# Patient Record
Sex: Female | Born: 1970
Health system: Southern US, Community
[De-identification: ages and names within clinical notes are randomized; demographics above are authoritative.]

## PROBLEM LIST (undated history)

## (undated) DIAGNOSIS — J020 Streptococcal pharyngitis: Secondary | ICD-10-CM

## (undated) DIAGNOSIS — F329 Major depressive disorder, single episode, unspecified: Secondary | ICD-10-CM

## (undated) DIAGNOSIS — F419 Anxiety disorder, unspecified: Secondary | ICD-10-CM

## (undated) DIAGNOSIS — F32A Depression, unspecified: Secondary | ICD-10-CM

## (undated) DIAGNOSIS — K219 Gastro-esophageal reflux disease without esophagitis: Secondary | ICD-10-CM

## (undated) DIAGNOSIS — F1310 Sedative, hypnotic or anxiolytic abuse, uncomplicated: Secondary | ICD-10-CM

## (undated) DIAGNOSIS — M1990 Unspecified osteoarthritis, unspecified site: Secondary | ICD-10-CM

## (undated) DIAGNOSIS — T7840XA Allergy, unspecified, initial encounter: Secondary | ICD-10-CM

## (undated) DIAGNOSIS — I499 Cardiac arrhythmia, unspecified: Secondary | ICD-10-CM

## (undated) DIAGNOSIS — G709 Myoneural disorder, unspecified: Secondary | ICD-10-CM

## (undated) DIAGNOSIS — M797 Fibromyalgia: Secondary | ICD-10-CM

## (undated) HISTORY — PX: ABDOMINAL HYSTERECTOMY: SHX81

## (undated) HISTORY — DX: Streptococcal pharyngitis: J02.0

## (undated) HISTORY — DX: Depression, unspecified: F32.A

## (undated) HISTORY — DX: Myoneural disorder, unspecified: G70.9

## (undated) HISTORY — DX: Anxiety disorder, unspecified: F41.9

## (undated) HISTORY — DX: Allergy, unspecified, initial encounter: T78.40XA

## (undated) HISTORY — DX: Gastro-esophageal reflux disease without esophagitis: K21.9

## (undated) HISTORY — DX: Major depressive disorder, single episode, unspecified: F32.9

## (undated) HISTORY — PX: BACK SURGERY: SHX140

## (undated) HISTORY — PX: TUBAL LIGATION: SHX77

## (undated) HISTORY — PX: APPENDECTOMY: SHX54

## (undated) HISTORY — DX: Unspecified osteoarthritis, unspecified site: M19.90

---

## 2004-10-12 ENCOUNTER — Ambulatory Visit: Payer: Self-pay | Admitting: Unknown Physician Specialty

## 2005-02-13 ENCOUNTER — Ambulatory Visit: Payer: Self-pay | Admitting: Unknown Physician Specialty

## 2005-12-08 ENCOUNTER — Emergency Department: Payer: Self-pay | Admitting: Emergency Medicine

## 2005-12-14 ENCOUNTER — Ambulatory Visit: Payer: Self-pay | Admitting: General Surgery

## 2005-12-20 ENCOUNTER — Ambulatory Visit: Payer: Self-pay | Admitting: Unknown Physician Specialty

## 2006-01-13 ENCOUNTER — Ambulatory Visit: Payer: Self-pay

## 2008-10-12 ENCOUNTER — Ambulatory Visit: Payer: Self-pay | Admitting: Unknown Physician Specialty

## 2008-10-21 ENCOUNTER — Ambulatory Visit: Payer: Self-pay | Admitting: Unknown Physician Specialty

## 2009-03-04 ENCOUNTER — Ambulatory Visit: Payer: Self-pay | Admitting: Maternal and Fetal Medicine

## 2009-03-04 ENCOUNTER — Ambulatory Visit: Payer: Self-pay | Admitting: Oncology

## 2009-03-14 ENCOUNTER — Ambulatory Visit: Payer: Self-pay | Admitting: Maternal and Fetal Medicine

## 2009-03-14 ENCOUNTER — Ambulatory Visit: Payer: Self-pay | Admitting: Oncology

## 2009-07-04 ENCOUNTER — Ambulatory Visit: Payer: Self-pay | Admitting: Unknown Physician Specialty

## 2009-08-08 ENCOUNTER — Ambulatory Visit: Payer: Self-pay | Admitting: Unknown Physician Specialty

## 2009-08-11 ENCOUNTER — Ambulatory Visit: Payer: Self-pay | Admitting: Unknown Physician Specialty

## 2009-09-15 ENCOUNTER — Ambulatory Visit: Payer: Self-pay | Admitting: Unknown Physician Specialty

## 2009-09-26 ENCOUNTER — Ambulatory Visit: Payer: Self-pay | Admitting: Unknown Physician Specialty

## 2010-12-07 ENCOUNTER — Ambulatory Visit: Payer: Self-pay | Admitting: Unknown Physician Specialty

## 2011-01-17 ENCOUNTER — Ambulatory Visit: Payer: Self-pay | Admitting: Podiatry

## 2011-08-24 ENCOUNTER — Ambulatory Visit: Payer: Self-pay | Admitting: Rheumatology

## 2012-04-18 DIAGNOSIS — M797 Fibromyalgia: Secondary | ICD-10-CM | POA: Insufficient documentation

## 2013-06-03 DIAGNOSIS — F32A Depression, unspecified: Secondary | ICD-10-CM | POA: Insufficient documentation

## 2013-06-03 DIAGNOSIS — F329 Major depressive disorder, single episode, unspecified: Secondary | ICD-10-CM | POA: Insufficient documentation

## 2013-06-17 DIAGNOSIS — M792 Neuralgia and neuritis, unspecified: Secondary | ICD-10-CM | POA: Insufficient documentation

## 2013-07-02 ENCOUNTER — Emergency Department: Payer: Self-pay | Admitting: Emergency Medicine

## 2013-07-02 LAB — LIPASE, BLOOD: Lipase: 216 U/L (ref 73–393)

## 2013-07-02 LAB — COMPREHENSIVE METABOLIC PANEL
ALBUMIN: 3.6 g/dL (ref 3.4–5.0)
ALT: 120 U/L — AB (ref 12–78)
AST: 86 U/L — AB (ref 15–37)
Alkaline Phosphatase: 132 U/L — ABNORMAL HIGH
Anion Gap: 7 (ref 7–16)
BUN: 3 mg/dL — ABNORMAL LOW (ref 7–18)
Bilirubin,Total: 0.5 mg/dL (ref 0.2–1.0)
CHLORIDE: 102 mmol/L (ref 98–107)
Calcium, Total: 8.7 mg/dL (ref 8.5–10.1)
Co2: 30 mmol/L (ref 21–32)
Creatinine: 0.95 mg/dL (ref 0.60–1.30)
EGFR (African American): >60
EGFR (Non-African Amer.): >60
GLUCOSE: 135 mg/dL — AB (ref 65–99)
OSMOLALITY: 278 (ref 275–301)
Potassium: 2.8 mmol/L — ABNORMAL LOW (ref 3.5–5.1)
Sodium: 139 mmol/L (ref 136–145)
Total Protein: 7.4 g/dL (ref 6.4–8.2)

## 2013-07-02 LAB — CBC WITH DIFFERENTIAL/PLATELET
Basophil #: 0.1 10*3/uL (ref 0.0–0.1)
Basophil %: 0.8 %
Eosinophil #: 0.4 10*3/uL (ref 0.0–0.7)
Eosinophil %: 4.3 %
HCT: 38.5 % (ref 35.0–47.0)
HGB: 13 g/dL (ref 12.0–16.0)
Lymphocyte #: 3 10*3/uL (ref 1.0–3.6)
Lymphocyte %: 34.1 %
MCH: 30.4 pg (ref 26.0–34.0)
MCHC: 33.7 g/dL (ref 32.0–36.0)
MCV: 90 fL (ref 80–100)
Monocyte #: 1.1 x10 3/mm — ABNORMAL HIGH (ref 0.2–0.9)
Monocyte %: 12.2 %
NEUTROS PCT: 48.6 %
Neutrophil #: 4.3 10*3/uL (ref 1.4–6.5)
Platelet: 229 10*3/uL (ref 150–440)
RBC: 4.27 10*6/uL (ref 3.80–5.20)
RDW: 13 % (ref 11.5–14.5)
WBC: 8.8 10*3/uL (ref 3.6–11.0)

## 2013-07-02 LAB — URINALYSIS, COMPLETE
BLOOD: NEGATIVE
Bacteria: NONE SEEN
Bilirubin,UR: NEGATIVE
Glucose,UR: NEGATIVE mg/dL (ref 0–75)
KETONE: NEGATIVE
NITRITE: NEGATIVE
Ph: 6 (ref 4.5–8.0)
Protein: NEGATIVE
RBC,UR: 2 /HPF (ref 0–5)
Specific Gravity: 1.006 (ref 1.003–1.030)
Squamous Epithelial: 9
WBC UR: 4 /HPF (ref 0–5)

## 2013-07-02 LAB — TROPONIN I: Troponin-I: <0.02 ng/mL

## 2013-07-03 ENCOUNTER — Ambulatory Visit: Payer: Self-pay | Admitting: Orthopaedic Surgery

## 2013-07-06 DIAGNOSIS — S99929A Unspecified injury of unspecified foot, initial encounter: Secondary | ICD-10-CM | POA: Insufficient documentation

## 2013-07-08 ENCOUNTER — Other Ambulatory Visit: Payer: Self-pay | Admitting: Unknown Physician Specialty

## 2013-07-08 LAB — CLOSTRIDIUM DIFFICILE(ARMC)

## 2013-07-11 LAB — STOOL CULTURE

## 2013-07-14 DIAGNOSIS — G629 Polyneuropathy, unspecified: Secondary | ICD-10-CM | POA: Insufficient documentation

## 2013-07-16 ENCOUNTER — Ambulatory Visit: Payer: Self-pay | Admitting: Unknown Physician Specialty

## 2013-08-11 DIAGNOSIS — N809 Endometriosis, unspecified: Secondary | ICD-10-CM | POA: Insufficient documentation

## 2013-08-11 DIAGNOSIS — F4310 Post-traumatic stress disorder, unspecified: Secondary | ICD-10-CM | POA: Insufficient documentation

## 2013-08-11 DIAGNOSIS — K589 Irritable bowel syndrome without diarrhea: Secondary | ICD-10-CM | POA: Insufficient documentation

## 2013-09-07 DIAGNOSIS — M545 Low back pain, unspecified: Secondary | ICD-10-CM | POA: Insufficient documentation

## 2014-01-28 DIAGNOSIS — M47816 Spondylosis without myelopathy or radiculopathy, lumbar region: Secondary | ICD-10-CM | POA: Insufficient documentation

## 2014-05-04 DIAGNOSIS — R000 Tachycardia, unspecified: Secondary | ICD-10-CM | POA: Insufficient documentation

## 2014-05-06 DIAGNOSIS — R0681 Apnea, not elsewhere classified: Secondary | ICD-10-CM | POA: Insufficient documentation

## 2014-11-17 ENCOUNTER — Other Ambulatory Visit: Payer: Self-pay | Admitting: Orthopedic Surgery

## 2014-11-17 DIAGNOSIS — M533 Sacrococcygeal disorders, not elsewhere classified: Principal | ICD-10-CM

## 2014-11-17 DIAGNOSIS — G8929 Other chronic pain: Secondary | ICD-10-CM

## 2014-11-29 ENCOUNTER — Inpatient Hospital Stay: Admission: RE | Admit: 2014-11-29 | Payer: Self-pay | Source: Ambulatory Visit

## 2014-12-07 ENCOUNTER — Ambulatory Visit
Admission: RE | Admit: 2014-12-07 | Discharge: 2014-12-07 | Disposition: A | Discharge status: Home / Self Care | Payer: Worker's Compensation | Source: Ambulatory Visit | Attending: Orthopedic Surgery | Admitting: Orthopedic Surgery

## 2014-12-07 DIAGNOSIS — G8929 Other chronic pain: Secondary | ICD-10-CM

## 2014-12-07 DIAGNOSIS — M533 Sacrococcygeal disorders, not elsewhere classified: Principal | ICD-10-CM

## 2014-12-07 MED ORDER — METHYLPREDNISOLONE ACETATE 40 MG/ML INJ SUSP (RADIOLOG
120.0000 mg | Freq: Once | INTRAMUSCULAR | Status: AC
  Administered 2014-12-07: 120 mg via INTRA_ARTICULAR

## 2014-12-21 ENCOUNTER — Ambulatory Visit: Payer: BLUE CROSS/BLUE SHIELD | Attending: Pain Medicine | Admitting: Pain Medicine

## 2014-12-21 ENCOUNTER — Encounter: Payer: Self-pay | Admitting: Pain Medicine

## 2014-12-21 ENCOUNTER — Encounter (INDEPENDENT_AMBULATORY_CARE_PROVIDER_SITE_OTHER): Payer: Self-pay

## 2014-12-21 VITALS — BP 129/85 | HR 100 | Temp 98.3°F | Resp 16 | Ht 62.0 in | Wt 128.0 lb

## 2014-12-21 DIAGNOSIS — F419 Anxiety disorder, unspecified: Secondary | ICD-10-CM | POA: Diagnosis not present

## 2014-12-21 DIAGNOSIS — M5416 Radiculopathy, lumbar region: Secondary | ICD-10-CM | POA: Insufficient documentation

## 2014-12-21 DIAGNOSIS — K589 Irritable bowel syndrome without diarrhea: Secondary | ICD-10-CM | POA: Diagnosis not present

## 2014-12-21 DIAGNOSIS — G629 Polyneuropathy, unspecified: Secondary | ICD-10-CM

## 2014-12-21 DIAGNOSIS — M797 Fibromyalgia: Secondary | ICD-10-CM | POA: Diagnosis not present

## 2014-12-21 DIAGNOSIS — M419 Scoliosis, unspecified: Secondary | ICD-10-CM | POA: Diagnosis not present

## 2014-12-21 DIAGNOSIS — M5126 Other intervertebral disc displacement, lumbar region: Secondary | ICD-10-CM | POA: Diagnosis not present

## 2014-12-21 DIAGNOSIS — F17200 Nicotine dependence, unspecified, uncomplicated: Secondary | ICD-10-CM | POA: Insufficient documentation

## 2014-12-21 DIAGNOSIS — M47816 Spondylosis without myelopathy or radiculopathy, lumbar region: Secondary | ICD-10-CM

## 2014-12-21 DIAGNOSIS — K219 Gastro-esophageal reflux disease without esophagitis: Secondary | ICD-10-CM | POA: Diagnosis not present

## 2014-12-21 DIAGNOSIS — M51369 Other intervertebral disc degeneration, lumbar region without mention of lumbar back pain or lower extremity pain: Secondary | ICD-10-CM | POA: Insufficient documentation

## 2014-12-21 DIAGNOSIS — M79604 Pain in right leg: Secondary | ICD-10-CM | POA: Diagnosis present

## 2014-12-21 DIAGNOSIS — M545 Low back pain: Secondary | ICD-10-CM | POA: Diagnosis present

## 2014-12-21 DIAGNOSIS — R5382 Chronic fatigue, unspecified: Secondary | ICD-10-CM | POA: Insufficient documentation

## 2014-12-21 DIAGNOSIS — M533 Sacrococcygeal disorders, not elsewhere classified: Secondary | ICD-10-CM | POA: Diagnosis not present

## 2014-12-21 DIAGNOSIS — M5136 Other intervertebral disc degeneration, lumbar region: Secondary | ICD-10-CM

## 2014-12-21 DIAGNOSIS — M79605 Pain in left leg: Secondary | ICD-10-CM | POA: Diagnosis present
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
## 2014-12-21 NOTE — Progress Notes (Signed)
Discharged to home , ambulatory.  Pre procedure instructions given with teach back 3 done. 

## 2014-12-21 NOTE — Progress Notes (Signed)
Safety precautions to be maintained throughout the outpatient stay will include: orient to surroundings, keep bed in low position, maintain call bell within reach at all times, provide assistance with transfer out of bed and ambulation.  

## 2014-12-21 NOTE — Patient Instructions (Addendum)
Continue present medications  Lumbar facet, medial branch nerve, blocks to be performed at time of return appointment  F/U PCP for evaliation of  BP and general medical  condition  F/U surgical evaluation. We will consider surgical evaluation pending further evaluation and response to treatment  F/U neurological evaluation  May consider radiofrequency rhizolysis or intraspinal procedures pending response to present treatment and F/U evaluation as discussed we will consider performing radiofrequency rhizolysis of the lumbar facets, medial branch nerves.  Patient to call Pain Management Center should patient have concerns prior to scheduled return appointmen. GENERAL RISKS AND COMPLICATIONS  What are the risk, side effects and possible complications? Generally speaking, most procedures are safe.  However, with any procedure there are risks, side effects, and the possibility of complications.  The risks and complications are dependent upon the sites that are lesioned, or the type of nerve block to be performed.  The closer the procedure is to the spine, the more serious the risks are.  Great care is taken when placing the radio frequency needles, block needles or lesioning probes, but sometimes complications can occur. 1. Infection: Any time there is an injection through the skin, there is a risk of infection.  This is why sterile conditions are used for these blocks.  There are four possible types of infection. 1. Localized skin infection. 2. Central Nervous System Infection-This can be in the form of Meningitis, which can be deadly. 3. Epidural Infections-This can be in the form of an epidural abscess, which can cause pressure inside of the spine, causing compression of the spinal cord with subsequent paralysis. This would require an emergency surgery to decompress, and there are no guarantees that the patient would recover from the paralysis. 4. Discitis-This is an infection of the intervertebral  discs.  It occurs in about 1% of discography procedures.  It is difficult to treat and it may lead to surgery.        2. Pain: the needles have to go through skin and soft tissues, will cause soreness.       3. Damage to internal structures:  The nerves to be lesioned may be near blood vessels or    other nerves which can be potentially damaged.       4. Bleeding: Bleeding is more common if the patient is taking blood thinners such as  aspirin, Coumadin, Ticiid, Plavix, etc., or if he/she have some genetic predisposition  such as hemophilia. Bleeding into the spinal canal can cause compression of the spinal  cord with subsequent paralysis.  This would require an emergency surgery to  decompress and there are no guarantees that the patient would recover from the  paralysis.       5. Pneumothorax:  Puncturing of a lung is a possibility, every time a needle is introduced in  the area of the chest or upper back.  Pneumothorax refers to free air around the  collapsed lung(s), inside of the thoracic cavity (chest cavity).  Another two possible  complications related to a similar event would include: Hemothorax and Chylothorax.   These are variations of the Pneumothorax, where instead of air around the collapsed  lung(s), you may have blood or chyle, respectively.       6. Spinal headaches: They may occur with any procedures in the area of the spine.       7. Persistent CSF (Cerebro-Spinal Fluid) leakage: This is a rare problem, but may occur  with prolonged intrathecal or epidural catheters either  due to the formation of a fistulous  track or a dural tear.       8. Nerve damage: By working so close to the spinal cord, there is always a possibility of  nerve damage, which could be as serious as a permanent spinal cord injury with  paralysis.       9. Death:  Although rare, severe deadly allergic reactions known as "Anaphylactic  reaction" can occur to any of the medications used.      10. Worsening of the  symptoms:  We can always make thing worse.  What are the chances of something like this happening? Chances of any of this occuring are extremely low.  By statistics, you have more of a chance of getting killed in a motor vehicle accident: while driving to the hospital than any of the above occurring .  Nevertheless, you should be aware that they are possibilities.  In general, it is similar to taking a shower.  Everybody knows that you can slip, hit your head and get killed.  Does that mean that you should not shower again?  Nevertheless always keep in mind that statistics do not mean anything if you happen to be on the wrong side of them.  Even if a procedure has a 1 (one) in a 1,000,000 (million) chance of going wrong, it you happen to be that one..Also, keep in mind that by statistics, you have more of a chance of having something go wrong when taking medications.  Who should not have this procedure? If you are on a blood thinning medication (e.g. Coumadin, Plavix, see list of "Blood Thinners"), or if you have an active infection going on, you should not have the procedure.  If you are taking any blood thinners, please inform your physician.  How should I prepare for this procedure?  Do not eat or drink anything at least six hours prior to the procedure.  Bring a driver with you .  It cannot be a taxi.  Come accompanied by an adult that can drive you back, and that is strong enough to help you if your legs get weak or numb from the local anesthetic.  Take all of your medicines the morning of the procedure with just enough water to swallow them.  If you have diabetes, make sure that you are scheduled to have your procedure done first thing in the morning, whenever possible.  If you have diabetes, take only half of your insulin dose and notify our nurse that you have done so as soon as you arrive at the clinic.  If you are diabetic, but only take blood sugar pills (oral hypoglycemic), then do  not take them on the morning of your procedure.  You may take them after you have had the procedure.  Do not take aspirin or any aspirin-containing medications, at least eleven (11) days prior to the procedure.  They may prolong bleeding.  Wear loose fitting clothing that may be easy to take off and that you would not mind if it got stained with Betadine or blood.  Do not wear any jewelry or perfume  Remove any nail coloring.  It will interfere with some of our monitoring equipment.  NOTE: Remember that this is not meant to be interpreted as a complete list of all possible complications.  Unforeseen problems may occur.  BLOOD THINNERS The following drugs contain aspirin or other products, which can cause increased bleeding during surgery and should not be taken for 2  weeks prior to and 1 week after surgery.  If you should need take something for relief of minor pain, you may take acetaminophen which is found in Tylenol,m Datril, Anacin-3 and Panadol. It is not blood thinner. The products listed below are.  Do not take any of the products listed below in addition to any listed on your instruction sheet.  A.P.C or A.P.C with Codeine Codeine Phosphate Capsules #3 Ibuprofen Ridaura  ABC compound Congesprin Imuran rimadil  Advil Cope Indocin Robaxisal  Alka-Seltzer Effervescent Pain Reliever and Antacid Coricidin or Coricidin-D  Indomethacin Rufen  Alka-Seltzer plus Cold Medicine Cosprin Ketoprofen S-A-C Tablets  Anacin Analgesic Tablets or Capsules Coumadin Korlgesic Salflex  Anacin Extra Strength Analgesic tablets or capsules CP-2 Tablets Lanoril Salicylate  Anaprox Cuprimine Capsules Levenox Salocol  Anexsia-D Dalteparin Magan Salsalate  Anodynos Darvon compound Magnesium Salicylate Sine-off  Ansaid Dasin Capsules Magsal Sodium Salicylate  Anturane Depen Capsules Marnal Soma  APF Arthritis pain formula Dewitt's Pills Measurin Stanback  Argesic Dia-Gesic Meclofenamic Sulfinpyrazone   Arthritis Bayer Timed Release Aspirin Diclofenac Meclomen Sulindac  Arthritis pain formula Anacin Dicumarol Medipren Supac  Analgesic (Safety coated) Arthralgen Diffunasal Mefanamic Suprofen  Arthritis Strength Bufferin Dihydrocodeine Mepro Compound Suprol  Arthropan liquid Dopirydamole Methcarbomol with Aspirin Synalgos  ASA tablets/Enseals Disalcid Micrainin Tagament  Ascriptin Doan's Midol Talwin  Ascriptin A/D Dolene Mobidin Tanderil  Ascriptin Extra Strength Dolobid Moblgesic Ticlid  Ascriptin with Codeine Doloprin or Doloprin with Codeine Momentum Tolectin  Asperbuf Duoprin Mono-gesic Trendar  Aspergum Duradyne Motrin or Motrin IB Triminicin  Aspirin plain, buffered or enteric coated Durasal Myochrisine Trigesic  Aspirin Suppositories Easprin Nalfon Trillsate  Aspirin with Codeine Ecotrin Regular or Extra Strength Naprosyn Uracel  Atromid-S Efficin Naproxen Ursinus  Auranofin Capsules Elmiron Neocylate Vanquish  Axotal Emagrin Norgesic Verin  Azathioprine Empirin or Empirin with Codeine Normiflo Vitamin E  Azolid Emprazil Nuprin Voltaren  Bayer Aspirin plain, buffered or children's or timed BC Tablets or powders Encaprin Orgaran Warfarin Sodium  Buff-a-Comp Enoxaparin Orudis Zorpin  Buff-a-Comp with Codeine Equegesic Os-Cal-Gesic   Buffaprin Excedrin plain, buffered or Extra Strength Oxalid   Bufferin Arthritis Strength Feldene Oxphenbutazone   Bufferin plain or Extra Strength Feldene Capsules Oxycodone with Aspirin   Bufferin with Codeine Fenoprofen Fenoprofen Pabalate or Pabalate-SF   Buffets II Flogesic Panagesic   Buffinol plain or Extra Strength Florinal or Florinal with Codeine Panwarfarin   Buf-Tabs Flurbiprofen Penicillamine   Butalbital Compound Four-way cold tablets Penicillin   Butazolidin Fragmin Pepto-Bismol   Carbenicillin Geminisyn Percodan   Carna Arthritis Reliever Geopen Persantine   Carprofen Gold's salt Persistin   Chloramphenicol Goody's Phenylbutazone    Chloromycetin Haltrain Piroxlcam   Clmetidine heparin Plaquenil   Cllnoril Hyco-pap Ponstel   Clofibrate Hydroxy chloroquine Propoxyphen         Before stopping any of these medications, be sure to consult the physician who ordered them.  Some, such as Coumadin (Warfarin) are ordered to prevent or treat serious conditions such as "deep thrombosis", "pumonary embolisms", and other heart problems.  The amount of time that you may need off of the medication may also vary with the medication and the reason for which you were taking it.  If you are taking any of these medications, please make sure you notify your pain physician before you undergo any procedures.         Facet Joint Block The facet joints connect the bones of the spine (vertebrae). They make it possible for you to bend, twist, and make other  movements with your spine. They also prevent you from overbending, overtwisting, and making other excessive movements.  A facet joint block is a procedure where a numbing medicine (anesthetic) is injected into a facet joint. Often, a type of anti-inflammatory medicine called a steroid is also injected. A facet joint block may be done for two reasons:  2. Diagnosis. A facet joint block may be done as a test to see whether neck or back pain is caused by a worn-down or infected facet joint. If the pain gets better after a facet joint block, it means the pain is probably coming from the facet joint. If the pain does not get better, it means the pain is probably not coming from the facet joint.  3. Therapy. A facet joint block may be done to relieve neck or back pain caused by a facet joint. A facet joint block is only done as a therapy if the pain does not improve with medicine, exercise programs, physical therapy, and other forms of pain management. LET [facility] CARE PROVIDER KNOW ABOUT:   Any allergies you have.   All medicines you are taking, including vitamins, herbs, eyedrops, and  over-the-counter medicines and creams.   Previous problems you or members of your family have had with the use of anesthetics.   Any blood disorders you have had.   Other health problems you have. RISKS AND COMPLICATIONS Generally, having a facet joint block is safe. However, as with any procedure, complications can occur. Possible complications associated with having a facet joint block include:   Bleeding.   Injury to a nerve near the injection site.   Pain at the injection site.   Weakness or numbness in areas controlled by nerves near the injection site.   Infection.   Temporary fluid retention.   Allergic reaction to anesthetics or medicines used during the procedure. BEFORE THE PROCEDURE   Follow your health care provider's instructions if you are taking dietary supplements or medicines. You may need to stop taking them or reduce your dosage.   Do not take any new dietary supplements or medicines without asking your health care provider first.   Follow your health care provider's instructions about eating and drinking before the procedure. You may need to stop eating and drinking several hours before the procedure.   Arrange to have an adult drive you home after the procedure. PROCEDURE 12. You may need to remove your clothing and dress in an open-back gown so that your health care provider can access your spine.  13. The procedure will be done while you are lying on an X-ray table. Most of the time you will be asked to lie on your stomach, but you may be asked to lie in a different position if an injection will be made in your neck.  14. Special machines will be used to monitor your oxygen levels, heart rate, and blood pressure.  15. If an injection will be made in your neck, an intravenous (IV) tube will be inserted into one of your veins. Fluids and medicine will flow directly into your body through the IV tube.  16. The area over the facet joint where  the injection will be made will be cleaned with an antiseptic soap. The surrounding skin will be covered with sterile drapes.  17. An anesthetic will be applied to your skin to make the injection area numb. You may feel a temporary stinging or burning sensation.  18. A video X-ray machine will be used  to locate the joint. A contrast dye may be injected into the facet joint area to help with locating the joint.  19. When the joint is located, an anesthetic medicine will be injected into the joint through the needle.  20. Your health care provider will ask you whether you feel pain relief. If you do feel relief, a steroid may be injected to provide pain relief for a longer period of time. If you do not feel relief or feel only partial relief, additional injections of an anesthetic may be made in other facet joints.  21. The needle will be removed, the skin will be cleansed, and bandages will be applied.  AFTER THE PROCEDURE   You will be observed for 15-30 minutes before being allowed to go home. Do not drive. Have an adult drive you or take a taxi or public transportation instead.   If you feel pain relief, the pain will return in several hours or days when the anesthetic wears off.   You may feel pain relief 2-14 days after the procedure. The amount of time this relief lasts varies from person to person.   It is normal to feel some tenderness over the injected area(s) for 2 days following the procedure.   If you have diabetes, you may have a temporary increase in blood sugar. Document Released: 09/19/2006 Document Revised: 09/14/2013 Document Reviewed: 02/18/2012 [facility] Patient Information 2015 [place], [place]. This information is not intended to replace advice given to you by your health care provider. Make sure you discuss any questions you have with your health care provider.

## 2015-01-03 ENCOUNTER — Ambulatory Visit: Payer: BLUE CROSS/BLUE SHIELD | Attending: Pain Medicine | Admitting: Pain Medicine

## 2015-01-03 ENCOUNTER — Encounter: Payer: Self-pay | Admitting: Pain Medicine

## 2015-01-03 VITALS — BP 118/74 | HR 92 | Temp 98.4°F | Resp 18 | Ht 62.0 in | Wt 128.0 lb

## 2015-01-03 DIAGNOSIS — M79605 Pain in left leg: Secondary | ICD-10-CM | POA: Diagnosis present

## 2015-01-03 DIAGNOSIS — M545 Low back pain: Secondary | ICD-10-CM | POA: Diagnosis present

## 2015-01-03 DIAGNOSIS — M533 Sacrococcygeal disorders, not elsewhere classified: Secondary | ICD-10-CM

## 2015-01-03 DIAGNOSIS — M47816 Spondylosis without myelopathy or radiculopathy, lumbar region: Secondary | ICD-10-CM

## 2015-01-03 DIAGNOSIS — M5126 Other intervertebral disc displacement, lumbar region: Secondary | ICD-10-CM | POA: Diagnosis not present

## 2015-01-03 DIAGNOSIS — M5136 Other intervertebral disc degeneration, lumbar region: Secondary | ICD-10-CM | POA: Diagnosis not present

## 2015-01-03 DIAGNOSIS — M5416 Radiculopathy, lumbar region: Secondary | ICD-10-CM

## 2015-01-03 DIAGNOSIS — M79604 Pain in right leg: Secondary | ICD-10-CM | POA: Diagnosis present

## 2015-01-03 MED ORDER — KETOROLAC TROMETHAMINE 60 MG/2ML IM SOLN
INTRAMUSCULAR | Status: AC
  Administered 2015-01-03: 14:00:00
  Filled 2015-01-03: qty 2

## 2015-01-03 MED ORDER — MIDAZOLAM HCL 5 MG/5ML IJ SOLN
INTRAMUSCULAR | Status: AC
  Administered 2015-01-03: 5 mg via INTRAVENOUS
  Filled 2015-01-03: qty 5

## 2015-01-03 MED ORDER — TRIAMCINOLONE ACETONIDE 40 MG/ML IJ SUSP
INTRAMUSCULAR | Status: AC
  Administered 2015-01-03: 13:00:00
  Filled 2015-01-03: qty 1

## 2015-01-03 MED ORDER — ORPHENADRINE CITRATE 30 MG/ML IJ SOLN
INTRAMUSCULAR | Status: AC
  Administered 2015-01-03: 13:00:00
  Filled 2015-01-03: qty 2

## 2015-01-03 MED ORDER — FENTANYL CITRATE (PF) 100 MCG/2ML IJ SOLN
INTRAMUSCULAR | Status: AC
  Administered 2015-01-03: 100 ug via INTRAVENOUS
  Filled 2015-01-03: qty 2

## 2015-01-03 MED ORDER — BUPIVACAINE HCL (PF) 0.5 % IJ SOLN
INTRAMUSCULAR | Status: AC
Start: 2015-01-03 — End: 2015-01-03
  Administered 2015-01-03: 14:00:00
  Filled 2015-01-03: qty 30

## 2015-01-03 MED ORDER — BUPIVACAINE HCL (PF) 0.25 % IJ SOLN
INTRAMUSCULAR | Status: AC
  Administered 2015-01-03: 13:00:00
  Filled 2015-01-03: qty 30

## 2015-01-03 NOTE — Progress Notes (Signed)
Subjective:    Patient ID: Monique Castro, female    DOB: 10/29/1970, 44 y.o.   MRN: [id number]  HPI  PROCEDURE PERFORMED: Lumbar facet (medial branch block)   NOTE: The patient is a 44 y.o. female who [facility] for further evaluation and treatment of pain involving the lumbar and lower extremity region.  MRI  revealed the patient to be with evidence of degenerative disc disease lumbar spine Small focal extraforaminal disc protrusion on the left at L4-5 potentially irritating the left L4 nerve root. Focal central disc protrusion at L5-S1 possibly irritating both S1 nerve roots. Period there is concern regarding significant component of patient's pain being due to facet arthropathy with facet syndrome The risks, benefits, and expectations of the procedure have been discussed and explained to the patient who was understanding and in agreement with suggested treatment plan. We will proceed with interventional treatment as discussed and as explained to the patient who was understanding and wished to proceed with procedure as planned.   DESCRIPTION OF PROCEDURE: Lumbar facet (medial branch block) with IV Versed, IV fentanyl conscious sedation, EKG, blood pressure, pulse, and pulse oximetry monitoring. The procedure was performed with the patient in the prone position. Betadine prep of proposed entry site performed.   NEEDLE PLACEMENT AT:  left L  3 lumbar facet (medial branch block). Under fluoroscopic guidance with oblique orientation of 15 degrees, a 22-gauge needle was inserted at the L  3 vertebral body level with needle placed at the targeted area of Burton's Eye or Eye of the Scotty Dog with documentation of needle placement in the superior and lateral border of targeted area of Burton's Eye or Eye of the Scotty Dog with oblique orientation of 15 degrees. Following documentation of needle placement at the L  3 vertebral body level, needle placement was then accomplished at  the L  4 vertebral body level.   NEEDLE PLACEMENT AT  L4 and L5 VERTEBRAL BODY LEVELS ON THE LEFT SIDE The procedure was performed at the  L4 and L5 vertebral body levels exactly as was performed at the L  3 vertebral body level utilizing the same technique and under fluoroscopic guidance.  NEEDLE PLACEMENT AT THE SACRAL ALA with AP view of the lumbosacral spine. With the patient in the prone position, Betadine prep of proposed entry site accomplished, a 22 gauge needle was inserted in the region of the sacral ala (groove formed by the superior articulating process of S1 and the sacral wing). Following documentation of needle placement at the sacral ala,  needle placement was then accomplished at the S1 foramen level.   NEEDLE PLACEMENT AT THE S1 FORAMEN LEVEL under fluoroscopic guidance with AP view of the lumbosacral spine and cephalad orientation of the fluoroscope, a 22-gauge needle was placed at the superior and lateral border of the S1 foramen under fluoroscopic guidance. Following documentation of needle placement at the S1 foramen.   Needle placement was then verified at all levels on lateral view. Following documentation of needle placement at all levels on lateral view and following negative aspiration for heme and CSF, each level was injected with 1 mL of 0.25% bupivacaine with Kenalog.     LUMBAR FACET, MEDIAL BRANCH NERVE, BLOCKS PERFORMED ON THE RIGHT SIDE   The procedure was performed on the right side exactly as was performed on the left side at the same levels and utilizing the same technique under fluoroscopic guidance.     The patient tolerated the  procedure well. A total of 40 mg of Kenalog was utilized for the procedure.   PLAN:  1. Medications: The patient will continue presently prescribed medications. 2. May consider modification of treatment regimen at time of return appointment pending response to treatment rendered on today's visit. 3. The patient is to follow-up  with primary care physician   Dr.Thies for further evaluation of blood pressure and general medical condition status post steroid injection performed on today's visit. 4. Surgical follow-up evaluation. 5. Neurological follow-up evaluation. 6. The patient may be candidate for radiofrequency procedures, and other treatment pending response to treatment and follow-up evaluation.. At the present time we will request insurance approval for radiofrequency rhizolysis lumbar facets, medial branch nerve root 7. The patient has been advised to call the [facility] prior to scheduled return appointment should there be significant change in condition or should patient have other concerns regarding condition prior to scheduled return appointment.  The patient is understanding and in agreement with suggested treatment plan.     Review of Systems     Objective:   Physical Exam        Assessment & Plan:

## 2015-01-03 NOTE — Progress Notes (Signed)
Safety precautions to be maintained throughout the outpatient stay will include: orient to surroundings, keep bed in low position, maintain call bell within reach at all times, provide assistance with transfer out of bed and ambulation.  

## 2015-01-03 NOTE — Patient Instructions (Addendum)
Continue present medications at this time  F/U PCP  Thies for evaliation of  BP and general medical  condition  F/U surgical evaluation  F/U neurological evaluation  May consider radiofrequency rhizolysis or intraspinal procedures pending response to present treatment and F/U evaluation   Please ask Morrie Sheldon and Angie to request insurance approval for radiofrequency lumbar facet, medial branch nerve.  Patient to call [facility] should patient have concerns prior to scheduled return appointmen. Pain Management Discharge Instructions  General Discharge Instructions :  If you need to reach your doctor call: Monday-Friday 8:00 am - 4:00 pm at [phone] or toll free [phone].  After clinic hours [phone] to have operator reach doctor.  Bring all of your medication bottles to all your appointments in the pain clinic.  To cancel or reschedule your appointment with Pain Management please remember to call 24 hours in advance to avoid a fee.  Refer to the educational materials which you have been given on: General Risks, I had my Procedure. Discharge Instructions, Post Sedation.  Post Procedure Instructions:  The drugs you were given will stay in your system until tomorrow, so for the next 24 hours you should not drive, make any legal decisions or drink any alcoholic beverages.  You may eat anything you prefer, but it is better to start with liquids then soups and crackers, and gradually work up to solid foods.  Please notify your doctor immediately if you have any unusual bleeding, trouble breathing or pain that is not related to your normal pain.  Depending on the type of procedure that was done, some parts of your body may feel week and/or numb.  This usually clears up by tonight or the next day.  Walk with the use of an assistive device or accompanied by an adult for the 24 hours.  You may use ice on the affected area for the first 24 hours.  Put ice in a Ziploc  bag and cover with a towel and place against area 15 minutes on 15 minutes off.  You may switch to heat after 24 hours.GENERAL RISKS AND COMPLICATIONS  What are the risk, side effects and possible complications? Generally speaking, most procedures are safe.  However, with any procedure there are risks, side effects, and the possibility of complications.  The risks and complications are dependent upon the sites that are lesioned, or the type of nerve block to be performed.  The closer the procedure is to the spine, the more serious the risks are.  Great care is taken when placing the radio frequency needles, block needles or lesioning probes, but sometimes complications can occur. 1. Infection: Any time there is an injection through the skin, there is a risk of infection.  This is why sterile conditions are used for these blocks.  There are four possible types of infection. 1. Localized skin infection. 2. Central Nervous System Infection-This can be in the form of Meningitis, which can be deadly. 3. Epidural Infections-This can be in the form of an epidural abscess, which can cause pressure inside of the spine, causing compression of the spinal cord with subsequent paralysis. This would require an emergency surgery to decompress, and there are no guarantees that the patient would recover from the paralysis. 4. Discitis-This is an infection of the intervertebral discs.  It occurs in about 1% of discography procedures.  It is difficult to treat and it may lead to surgery.        2. Pain: the needles have to  go through skin and soft tissues, will cause soreness.       3. Damage to internal structures:  The nerves to be lesioned may be near blood vessels or    other nerves which can be potentially damaged.       4. Bleeding: Bleeding is more common if the patient is taking blood thinners such as  aspirin, Coumadin, Ticiid, Plavix, etc., or if he/she have some genetic predisposition  such as hemophilia.  Bleeding into the spinal canal can cause compression of the spinal  cord with subsequent paralysis.  This would require an emergency surgery to  decompress and there are no guarantees that the patient would recover from the  paralysis.       5. Pneumothorax:  Puncturing of a lung is a possibility, every time a needle is introduced in  the area of the chest or upper back.  Pneumothorax refers to free air around the  collapsed lung(s), inside of the thoracic cavity (chest cavity).  Another two possible  complications related to a similar event would include: Hemothorax and Chylothorax.   These are variations of the Pneumothorax, where instead of air around the collapsed  lung(s), you may have blood or chyle, respectively.       6. Spinal headaches: They may occur with any procedures in the area of the spine.       7. Persistent CSF (Cerebro-Spinal Fluid) leakage: This is a rare problem, but may occur  with prolonged intrathecal or epidural catheters either due to the formation of a fistulous  track or a dural tear.       8. Nerve damage: By working so close to the spinal cord, there is always a possibility of  nerve damage, which could be as serious as a permanent spinal cord injury with  paralysis.       9. Death:  Although rare, severe deadly allergic reactions known as "Anaphylactic  reaction" can occur to any of the medications used.      10. Worsening of the symptoms:  We can always make thing worse.  What are the chances of something like this happening? Chances of any of this occuring are extremely low.  By statistics, you have more of a chance of getting killed in a motor vehicle accident: while driving to the hospital than any of the above occurring .  Nevertheless, you should be aware that they are possibilities.  In general, it is similar to taking a shower.  Everybody knows that you can slip, hit your head and get killed.  Does that mean that you should not shower again?  Nevertheless always keep  in mind that statistics do not mean anything if you happen to be on the wrong side of them.  Even if a procedure has a 1 (one) in a 1,000,000 (million) chance of going wrong, it you happen to be that one..Also, keep in mind that by statistics, you have more of a chance of having something go wrong when taking medications.  Who should not have this procedure? If you are on a blood thinning medication (e.g. Coumadin, Plavix, see list of "Blood Thinners"), or if you have an active infection going on, you should not have the procedure.  If you are taking any blood thinners, please inform your physician.  How should I prepare for this procedure?  Do not eat or drink anything at least six hours prior to the procedure.  Bring a driver with you .  It cannot  be a taxi.  Come accompanied by an adult that can drive you back, and that is strong enough to help you if your legs get weak or numb from the local anesthetic.  Take all of your medicines the morning of the procedure with just enough water to swallow them.  If you have diabetes, make sure that you are scheduled to have your procedure done first thing in the morning, whenever possible.  If you have diabetes, take only half of your insulin dose and notify our nurse that you have done so as soon as you arrive at the clinic.  If you are diabetic, but only take blood sugar pills (oral hypoglycemic), then do not take them on the morning of your procedure.  You may take them after you have had the procedure.  Do not take aspirin or any aspirin-containing medications, at least eleven (11) days prior to the procedure.  They may prolong bleeding.  Wear loose fitting clothing that may be easy to take off and that you would not mind if it got stained with Betadine or blood.  Do not wear any jewelry or perfume  Remove any nail coloring.  It will interfere with some of our monitoring equipment.  NOTE: Remember that this is not meant to be interpreted as a  complete list of all possible complications.  Unforeseen problems may occur.  BLOOD THINNERS The following drugs contain aspirin or other products, which can cause increased bleeding during surgery and should not be taken for 2 weeks prior to and 1 week after surgery.  If you should need take something for relief of minor pain, you may take acetaminophen which is found in Tylenol,m Datril, Anacin-3 and Panadol. It is not blood thinner. The products listed below are.  Do not take any of the products listed below in addition to any listed on your instruction sheet.  A.P.C or A.P.C with Codeine Codeine Phosphate Capsules #3 Ibuprofen Ridaura  ABC compound Congesprin Imuran rimadil  Advil Cope Indocin Robaxisal  Alka-Seltzer Effervescent Pain Reliever and Antacid Coricidin or Coricidin-D  Indomethacin Rufen  Alka-Seltzer plus Cold Medicine Cosprin Ketoprofen S-A-C Tablets  Anacin Analgesic Tablets or Capsules Coumadin Korlgesic Salflex  Anacin Extra Strength Analgesic tablets or capsules CP-2 Tablets Lanoril Salicylate  Anaprox Cuprimine Capsules Levenox Salocol  Anexsia-D Dalteparin Magan Salsalate  Anodynos Darvon compound Magnesium Salicylate Sine-off  Ansaid Dasin Capsules Magsal Sodium Salicylate  Anturane Depen Capsules Marnal Soma  APF Arthritis pain formula Dewitt's Pills Measurin Stanback  Argesic Dia-Gesic Meclofenamic Sulfinpyrazone  Arthritis Bayer Timed Release Aspirin Diclofenac Meclomen Sulindac  Arthritis pain formula Anacin Dicumarol Medipren Supac  Analgesic (Safety coated) Arthralgen Diffunasal Mefanamic Suprofen  Arthritis Strength Bufferin Dihydrocodeine Mepro Compound Suprol  Arthropan liquid Dopirydamole Methcarbomol with Aspirin Synalgos  ASA tablets/Enseals Disalcid Micrainin Tagament  Ascriptin Doan's Midol Talwin  Ascriptin A/D Dolene Mobidin Tanderil  Ascriptin Extra Strength Dolobid Moblgesic Ticlid  Ascriptin with Codeine Doloprin or Doloprin with Codeine  Momentum Tolectin  Asperbuf Duoprin Mono-gesic Trendar  Aspergum Duradyne Motrin or Motrin IB Triminicin  Aspirin plain, buffered or enteric coated Durasal Myochrisine Trigesic  Aspirin Suppositories Easprin Nalfon Trillsate  Aspirin with Codeine Ecotrin Regular or Extra Strength Naprosyn Uracel  Atromid-S Efficin Naproxen Ursinus  Auranofin Capsules Elmiron Neocylate Vanquish  Axotal Emagrin Norgesic Verin  Azathioprine Empirin or Empirin with Codeine Normiflo Vitamin E  Azolid Emprazil Nuprin Voltaren  Bayer Aspirin plain, buffered or children's or timed BC Tablets or powders Encaprin Orgaran Warfarin Sodium  Buff-a-Comp  Enoxaparin Orudis Zorpin  Buff-a-Comp with Codeine Equegesic Os-Cal-Gesic   Buffaprin Excedrin plain, buffered or Extra Strength Oxalid   Bufferin Arthritis Strength Feldene Oxphenbutazone   Bufferin plain or Extra Strength Feldene Capsules Oxycodone with Aspirin   Bufferin with Codeine Fenoprofen Fenoprofen Pabalate or Pabalate-SF   Buffets II Flogesic Panagesic   Buffinol plain or Extra Strength Florinal or Florinal with Codeine Panwarfarin   Buf-Tabs Flurbiprofen Penicillamine   Butalbital Compound Four-way cold tablets Penicillin   Butazolidin Fragmin Pepto-Bismol   Carbenicillin Geminisyn Percodan   Carna Arthritis Reliever Geopen Persantine   Carprofen Gold's salt Persistin   Chloramphenicol Goody's Phenylbutazone   Chloromycetin Haltrain Piroxlcam   Clmetidine heparin Plaquenil   Cllnoril Hyco-pap Ponstel   Clofibrate Hydroxy chloroquine Propoxyphen         Before stopping any of these medications, be sure to consult the physician who ordered them.  Some, such as Coumadin (Warfarin) are ordered to prevent or treat serious conditions such as "deep thrombosis", "pumonary embolisms", and other heart problems.  The amount of time that you may need off of the medication may also vary with the medication and the reason for which you were taking it.  If you are  taking any of these medications, please make sure you notify your pain physician before you undergo any procedures.

## 2015-01-04 ENCOUNTER — Other Ambulatory Visit: Payer: Self-pay | Admitting: Pain Medicine

## 2015-01-04 ENCOUNTER — Telehealth: Payer: Self-pay | Admitting: *Deleted

## 2015-01-04 NOTE — Telephone Encounter (Signed)
Denies complications post procedure. 

## 2015-02-03 ENCOUNTER — Ambulatory Visit: Payer: BLUE CROSS/BLUE SHIELD | Attending: Pain Medicine | Admitting: Pain Medicine

## 2015-02-03 ENCOUNTER — Encounter: Payer: Self-pay | Admitting: Pain Medicine

## 2015-02-03 VITALS — BP 123/74 | HR 108 | Temp 98.0°F | Resp 16 | Ht 62.0 in | Wt 123.0 lb

## 2015-02-03 DIAGNOSIS — M79605 Pain in left leg: Secondary | ICD-10-CM | POA: Diagnosis present

## 2015-02-03 DIAGNOSIS — M5416 Radiculopathy, lumbar region: Secondary | ICD-10-CM | POA: Diagnosis not present

## 2015-02-03 DIAGNOSIS — M79604 Pain in right leg: Secondary | ICD-10-CM | POA: Diagnosis present

## 2015-02-03 DIAGNOSIS — IMO0002 Reserved for concepts with insufficient information to code with codable children: Secondary | ICD-10-CM | POA: Insufficient documentation

## 2015-02-03 DIAGNOSIS — M797 Fibromyalgia: Secondary | ICD-10-CM | POA: Insufficient documentation

## 2015-02-03 DIAGNOSIS — M5136 Other intervertebral disc degeneration, lumbar region: Secondary | ICD-10-CM | POA: Diagnosis not present

## 2015-02-03 DIAGNOSIS — M419 Scoliosis, unspecified: Secondary | ICD-10-CM | POA: Insufficient documentation

## 2015-02-03 DIAGNOSIS — M5126 Other intervertebral disc displacement, lumbar region: Secondary | ICD-10-CM | POA: Insufficient documentation

## 2015-02-03 DIAGNOSIS — M47816 Spondylosis without myelopathy or radiculopathy, lumbar region: Secondary | ICD-10-CM

## 2015-02-03 DIAGNOSIS — M533 Sacrococcygeal disorders, not elsewhere classified: Secondary | ICD-10-CM | POA: Diagnosis not present

## 2015-02-03 DIAGNOSIS — G629 Polyneuropathy, unspecified: Secondary | ICD-10-CM

## 2015-02-03 DIAGNOSIS — M545 Low back pain: Secondary | ICD-10-CM | POA: Diagnosis present

## 2015-02-03 MED ORDER — CIPROFLOXACIN HCL 500 MG PO TABS: ORAL_TABLET | ORAL | Status: DC

## 2015-02-03 NOTE — Progress Notes (Signed)
Subjective:    Patient ID: Monique Castro, female    DOB: December 01, 1970, 45 y.o.   MRN: [id number]  HPI Patient is 44 year old female returns to pain management for further evaluation and treatment of pain involving the lower back lower extremity region. Patient states she had greater than 50% relief of pain following lumbar facet, medial branch nerve, blocks. Patient stated that she continues to have significant benefit from the lumbar facet, medial branch nerve, blocks. Patient is in hopes of being able to undergo radiofrequency rhizolysis lumbar facet, medial branch nerves radiofrequency rhizolysis. Patient also with complaint Of pain involving the elbow. There is concern regarding component epicondylitis contributing to patient's pain of the elbow. We recommended patient with strap as discussed and avoid excessive use of the extremity. Patient was given prescription for Cipro AND WILL UNDERGO FURTHER EVALUATION INCLUDING MRI and other studies pending response to Cipro and follow-up evaluation. Patient also instructed follow-up for primary care physician Dr.Thies further evaluation of the pain of the right elbow as well as evaluation of her general medical condition. At the present time we will plan to proceed with radiofrequency rhizolysis lumbar facet, medial branch nerve radiofrequency rhizolysis at time return appointment. The patient was in agreement with suggested treatment plan.       Review of Systems     Objective:   Physical Exam  There was tenderness of the splenius capitis and occipitalis musculature region. Palpation of which reproduced pain of mild degree. There was mild tinnitus of the acromioclavicular and glenohumeral joint region. Patient appeared to be with slightly decreased grip strength on the right compared to the left. There appeared to be unremarkable Spurling's maneuver. Palpation over the thoracic facet cervical facet and thoracic and cervical paraspinal musculature  region reproduced mild discomfort. Palpation of the medial and lateral epicondyles of the elbow were with tinged palpation of the right upper extremity. No increased warmth or erythema of the elbow was noted. Please note the patient stated that there had been increased warmth and erythema of the elbow. There was tenderness to palpation over the lumbar paraspinal musculature region lumbar facet region of mild to moderate degree. Lateral bending and rotation extension to palpation lumbar facets reproduce mild to moderate discomfort. Straight leg raising was tolerates approximately 30 without increase of pain with dorsiflexion noted. There was tenderness over the PSIS and PII S region. DTRs were difficult to elicit patient had difficulty relaxing. No sensory deficit of dermatomal dispositions detected. There was negative clonus negative Homans. Abdomen was nontender with no costovertebral angle tenderness noted.      Assessment & Plan:    Degenerative disc disease lumbar spine Small focal extraforaminal disc protrusion on the left at L4-5 potentially irritating the left L4 nerve root. Focal central disc protrusion at L5-S1 possibly irritating both S1 nerve roots  Lumbar radiculopathy  Lumbar facet syndrome  Sacroiliac joint dysfunction  Scoliosis  Fibromyalgia     PLAN   Continue present medication . Patient was given prescription for Cipro on today's visit. Patient stated severity been increased warmth and erythema in the region of the right elbow.patient stated that the pain of the elbow is aggravated by performing activities on the job  Radiofrequency rhizolysis lumbar facet, medial branch nerves, to be performed at time return appointment  F/U PCP  Dr.Thies for evaliation of pain of the elbow as well as for evaluation of BP and general medical  condition  F/U surgical evaluation. May consider pending follow-up evaluations  F/U neurological  evaluation. May consider pending  follow-up evaluations   MRI of the elbow. We discussed obtaining MRI of the elbow as well as of the studies and will consider further studies as discussed pending follow-up evaluation  May consider radiofrequency rhizolysis or intraspinal procedures pending response to present treatment and F/U evaluation   Patient to call [facility] Center should patient have concerns prior to scheduled return appointment.

## 2015-02-03 NOTE — Progress Notes (Signed)
Safety precautions to be maintained throughout the outpatient stay will include: orient to surroundings, keep bed in low position, maintain call bell within reach at all times, provide assistance with transfer out of bed and ambulation.  

## 2015-02-03 NOTE — Patient Instructions (Addendum)
PLAN   Continue present medication and begin Cipro as discussed  Radiofrequency rhizolysis lumbar facet, medial branch nerves, to be performed at time of return appointment . Ask Morrie Sheldon if you have been approved for radiofrequency procedure lumbar facet, medial branch nerves on Wednesday, 03/02/2015  F/U PCP Dr.Thies  for evaliation pain of elbow and for evaluation of  BP and general medical  condition  F/U surgical evaluation. May consider pending follow-up evaluations  F/U neurological evaluation. May consider pending follow-up evaluations  May consider radiofrequency rhizolysis or intraspinal procedures pending response to present treatment and F/U evaluation   Patient to call [facility] should patient have concerns prior to scheduled return appointment. Radiofrequency Lesioning Radiofrequency lesioning is a procedure to relieve pain. The procedure is often used for back, neck, or arm pain. Radiofrequency lesioning uses a specialized machine that creates radio waves to make heat. The heat damages the nerve that carries the pain signal. Pain relief usually lasts 6 months to 1 year.  LET YOUR CAREGIVER KNOW ABOUT:  Allergies to food or medicine.  Medicines taken, including vitamins, herbs, eyedrops, over-the-counter medicines, and creams.  Use of steroids (by mouth or creams).  Previous problems with anesthetics or numbing medicines.  History of bleeding problems or blood clots.  Previous surgery.  Other health problems, including diabetes and kidney problems.  Possibility of pregnancy, if this applies.  Breathing problems and smoking history.  Any recent colds or infections. RISKS AND COMPLICATIONS This procedure is generally safe. The risks and complications depend on what treatment site is used. General complications may include:  Pain or soreness at the injection site.  Infection at the injection site.  Damage to nerves or blood vessels. BEFORE THE  PROCEDURE  Ask your caregiver about changing or stopping any medicines you are on before the procedure.  If you take blood thinners, ask if you should stop taking them before the procedure.  You may be asked to wash with an antibiotic soap before the procedure.  Do not eat or drink for 8 hours before your procedure or as told by your caregiver.  Ask your caregiver what time you need to arrive for your procedure.  This is an outpatient procedure. This means you will be able to go home the same day. Make plans for someone to drive you home. PROCEDURE  You will be awake during the procedure. You need to be able to talk to the surgeon during the procedure. However, you might be given medicine to help you relax (sedative).  Medicine to numb the area (local anesthetic) will be injected.  With the help of a type of X-ray (fluoroscopy), a radio frequency needle will be inserted into the area to be treated. Then, a wire that carries the radio waves (electrode) will be put through the radio frequency needle. An electrical pulse will be sent through the electrode to verify the correct nerve.  You will feel a tingling sensation similar to hitting your "funny bone." You may also have muscle twitching. The tissue around the needle tip is then heated when electric current is passed using the radio frequency machine. This numbs the nerves.  A bandage (dressing) will be put on the area after the procedure is done. AFTER THE PROCEDURE  You will stay in a recovery area until you are awake enough to eat and drink.  Once everything is back to normal, you will be able to go home.  You will need to arrange for someone to drive you  home if you received a sedative or pain relieving medicine during the procedure. Document Released: 12/27/2010 Document Revised: 07/23/2011 Document Reviewed: 12/27/2010 [facility] Patient Information 2015 [place], [place]. This information is not intended to replace advice given to  you by your health care provider. Make sure you discuss any questions you have with your health care provider.

## 2015-06-10 ENCOUNTER — Other Ambulatory Visit: Payer: Self-pay | Admitting: Pain Medicine

## 2015-06-13 ENCOUNTER — Ambulatory Visit: Payer: BLUE CROSS/BLUE SHIELD | Attending: Pain Medicine | Admitting: Pain Medicine

## 2015-06-13 ENCOUNTER — Encounter: Payer: Self-pay | Admitting: Pain Medicine

## 2015-06-13 VITALS — BP 143/86 | HR 102 | Temp 98.3°F | Resp 16 | Ht 62.0 in | Wt 130.0 lb

## 2015-06-13 DIAGNOSIS — M533 Sacrococcygeal disorders, not elsewhere classified: Secondary | ICD-10-CM

## 2015-06-13 DIAGNOSIS — M5116 Intervertebral disc disorders with radiculopathy, lumbar region: Secondary | ICD-10-CM | POA: Insufficient documentation

## 2015-06-13 DIAGNOSIS — M545 Low back pain: Secondary | ICD-10-CM | POA: Diagnosis present

## 2015-06-13 DIAGNOSIS — M5126 Other intervertebral disc displacement, lumbar region: Secondary | ICD-10-CM | POA: Diagnosis not present

## 2015-06-13 DIAGNOSIS — M79604 Pain in right leg: Secondary | ICD-10-CM | POA: Diagnosis present

## 2015-06-13 DIAGNOSIS — M5416 Radiculopathy, lumbar region: Secondary | ICD-10-CM

## 2015-06-13 DIAGNOSIS — M79605 Pain in left leg: Secondary | ICD-10-CM | POA: Diagnosis present

## 2015-06-13 DIAGNOSIS — M419 Scoliosis, unspecified: Secondary | ICD-10-CM | POA: Diagnosis not present

## 2015-06-13 DIAGNOSIS — IMO0002 Reserved for concepts with insufficient information to code with codable children: Secondary | ICD-10-CM

## 2015-06-13 DIAGNOSIS — M5136 Other intervertebral disc degeneration, lumbar region: Secondary | ICD-10-CM

## 2015-06-13 DIAGNOSIS — M47816 Spondylosis without myelopathy or radiculopathy, lumbar region: Secondary | ICD-10-CM

## 2015-06-13 DIAGNOSIS — M797 Fibromyalgia: Secondary | ICD-10-CM | POA: Diagnosis not present

## 2015-06-13 DIAGNOSIS — G629 Polyneuropathy, unspecified: Secondary | ICD-10-CM

## 2015-06-13 NOTE — Progress Notes (Signed)
Subjective:    Patient ID: Monique Castro, female    DOB: [birth year], 45 y.o.   MRN: [id number]  HPI  The patient is a 45 year old female who returns to pain management for further evaluation and treatment of pain involving the mid lower back and lower extremity regions. The patient is with significant pain of the lumbar lower extremity region with known degenerative changes of the lumbar spine as well as concern regarding significant component of patient's pain being due to sacroiliac joint dysfunction. We discussed patient's condition and patient states that her sacroiliac joint dysfunction is being treated by [employer]. The patient stated that she was in need treatment of pain of her lumbar region. We discussed patient's condition and further detailed up evaluation of patient and inform patient that we could proceed with lumbar facet, medial branch nerve, blocks at time return appointment in attempt to decrease severity of lumbar pain as well as retard progression of symptoms, and avoid need for more involved treatment. The patient stated that this was permissible with for Workmen's Compensation and insurance carriersThe patient that this could be a conflict. After rather repeated discussion of this issue the patient reassured [place] that she was aware that this was no conflict and that she was willing to proceed with the procedure and that her Workmen's Compensation was treating her sacroiliac joint condition and she wishes to proceed with her private insurance for treatment of her lumbar region. All agreed with suggested treatment plan        Review of Systems     Objective:   Physical Exam  There was tends to palpation of paraspinal muscular region the cervical region cervical facet region a mild degree with mild tenderness of the splenius capitis and occipitalis musculature regions. There appeared to be unremarkable Spurling's maneuver. Palpation over the thoracic facet thoracic  paraspinal musculature region was attends to palpation of mild to moderate degree with mild to moderate muscle spasms involving the lower thoracic region. No crepitus of the thoracic region was noted. Patient of the splenius capitis and occipitalis musculature regions reproduce mild discomfort. There was mild tenderness to palpation of the medial and lateral epicondyles of the elbows. Tinel and Phalen's maneuver were without increase of pain of significant degree. There was tenderness over the lumbar paraspinal must reason lumbar facet region of moderate degree with moderate to moderately severe increased pain with extension and palpation of the lumbar facets. There was tends to palpation of the PSIS and PII S region a moderate degree. There was moderate tenderness to palpation of the gluteal and piriformis musculature regions. Straight leg raise was tolerates approximately 30 without a definite increased pain with dorsiflexion noted. No definite sensory deficit or dermatomal distribution detected. There was negative clonus negative Homans. Abdomen nontender with no costovertebral tenderness noted.      Assessment & Plan:    Degenerative disc disease lumbar spine Small focal extraforaminal disc protrusion on the left at L4-5 potentially irritating the left L4 nerve root. Focal central disc protrusion at L5-S1 possibly irritating both S1 nerve roots  Lumbar radiculopathy  Lumbar facet syndrome  Sacroiliac joint dysfunction  Scoliosis  Fibromyalgia           PLAN   Continue present medication  Lumbar facet, medial branch nerve blocks, to be performed at time return appointment  F/U PCP Dr.Thies  for evaliation  of  BP and general medical  condition  F/U surgical evaluation. May consider pending follow-up evaluations  F/U neurological  evaluation. May consider pending follow-up evaluations  May consider radiofrequency rhizolysis or intraspinal procedures pending response to present  treatment and F/U evaluation   Patient to call [facility] should patient have concerns prior to scheduled return appointment

## 2015-06-13 NOTE — Patient Instructions (Addendum)
PLAN   Continue present medication  Lumbar facet, medial branch nerve blocks, to be performed at time return appointment  F/U PCP Dr.Thies  for evaliation  of  BP and general medical  condition  F/U surgical evaluation. May consider pending follow-up evaluations  F/U neurological evaluation. May consider pending follow-up evaluations  May consider radiofrequency rhizolysis or intraspinal procedures pending response to present treatment and F/U evaluation   Patient to call [facility] should patient have concerns prior to scheduled return appointment.GENERAL RISKS AND COMPLICATIONS  What are the risk, side effects and possible complications? Generally speaking, most procedures are safe.  However, with any procedure there are risks, side effects, and the possibility of complications.  The risks and complications are dependent upon the sites that are lesioned, or the type of nerve block to be performed.  The closer the procedure is to the spine, the more serious the risks are.  Great care is taken when placing the radio frequency needles, block needles or lesioning probes, but sometimes complications can occur. 1. Infection: Any time there is an injection through the skin, there is a risk of infection.  This is why sterile conditions are used for these blocks.  There are four possible types of infection. 1. Localized skin infection. 2. Central Nervous System Infection-This can be in the form of Meningitis, which can be deadly. 3. Epidural Infections-This can be in the form of an epidural abscess, which can cause pressure inside of the spine, causing compression of the spinal cord with subsequent paralysis. This would require an emergency surgery to decompress, and there are no guarantees that the patient would recover from the paralysis. 4. Discitis-This is an infection of the intervertebral discs.  It occurs in about 1% of discography procedures.  It is difficult to treat and it may  lead to surgery.        2. Pain: the needles have to go through skin and soft tissues, will cause soreness.       3. Damage to internal structures:  The nerves to be lesioned may be near blood vessels or    other nerves which can be potentially damaged.       4. Bleeding: Bleeding is more common if the patient is taking blood thinners such as  aspirin, Coumadin, Ticiid, Plavix, etc., or if he/she have some genetic predisposition  such as hemophilia. Bleeding into the spinal canal can cause compression of the spinal  cord with subsequent paralysis.  This would require an emergency surgery to  decompress and there are no guarantees that the patient would recover from the  paralysis.       5. Pneumothorax:  Puncturing of a lung is a possibility, every time a needle is introduced in  the area of the chest or upper back.  Pneumothorax refers to free air around the  collapsed lung(s), inside of the thoracic cavity (chest cavity).  Another two possible  complications related to a similar event would include: Hemothorax and Chylothorax.   These are variations of the Pneumothorax, where instead of air around the collapsed  lung(s), you may have blood or chyle, respectively.       6. Spinal headaches: They may occur with any procedures in the area of the spine.       7. Persistent CSF (Cerebro-Spinal Fluid) leakage: This is a rare problem, but may occur  with prolonged intrathecal or epidural catheters either due to the formation of a fistulous  track or a dural  tear.       8. Nerve damage: By working so close to the spinal cord, there is always a possibility of  nerve damage, which could be as serious as a permanent spinal cord injury with  paralysis.       9. Death:  Although rare, severe deadly allergic reactions known as "Anaphylactic  reaction" can occur to any of the medications used.      10. Worsening of the symptoms:  We can always make thing worse.  What are the chances of something like this  happening? Chances of any of this occuring are extremely low.  By statistics, you have more of a chance of getting killed in a motor vehicle accident: while driving to the hospital than any of the above occurring .  Nevertheless, you should be aware that they are possibilities.  In general, it is similar to taking a shower.  Everybody knows that you can slip, hit your head and get killed.  Does that mean that you should not shower again?  Nevertheless always keep in mind that statistics do not mean anything if you happen to be on the wrong side of them.  Even if a procedure has a 1 (one) in a 1,000,000 (million) chance of going wrong, it you happen to be that one..Also, keep in mind that by statistics, you have more of a chance of having something go wrong when taking medications.  Who should not have this procedure? If you are on a blood thinning medication (e.g. Coumadin, Plavix, see list of "Blood Thinners"), or if you have an active infection going on, you should not have the procedure.  If you are taking any blood thinners, please inform your physician.  How should I prepare for this procedure?  Do not eat or drink anything at least six hours prior to the procedure.  Bring a driver with you .  It cannot be a taxi.  Come accompanied by an adult that can drive you back, and that is strong enough to help you if your legs get weak or numb from the local anesthetic.  Take all of your medicines the morning of the procedure with just enough water to swallow them.  If you have diabetes, make sure that you are scheduled to have your procedure done first thing in the morning, whenever possible.  If you have diabetes, take only half of your insulin dose and notify our nurse that you have done so as soon as you arrive at the clinic.  If you are diabetic, but only take blood sugar pills (oral hypoglycemic), then do not take them on the morning of your procedure.  You may take them after you have had the  procedure.  Do not take aspirin or any aspirin-containing medications, at least eleven (11) days prior to the procedure.  They may prolong bleeding.  Wear loose fitting clothing that may be easy to take off and that you would not mind if it got stained with Betadine or blood.  Do not wear any jewelry or perfume  Remove any nail coloring.  It will interfere with some of our monitoring equipment.  NOTE: Remember that this is not meant to be interpreted as a complete list of all possible complications.  Unforeseen problems may occur.  BLOOD THINNERS The following drugs contain aspirin or other products, which can cause increased bleeding during surgery and should not be taken for 2 weeks prior to and 1 week after surgery.  If you should  need take something for relief of minor pain, you may take acetaminophen which is found in Tylenol,m Datril, Anacin-3 and Panadol. It is not blood thinner. The products listed below are.  Do not take any of the products listed below in addition to any listed on your instruction sheet.  A.P.C or A.P.C with Codeine Codeine Phosphate Capsules #3 Ibuprofen Ridaura  ABC compound Congesprin Imuran rimadil  Advil Cope Indocin Robaxisal  Alka-Seltzer Effervescent Pain Reliever and Antacid Coricidin or Coricidin-D  Indomethacin Rufen  Alka-Seltzer plus Cold Medicine Cosprin Ketoprofen S-A-C Tablets  Anacin Analgesic Tablets or Capsules Coumadin Korlgesic Salflex  Anacin Extra Strength Analgesic tablets or capsules CP-2 Tablets Lanoril Salicylate  Anaprox Cuprimine Capsules Levenox Salocol  Anexsia-D Dalteparin Magan Salsalate  Anodynos Darvon compound Magnesium Salicylate Sine-off  Ansaid Dasin Capsules Magsal Sodium Salicylate  Anturane Depen Capsules Marnal Soma  APF Arthritis pain formula Dewitt's Pills Measurin Stanback  Argesic Dia-Gesic Meclofenamic Sulfinpyrazone  Arthritis Bayer Timed Release Aspirin Diclofenac Meclomen Sulindac  Arthritis pain formula  Anacin Dicumarol Medipren Supac  Analgesic (Safety coated) Arthralgen Diffunasal Mefanamic Suprofen  Arthritis Strength Bufferin Dihydrocodeine Mepro Compound Suprol  Arthropan liquid Dopirydamole Methcarbomol with Aspirin Synalgos  ASA tablets/Enseals Disalcid Micrainin Tagament  Ascriptin Doan's Midol Talwin  Ascriptin A/D Dolene Mobidin Tanderil  Ascriptin Extra Strength Dolobid Moblgesic Ticlid  Ascriptin with Codeine Doloprin or Doloprin with Codeine Momentum Tolectin  Asperbuf Duoprin Mono-gesic Trendar  Aspergum Duradyne Motrin or Motrin IB Triminicin  Aspirin plain, buffered or enteric coated Durasal Myochrisine Trigesic  Aspirin Suppositories Easprin Nalfon Trillsate  Aspirin with Codeine Ecotrin Regular or Extra Strength Naprosyn Uracel  Atromid-S Efficin Naproxen Ursinus  Auranofin Capsules Elmiron Neocylate Vanquish  Axotal Emagrin Norgesic Verin  Azathioprine Empirin or Empirin with Codeine Normiflo Vitamin E  Azolid Emprazil Nuprin Voltaren  Bayer Aspirin plain, buffered or children's or timed BC Tablets or powders Encaprin Orgaran Warfarin Sodium  Buff-a-Comp Enoxaparin Orudis Zorpin  Buff-a-Comp with Codeine Equegesic Os-Cal-Gesic   Buffaprin Excedrin plain, buffered or Extra Strength Oxalid   Bufferin Arthritis Strength Feldene Oxphenbutazone   Bufferin plain or Extra Strength Feldene Capsules Oxycodone with Aspirin   Bufferin with Codeine Fenoprofen Fenoprofen Pabalate or Pabalate-SF   Buffets II Flogesic Panagesic   Buffinol plain or Extra Strength Florinal or Florinal with Codeine Panwarfarin   Buf-Tabs Flurbiprofen Penicillamine   Butalbital Compound Four-way cold tablets Penicillin   Butazolidin Fragmin Pepto-Bismol   Carbenicillin Geminisyn Percodan   Carna Arthritis Reliever Geopen Persantine   Carprofen Gold's salt Persistin   Chloramphenicol Goody's Phenylbutazone   Chloromycetin Haltrain Piroxlcam   Clmetidine heparin Plaquenil   Cllnoril Hyco-pap  Ponstel   Clofibrate Hydroxy chloroquine Propoxyphen         Before stopping any of these medications, be sure to consult the physician who ordered them.  Some, such as Coumadin (Warfarin) are ordered to prevent or treat serious conditions such as "deep thrombosis", "pumonary embolisms", and other heart problems.  The amount of time that you may need off of the medication may also vary with the medication and the reason for which you were taking it.  If you are taking any of these medications, please make sure you notify your pain physician before you undergo any procedures.         Pain Management Discharge Instructions  General Discharge Instructions :  If you need to reach your doctor call: Monday-Friday 8:00 am - 4:00 pm at [phone] or toll free [phone].  After clinic hours [phone] to have  operator reach doctor.  Bring all of your medication bottles to all your appointments in the pain clinic.  To cancel or reschedule your appointment with Pain Management please remember to call 24 hours in advance to avoid a fee.  Refer to the educational materials which you have been given on: General Risks, I had my Procedure. Discharge Instructions, Post Sedation.  Post Procedure Instructions:  The drugs you were given will stay in your system until tomorrow, so for the next 24 hours you should not drive, make any legal decisions or drink any alcoholic beverages.  You may eat anything you prefer, but it is better to start with liquids then soups and crackers, and gradually work up to solid foods.  Please notify your doctor immediately if you have any unusual bleeding, trouble breathing or pain that is not related to your normal pain.  Depending on the type of procedure that was done, some parts of your body may feel week and/or numb.  This usually clears up by tonight or the next day.  Walk with the use of an assistive device or accompanied by an adult for the 24  hours.  You may use ice on the affected area for the first 24 hours.  Put ice in a Ziploc bag and cover with a towel and place against area 15 minutes on 15 minutes off.  You may switch to heat after 24 hours.Facet Joint Block The facet joints connect the bones of the spine (vertebrae). They make it possible for you to bend, twist, and make other movements with your spine. They also prevent you from overbending, overtwisting, and making other excessive movements.  A facet joint block is a procedure where a numbing medicine (anesthetic) is injected into a facet joint. Often, a type of anti-inflammatory medicine called a steroid is also injected. A facet joint block may be done for two reasons:  2. Diagnosis. A facet joint block may be done as a test to see whether neck or back pain is caused by a worn-down or infected facet joint. If the pain gets better after a facet joint block, it means the pain is probably coming from the facet joint. If the pain does not get better, it means the pain is probably not coming from the facet joint.  3. Therapy. A facet joint block may be done to relieve neck or back pain caused by a facet joint. A facet joint block is only done as a therapy if the pain does not improve with medicine, exercise programs, physical therapy, and other forms of pain management. LET [facility] CARE PROVIDER KNOW ABOUT:   Any allergies you have.   All medicines you are taking, including vitamins, herbs, eyedrops, and over-the-counter medicines and creams.   Previous problems you or members of your family have had with the use of anesthetics.   Any blood disorders you have had.   Other health problems you have. RISKS AND COMPLICATIONS Generally, having a facet joint block is safe. However, as with any procedure, complications can occur. Possible complications associated with having a facet joint block include:   Bleeding.   Injury to a nerve near the injection site.   Pain at  the injection site.   Weakness or numbness in areas controlled by nerves near the injection site.   Infection.   Temporary fluid retention.   Allergic reaction to anesthetics or medicines used during the procedure. BEFORE THE PROCEDURE   Follow your health care provider's instructions if you  are taking dietary supplements or medicines. You may need to stop taking them or reduce your dosage.   Do not take any new dietary supplements or medicines without asking your health care provider first.   Follow your health care provider's instructions about eating and drinking before the procedure. You may need to stop eating and drinking several hours before the procedure.   Arrange to have an adult drive you home after the procedure. PROCEDURE 12. You may need to remove your clothing and dress in an open-back gown so that your health care provider can access your spine.  13. The procedure will be done while you are lying on an X-ray table. Most of the time you will be asked to lie on your stomach, but you may be asked to lie in a different position if an injection will be made in your neck.  14. Special machines will be used to monitor your oxygen levels, heart rate, and blood pressure.  15. If an injection will be made in your neck, an intravenous (IV) tube will be inserted into one of your veins. Fluids and medicine will flow directly into your body through the IV tube.  16. The area over the facet joint where the injection will be made will be cleaned with an antiseptic soap. The surrounding skin will be covered with sterile drapes.  17. An anesthetic will be applied to your skin to make the injection area numb. You may feel a temporary stinging or burning sensation.  18. A video X-ray machine will be used to locate the joint. A contrast dye may be injected into the facet joint area to help with locating the joint.  19. When the joint is located, an anesthetic medicine will be  injected into the joint through the needle.  20. Your health care provider will ask you whether you feel pain relief. If you do feel relief, a steroid may be injected to provide pain relief for a longer period of time. If you do not feel relief or feel only partial relief, additional injections of an anesthetic may be made in other facet joints.  21. The needle will be removed, the skin will be cleansed, and bandages will be applied.  AFTER THE PROCEDURE   You will be observed for 15-30 minutes before being allowed to go home. Do not drive. Have an adult drive you or take a taxi or public transportation instead.   If you feel pain relief, the pain will return in several hours or days when the anesthetic wears off.   You may feel pain relief 2-14 days after the procedure. The amount of time this relief lasts varies from person to person.   It is normal to feel some tenderness over the injected area(s) for 2 days following the procedure.   If you have diabetes, you may have a temporary increase in blood sugar.   This information is not intended to replace advice given to you by your health care provider. Make sure you discuss any questions you have with your health care provider.   Document Released: 09/19/2006 Document Revised: 05/21/2014 Document Reviewed: 02/18/2012 Elsevier Interactive Patient Education 2016 [employer] Facet Joint Block, Care After Refer to this sheet in the next few weeks. These instructions provide you with information on caring for yourself after your procedure. Your health care provider may also give you more specific instructions. Your treatment has been planned according to current medical practices, but problems sometimes occur. Call your health care  provider if you have any problems or questions after your procedure. HOME CARE INSTRUCTIONS  4. Keep track of the amount of pain relief you feel and how long it lasts. 5. Limit pain medicine within the first  4-6 hours after the procedure as directed by your health care provider. 6. Resume taking dietary supplements and medicines as directed by your health care provider. 7. You may resume your regular diet. 8. Do not apply heat near or over the injection site(s) for 24 hours.  9. Do not take a bath or soak in water (such as a pool or lake) for 24 hours. 10. Do not drive for 24 hours unless approved by your health care provider. 11. Avoid strenuous activity for 24 hours. 12. Remove your bandages the morning after the procedure.  13. If the injection site is tender, applying an ice pack may relieve some tenderness. To do this: 1. Put ice in a bag. 2. Place a towel between your skin and the bag. 3. Leave the ice on for 15-20 minutes, 3-4 times a day. 14. Keep follow-up appointments as directed by your health care provider. SEEK MEDICAL CARE IF:   Your pain is not controlled by your medicines.   There is drainage from the injection site.   There is significant bleeding or swelling at the injection site.  You have diabetes and your blood sugar is above 180 mg/dL. SEEK IMMEDIATE MEDICAL CARE IF:   You develop a fever of 101F (38.3C) or greater.   You have worsening pain or swelling around the injection site.   You have red streaking around the injection site.   You develop severe pain that is not controlled by your medicines.   You develop a headache, stiff neck, nausea, or vomiting.   Your eyes become very sensitive to light.   You have weakness, paralysis, or tingling in your arms or legs that was not present before the procedure.   You develop difficulty urinating or breathing.    This information is not intended to replace advice given to you by your health care provider. Make sure you discuss any questions you have with your health care provider.   Document Released: 04/16/2012 Document Revised: 05/21/2014 Document Reviewed: 04/16/2012 Elsevier Interactive Patient  Education [employer]

## 2015-06-13 NOTE — Progress Notes (Signed)
Safety precautions to be maintained throughout the outpatient stay will include: orient to surroundings, keep bed in low position, maintain call bell within reach at all times, provide assistance with transfer out of bed and ambulation.  

## 2015-06-15 ENCOUNTER — Telehealth: Payer: Self-pay | Admitting: *Deleted

## 2015-06-15 NOTE — Telephone Encounter (Signed)
Patient called in reference to the fax sent to Dr Harrington Challenger; Patient states that one doctor retired from the clinic and she was to see Dr Harrington Challenger.  Patient also said she has gone to the pain clinic in [place]  And has seen Dr. Bing Ree but wanted to Change because it was out of network.Fax sent to Dr Bing Ree on [phone] for permission to prescribe pain medicine. Dr. Bing Ree [phone]

## 2015-06-20 ENCOUNTER — Ambulatory Visit: Payer: BLUE CROSS/BLUE SHIELD | Attending: Pain Medicine | Admitting: Pain Medicine

## 2015-06-20 ENCOUNTER — Encounter: Payer: Self-pay | Admitting: Pain Medicine

## 2015-06-20 VITALS — BP 139/83 | HR 98 | Temp 98.2°F | Resp 16 | Ht 62.0 in | Wt 130.0 lb

## 2015-06-20 DIAGNOSIS — M5416 Radiculopathy, lumbar region: Secondary | ICD-10-CM

## 2015-06-20 DIAGNOSIS — M5126 Other intervertebral disc displacement, lumbar region: Secondary | ICD-10-CM | POA: Insufficient documentation

## 2015-06-20 DIAGNOSIS — M47816 Spondylosis without myelopathy or radiculopathy, lumbar region: Secondary | ICD-10-CM

## 2015-06-20 DIAGNOSIS — G629 Polyneuropathy, unspecified: Secondary | ICD-10-CM

## 2015-06-20 DIAGNOSIS — M545 Low back pain: Secondary | ICD-10-CM | POA: Insufficient documentation

## 2015-06-20 DIAGNOSIS — IMO0002 Reserved for concepts with insufficient information to code with codable children: Secondary | ICD-10-CM

## 2015-06-20 DIAGNOSIS — M79606 Pain in leg, unspecified: Secondary | ICD-10-CM | POA: Diagnosis present

## 2015-06-20 DIAGNOSIS — M51369 Other intervertebral disc degeneration, lumbar region without mention of lumbar back pain or lower extremity pain: Secondary | ICD-10-CM

## 2015-06-20 DIAGNOSIS — M5136 Other intervertebral disc degeneration, lumbar region: Secondary | ICD-10-CM | POA: Diagnosis not present

## 2015-06-20 DIAGNOSIS — M533 Sacrococcygeal disorders, not elsewhere classified: Secondary | ICD-10-CM

## 2015-06-20 MED ORDER — MIDAZOLAM HCL 5 MG/5ML IJ SOLN
INTRAMUSCULAR | Status: AC
  Administered 2015-06-20: 2 mg via INTRAVENOUS
  Filled 2015-06-20: qty 5

## 2015-06-20 MED ORDER — CEFAZOLIN SODIUM 1 G IJ SOLR
INTRAMUSCULAR | Status: AC
  Administered 2015-06-20: 12:00:00 via INTRAVENOUS
  Filled 2015-06-20: qty 10

## 2015-06-20 MED ORDER — TRIAMCINOLONE ACETONIDE 40 MG/ML IJ SUSP
INTRAMUSCULAR | Status: AC
  Administered 2015-06-20: 12:00:00
  Filled 2015-06-20: qty 1

## 2015-06-20 MED ORDER — LIDOCAINE HCL (PF) 1 % IJ SOLN
INTRAMUSCULAR | Status: AC
  Filled 2015-06-20: qty 5

## 2015-06-20 MED ORDER — MIDAZOLAM HCL 5 MG/5ML IJ SOLN: 5.0000 mg | Freq: Once | INTRAMUSCULAR | Status: DC

## 2015-06-20 MED ORDER — FENTANYL CITRATE (PF) 100 MCG/2ML IJ SOLN
INTRAMUSCULAR | Status: AC
  Administered 2015-06-20: 100 ug
  Filled 2015-06-20: qty 2

## 2015-06-20 MED ORDER — BUPIVACAINE HCL (PF) 0.25 % IJ SOLN
INTRAMUSCULAR | Status: AC
  Administered 2015-06-20: 12:00:00
  Filled 2015-06-20: qty 30

## 2015-06-20 MED ORDER — FENTANYL CITRATE (PF) 100 MCG/2ML IJ SOLN: 100.0000 ug | Freq: Once | INTRAMUSCULAR | Status: DC

## 2015-06-20 MED ORDER — ORPHENADRINE CITRATE 30 MG/ML IJ SOLN: 60.0000 mg | Freq: Once | INTRAMUSCULAR | Status: DC

## 2015-06-20 MED ORDER — LACTATED RINGERS IV SOLN: 1000.0000 mL | INTRAVENOUS | Status: DC

## 2015-06-20 MED ORDER — CEFUROXIME AXETIL 250 MG PO TABS: 250.0000 mg | ORAL_TABLET | Freq: Two times a day (BID) | ORAL | Status: DC

## 2015-06-20 MED ORDER — CEFAZOLIN SODIUM 1-5 GM-% IV SOLN: 1.0000 g | Freq: Once | INTRAVENOUS | Status: DC

## 2015-06-20 MED ORDER — TRIAMCINOLONE ACETONIDE 40 MG/ML IJ SUSP: 40.0000 mg | Freq: Once | INTRAMUSCULAR | Status: DC

## 2015-06-20 MED ORDER — BUPIVACAINE HCL (PF) 0.25 % IJ SOLN: 30.0000 mL | Freq: Once | INTRAMUSCULAR | Status: DC

## 2015-06-20 MED ORDER — ORPHENADRINE CITRATE 30 MG/ML IJ SOLN
INTRAMUSCULAR | Status: AC
  Administered 2015-06-20: 12:00:00
  Filled 2015-06-20: qty 2

## 2015-06-20 NOTE — Progress Notes (Signed)
Safety precautions to be maintained throughout the outpatient stay will include: orient to surroundings, keep bed in low position, maintain call bell within reach at all times, provide assistance with transfer out of bed and ambulation.  

## 2015-06-20 NOTE — Patient Instructions (Addendum)
PLAN   Continue present medications at this time. Please get Ceftin antibiotic today and begin taking Ceftin antibiotic today as prescribed  F/U PCP  Thies for evaliation of  BP and general medical  condition  F/U surgical evaluation as discussed  F/U neurological evaluation. May consider [facility] EMG studies and other studies   May consider radiofrequency rhizolysis or intraspinal procedures pending response to present treatment and F/U evaluation   Patient to call [facility] should patient have concerns prior to scheduled return appointmenFacet Blocks Patient Information  Description: The facets are joints in the spine between the vertebrae.  Like any joints in the body, facets can become irritated and painful.  Arthritis can also effect the facets.  By injecting steroids and local anesthetic in and around these joints, we can temporarily block the nerve supply to them.  Steroids act directly on irritated nerves and tissues to reduce selling and inflammation which often leads to decreased pain.  Facet blocks may be done anywhere along the spine from the neck to the low back depending upon the location of your pain.   After numbing the skin with local anesthetic (like Novocaine), a small needle is passed onto the facet joints under x-ray guidance.  You may experience a sensation of pressure while this is being done.  The entire block usually lasts about 15-25 minutes.   Conditions which may be treated by facet blocks:   Low back/buttock pain  Neck/shoulder pain  Certain types of headaches  Preparation for the injection:  1. Do not eat any solid food or dairy products within 6 hours of your appointment. 2. You may drink clear liquid up to 2 hours before appointment.  Clear liquids include water, black coffee, juice or soda.  No milk or cream please. 3. You may take your regular medication, including pain medications, with a sip of water before your appointment.  Diabetics  should hold regular insulin (if taken separately) and take 1/2 normal NPH dose the morning of the procedure.  Carry some sugar containing items with you to your appointment. 4. A driver must accompany you and be prepared to drive you home after your procedure. 5. Bring all your current medications with you. 6. An IV may be inserted and sedation may be given at the discretion of the physician. 7. A blood pressure cuff, EKG and other monitors will often be applied during the procedure.  Some patients may need to have extra oxygen administered for a short period. 8. You will be asked to provide medical information, including your allergies and medications, prior to the procedure.  We must know immediately if you are taking blood thinners (like Coumadin/Warfarin) or if you are allergic to IV iodine contrast (dye).  We must know if you could possible be pregnant.  Possible side-effects:   Bleeding from needle site  Infection (rare, may require surgery)  Nerve injury (rare)  Numbness & tingling (temporary)  Difficulty urinating (rare, temporary)  Spinal headache (a headache worse with upright posture)  Light-headedness (temporary)  Pain at injection site (serveral days)  Decreased blood pressure (rare, temporary)  Weakness in arm/leg (temporary)  Pressure sensation in back/neck (temporary)   Call if you experience:   Fever/chills associated with headache or increased back/neck pain  Headache worsened by an upright position  New onset, weakness or numbness of an extremity below the injection site  Hives or difficulty breathing (go to the emergency room)  Inflammation or drainage at the injection site(s)  Severe  back/neck pain greater than usual  New symptoms which are concerning to you  Please note:  Although the local anesthetic injected can often make your back or neck feel good for several hours after the injection, the pain will likely return. It takes 3-7 days for  steroids to work.  You may not notice any pain relief for at least one week.  If effective, we will often do a series of 2-3 injections spaced 3-6 weeks apart to maximally decrease your pain.  After the initial series, you may be a candidate for a more permanent nerve block of the facets.  If you have any questions, please call #[phone] [facility] Discharge Inst[facility]  General Discharge Instructions :  If you need to reach your doctor call: Monday-Friday 8:00 am - 4:00 pm at [phone] or toll free [phone].  After clinic hours [phone] to have operator reach doctor.  Bring all of your medication bottles to all your appointments in the pain clinic.  To cancel or reschedule your appointment with Pain Management please remember to call 24 hours in advance to avoid a fee.  Refer to the educational materials which you have been given on: General Risks, I had my Procedure. Discharge Instructions, Post Sedation.  Post Procedure Instructions:  The drugs you were given will stay in your system until tomorrow, so for the next 24 hours you should not drive, make any legal decisions or drink any alcoholic beverages.  You may eat anything you prefer, but it is better to start with liquids then soups and crackers, and gradually work up to solid foods.  Please notify your doctor immediately if you have any unusual bleeding, trouble breathing or pain that is not related to your normal pain.  Depending on the type of procedure that was done, some parts of your body may feel week and/or numb.  This usually clears up by tonight or the next day.  Walk with the use of an assistive device or accompanied by an adult for the 24 hours.  You may use ice on the affected area for the first 24 hours.  Put ice in a Ziploc bag and cover with a towel and place against area 15 minutes on 15 minutes off.  You may switch to heat after 24 hours.

## 2015-06-20 NOTE — Progress Notes (Signed)
Subjective:    Patient ID: Monique Castro, female    DOB: [birth year], 45 y.o.   MRN: [id number]  HPI  PROCEDURE PERFORMED: Lumbar facet (medial branch block)   NOTE: The patient is a 45 y.o. female who returns to [facility] for further evaluation and treatment of pain involving the lumbar and lower extremity region. MRI  revealed the patient to be with evidence of Degenerative disc disease lumbar spine Small focal extraforaminal disc protrusion on the left at L4-5 potentially irritating the left L4 nerve root. Focal central disc protrusion at L5-S1 possibly irritating both S1 nerve roots. . The patient is with reproduction of severe pain with palpation over the lumbar facet lumbar paraspinal musculature region. 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 The risks, benefits, and expectations of the procedure have been discussed and explained to the patient who was understanding and in agreement with suggested treatment plan. We will proceed with interventional treatment as discussed and as explained to the patient who was understanding and wished to proceed with procedure as planned.   DESCRIPTION OF PROCEDURE: Lumbar facet (medial branch block) with IV Versed, IV fentanyl conscious sedation, EKG, blood pressure, pulse, and pulse oximetry monitoring. The procedure was performed with the patient in the prone position. Betadine prep of proposed entry site performed.   NEEDLE PLACEMENT AT: Left L 2 lumbar facet (medial branch block). Under fluoroscopic guidance with oblique orientation of 15 degrees, a 22-gauge needle was inserted at the L 2 vertebral body level with needle placed at the targeted area of Burton's Eye or Eye of the [place] Dog with documentation of needle placement in the superior and lateral border of targeted area of Burton's Eye or Eye of the Scotty Dog with oblique orientation of 15 degrees. Following documentation of  needle placement at the L 2 vertebral body level, needle placement was then accomplished at the L 3 vertebral body level.   NEEDLE PLACEMENT AT L3, L4, and L5 VERTEBRAL BODY LEVELS ON THE LEFT SIDE The procedure was performed at the L3, L4, and L5 vertebral body levels exactly as was performed at the L 2 vertebral body level utilizing the same technique and under fluoroscopic guidance.  NEEDLE PLACEMENT AT THE SACRAL ALA with AP view of the lumbosacral spine. With the patient in the prone position, Betadine prep of proposed entry site accomplished, a 22 gauge needle was inserted in the region of the sacral ala (groove formed by the superior articulating process of S1 and the sacral wing). Following documentation of needle placement at the sacral ala,   Needle placement was then verified at all levels on lateral view. Following documentation of needle placement at all levels on lateral view and following negative aspiration for heme and CSF, each level was injected with 1 mL of 0.25% bupivacaine with Kenalog.     LUMBAR FACET, MEDIAL BRANCH NERVE, BLOCKS PERFORMED ON THE RIGHT SIDE   The procedure was performed on the right side exactly as was performed on the left side at the same levels and utilizing the same technique under fluoroscopic guidance.   Myoneural block injections of the thoracic region Following Betadine prep of proposed entry site a 22-gauge needle was inserted in the thoracic musculature region and following negative aspiration 1 cc of 0.25% bupivacaine with Norflex was injected for myoneural block injection of the thoracic region times to.       The patient tolerated the procedure well. A total  of 40 mg of Kenalog was utilized for the procedure.   PLAN:  1. Medications: The patient will continue presently prescribed medications. 2. May consider modification of treatment regimen at time of return appointment pending response to treatment rendered on today's visit. 3. The  patient is to follow-up with primary care physician   Dr. Harrington Challenger for further evaluation of blood pressure and general medical condition status post steroid injection performed on today's visit. 4. Surgical follow-up evaluation. Has been addressed  5. Neurological follow-up evaluation. Has been addressed        The patient may be candidate for radiofrequency procedures, implantation type procedures, and other treatment pending response to treatment and follow-up evaluation. 6. The patient has been advised to call the [facility] Management Center prior to scheduled return appointment should there be significant change in condition or should patient have other concerns regarding condition prior to scheduled return appointment.  The patient is understanding and in agreement with suggested treatment plan.      Review of Systems     Objective:   Physical Exam        Assessment & Plan:

## 2015-06-27 ENCOUNTER — Telehealth: Payer: Self-pay

## 2015-06-27 ENCOUNTER — Other Ambulatory Visit: Payer: Self-pay | Admitting: Orthopedic Surgery

## 2015-06-27 DIAGNOSIS — M533 Sacrococcygeal disorders, not elsewhere classified: Secondary | ICD-10-CM

## 2015-06-27 NOTE — Telephone Encounter (Signed)
Patient wants to know can she come and pick up her scripts today that Dr. Metta Monique Castro is suppose to prescribe her

## 2015-06-27 NOTE — Telephone Encounter (Signed)
Monique Castro             Please call patient and schedule her for an eval appointment. We have received permission to prescribe but patient will need to have eval appointment prior to writing scripts per Dr. Metta Clines. Thank you in advance ----Victorino Dike

## 2015-07-08 ENCOUNTER — Ambulatory Visit
Admission: RE | Admit: 2015-07-08 | Discharge: 2015-07-08 | Disposition: A | Discharge status: Home / Self Care | Payer: Worker's Compensation | Source: Ambulatory Visit | Attending: Orthopedic Surgery | Admitting: Orthopedic Surgery

## 2015-07-08 DIAGNOSIS — M533 Sacrococcygeal disorders, not elsewhere classified: Secondary | ICD-10-CM

## 2015-07-08 NOTE — Discharge Instructions (Signed)

## 2015-07-11 ENCOUNTER — Encounter: Payer: Self-pay | Admitting: Pain Medicine

## 2015-07-11 ENCOUNTER — Ambulatory Visit: Payer: Self-pay | Admitting: Pain Medicine

## 2015-07-11 ENCOUNTER — Ambulatory Visit: Payer: BLUE CROSS/BLUE SHIELD | Attending: Pain Medicine | Admitting: Pain Medicine

## 2015-07-11 VITALS — BP 134/98 | HR 110 | Temp 98.3°F | Resp 16 | Ht 62.0 in | Wt 130.0 lb

## 2015-07-11 DIAGNOSIS — M533 Sacrococcygeal disorders, not elsewhere classified: Secondary | ICD-10-CM | POA: Diagnosis not present

## 2015-07-11 DIAGNOSIS — M419 Scoliosis, unspecified: Secondary | ICD-10-CM | POA: Insufficient documentation

## 2015-07-11 DIAGNOSIS — M545 Low back pain: Secondary | ICD-10-CM | POA: Diagnosis present

## 2015-07-11 DIAGNOSIS — M47816 Spondylosis without myelopathy or radiculopathy, lumbar region: Secondary | ICD-10-CM

## 2015-07-11 DIAGNOSIS — M5136 Other intervertebral disc degeneration, lumbar region: Secondary | ICD-10-CM | POA: Insufficient documentation

## 2015-07-11 DIAGNOSIS — IMO0002 Reserved for concepts with insufficient information to code with codable children: Secondary | ICD-10-CM

## 2015-07-11 DIAGNOSIS — M797 Fibromyalgia: Secondary | ICD-10-CM | POA: Insufficient documentation

## 2015-07-11 DIAGNOSIS — M5126 Other intervertebral disc displacement, lumbar region: Secondary | ICD-10-CM | POA: Insufficient documentation

## 2015-07-11 DIAGNOSIS — M5416 Radiculopathy, lumbar region: Secondary | ICD-10-CM

## 2015-07-11 DIAGNOSIS — G629 Polyneuropathy, unspecified: Secondary | ICD-10-CM

## 2015-07-11 DIAGNOSIS — M79606 Pain in leg, unspecified: Secondary | ICD-10-CM | POA: Diagnosis present

## 2015-07-11 MED ORDER — OXYMORPHONE HCL 10 MG PO TABS: ORAL_TABLET | ORAL | Status: DC

## 2015-07-11 MED ORDER — METAXALONE 800 MG PO TABS: ORAL_TABLET | ORAL | Status: DC

## 2015-07-11 NOTE — Patient Instructions (Addendum)
PLAN   Continue present medications Opana and Skelaxin  Block of nerves to the sacroiliac joint to be performed at time of return appointment  F/U PCP  for evaliation  of  BP and general medical  condition  F/U surgical evaluation. May consider pending follow-up evaluations  F/U neurological evaluation. May consider pending follow-up evaluations  May consider radiofrequency rhizolysis or intraspinal procedures pending response to present treatment and F/U evaluation   Patient to call Pain Management Center should patient have concerns prior to scheduled return appointment.Pain Management Discharge Instructions  General Discharge Instructions :  If you need to reach your doctor call: Monday-Friday 8:00 am - 4:00 pm at [phone] or toll free [phone].  After clinic hours [phone] to have operator reach doctor.  Bring all of your medication bottles to all your appointments in the pain clinic.  To cancel or reschedule your appointment with Pain Management please remember to call 24 hours in advance to avoid a fee.  Refer to the educational materials which you have been given on: General Risks, I had my Procedure. Discharge Instructions, Post Sedation.  Post Procedure Instructions:  The drugs you were given will stay in your system until tomorrow, so for the next 24 hours you should not drive, make any legal decisions or drink any alcoholic beverages.  You may eat anything you prefer, but it is better to start with liquids then soups and crackers, and gradually work up to solid foods.  Please notify your doctor immediately if you have any unusual bleeding, trouble breathing or pain that is not related to your normal pain.  Depending on the type of procedure that was done, some parts of your body may feel week and/or numb.  This usually clears up by tonight or the next day.  Walk with the use of an assistive device or accompanied by an adult for the 24 hours.  You may  use ice on the affected area for the first 24 hours.  Put ice in a Ziploc bag and cover with a towel and place against area 15 minutes on 15 minutes off.  You may switch to heat after 24 hours.Sacroiliac (SI) Joint Injection Patient Information  Description: The sacroiliac joint connects the scrum (very low back and tailbone) to the ilium (a pelvic bone which also forms half of the hip joint).  Normally this joint experiences very little motion.  When this joint becomes inflamed or unstable low back and or hip and pelvis pain may result.  Injection of this joint with local anesthetics (numbing medicines) and steroids can provide diagnostic information and reduce pain.  This injection is performed with the aid of x-ray guidance into the tailbone area while you are lying on your stomach.   You may experience an electrical sensation down the leg while this is being done.  You may also experience numbness.  We also may ask if we are reproducing your normal pain during the injection.  Conditions which may be treated SI injection:   Low back, buttock, hip or leg pain  Preparation for the Injection:  1. Do not eat any solid food or dairy products within 6 hours of your appointment.  2. You may drink clear liquids up to 2 hours before appointment.  Clear liquids include water, black coffee, juice or soda.  No milk or cream please. 3. You may take your regular medications, including pain medications with a sip of water before your appointment.  Diabetics should hold regular insulin (if take  separately) and take 1/2 normal NPH dose the morning of the procedure.  Carry some sugar containing items with you to your appointment. 4. A driver must accompany you and be prepared to drive you home after your procedure. 5. Bring all of your current medications with you. 6. An IV may be inserted and sedation may be given at the discretion of the physician. 7. A blood pressure cuff, EKG and other monitors will often be  applied during the procedure.  Some patients may need to have extra oxygen administered for a short period.  8. You will be asked to provide medical information, including your allergies, prior to the procedure.  We must know immediately if you are taking blood thinners (like Coumadin/Warfarin) or if you are allergic to IV iodine contrast (dye).  We must know if you could possible be pregnant.  Possible side effects:   Bleeding from needle site  Infection (rare, may require surgery)  Nerve injury (rare)  Numbness & tingling (temporary)  A brief convulsion or seizure  Light-headedness (temporary)  Pain at injection site (several days)  Decreased blood pressure (temporary)  Weakness in the leg (temporary)   Call if you experience:   New onset weakness or numbness of an extremity below the injection site that last more than 8 hours.  Hives or difficulty breathing ( go to the emergency room)  Inflammation or drainage at the injection site  Any new symptoms which are concerning to you  Please note:  Although the local anesthetic injected can often make your back/ hip/ buttock/ leg feel good for several hours after the injections, the pain will likely return.  It takes 3-7 days for steroids to work in the sacroiliac area.  You may not notice any pain relief for at least that one week.  If effective, we will often do a series of three injections spaced 3-6 weeks apart to maximally decrease your pain.  After the initial series, we generally will wait some months before a repeat injection of the same type.  If you have any questions, please call [phone] [facility]  GENERAL RISKS AND COMPLICATIONS  What are the risk, side effects and possible complications? Generally speaking, most procedures are safe.  However, with any procedure there are risks, side effects, and the possibility of complications.  The risks and complications are  dependent upon the sites that are lesioned, or the type of nerve block to be performed.  The closer the procedure is to the spine, the more serious the risks are.  Great care is taken when placing the radio frequency needles, block needles or lesioning probes, but sometimes complications can occur. 1. Infection: Any time there is an injection through the skin, there is a risk of infection.  This is why sterile conditions are used for these blocks.  There are four possible types of infection. 1. Localized skin infection. 2. Central Nervous System Infection-This can be in the form of Meningitis, which can be deadly. 3. Epidural Infections-This can be in the form of an epidural abscess, which can cause pressure inside of the spine, causing compression of the spinal cord with subsequent paralysis. This would require an emergency surgery to decompress, and there are no guarantees that the patient would recover from the paralysis. 4. Discitis-This is an infection of the intervertebral discs.  It occurs in about 1% of discography procedures.  It is difficult to treat and it may lead to surgery.  2. Pain: the needles have to go through skin and soft tissues, will cause soreness.       3. Damage to internal structures:  The nerves to be lesioned may be near blood vessels or    other nerves which can be potentially damaged.       4. Bleeding: Bleeding is more common if the patient is taking blood thinners such as  aspirin, Coumadin, Ticiid, Plavix, etc., or if he/she have some genetic predisposition  such as hemophilia. Bleeding into the spinal canal can cause compression of the spinal  cord with subsequent paralysis.  This would require an emergency surgery to  decompress and there are no guarantees that the patient would recover from the  paralysis.       5. Pneumothorax:  Puncturing of a lung is a possibility, every time a needle is introduced in  the area of the chest or upper back.  Pneumothorax refers  to free air around the  collapsed lung(s), inside of the thoracic cavity (chest cavity).  Another two possible  complications related to a similar event would include: Hemothorax and Chylothorax.   These are variations of the Pneumothorax, where instead of air around the collapsed  lung(s), you may have blood or chyle, respectively.       6. Spinal headaches: They may occur with any procedures in the area of the spine.       7. Persistent CSF (Cerebro-Spinal Fluid) leakage: This is a rare problem, but may occur  with prolonged intrathecal or epidural catheters either due to the formation of a fistulous  track or a dural tear.       8. Nerve damage: By working so close to the spinal cord, there is always a possibility of  nerve damage, which could be as serious as a permanent spinal cord injury with  paralysis.       9. Death:  Although rare, severe deadly allergic reactions known as "Anaphylactic  reaction" can occur to any of the medications used.      10. Worsening of the symptoms:  We can always make thing worse.  What are the chances of something like this happening? Chances of any of this occuring are extremely low.  By statistics, you have more of a chance of getting killed in a motor vehicle accident: while driving to the hospital than any of the above occurring .  Nevertheless, you should be aware that they are possibilities.  In general, it is similar to taking a shower.  Everybody knows that you can slip, hit your head and get killed.  Does that mean that you should not shower again?  Nevertheless always keep in mind that statistics do not mean anything if you happen to be on the wrong side of them.  Even if a procedure has a 1 (one) in a 1,000,000 (million) chance of going wrong, it you happen to be that one..Also, keep in mind that by statistics, you have more of a chance of having something go wrong when taking medications.  Who should not have this procedure? If you are on a blood thinning  medication (e.g. Coumadin, Plavix, see list of "Blood Thinners"), or if you have an active infection going on, you should not have the procedure.  If you are taking any blood thinners, please inform your physician.  How should I prepare for this procedure?  Do not eat or drink anything at least six hours prior to the procedure.  Bring a driver  with you .  It cannot be a taxi.  Come accompanied by an adult that can drive you back, and that is strong enough to help you if your legs get weak or numb from the local anesthetic.  Take all of your medicines the morning of the procedure with just enough water to swallow them.  If you have diabetes, make sure that you are scheduled to have your procedure done first thing in the morning, whenever possible.  If you have diabetes, take only half of your insulin dose and notify our nurse that you have done so as soon as you arrive at the clinic.  If you are diabetic, but only take blood sugar pills (oral hypoglycemic), then do not take them on the morning of your procedure.  You may take them after you have had the procedure.  Do not take aspirin or any aspirin-containing medications, at least eleven (11) days prior to the procedure.  They may prolong bleeding.  Wear loose fitting clothing that may be easy to take off and that you would not mind if it got stained with Betadine or blood.  Do not wear any jewelry or perfume  Remove any nail coloring.  It will interfere with some of our monitoring equipment.  NOTE: Remember that this is not meant to be interpreted as a complete list of all possible complications.  Unforeseen problems may occur.  BLOOD THINNERS The following drugs contain aspirin or other products, which can cause increased bleeding during surgery and should not be taken for 2 weeks prior to and 1 week after surgery.  If you should need take something for relief of minor pain, you may take acetaminophen which is found in Tylenol,m  Datril, Anacin-3 and Panadol. It is not blood thinner. The products listed below are.  Do not take any of the products listed below in addition to any listed on your instruction sheet.  A.P.C or A.P.C with Codeine Codeine Phosphate Capsules #3 Ibuprofen Ridaura  ABC compound Congesprin Imuran rimadil  Advil Cope Indocin Robaxisal  Alka-Seltzer Effervescent Pain Reliever and Antacid Coricidin or Coricidin-D  Indomethacin Rufen  Alka-Seltzer plus Cold Medicine Cosprin Ketoprofen S-A-C Tablets  Anacin Analgesic Tablets or Capsules Coumadin Korlgesic Salflex  Anacin Extra Strength Analgesic tablets or capsules CP-2 Tablets Lanoril Salicylate  Anaprox Cuprimine Capsules Levenox Salocol  Anexsia-D Dalteparin Magan Salsalate  Anodynos Darvon compound Magnesium Salicylate Sine-off  Ansaid Dasin Capsules Magsal Sodium Salicylate  Anturane Depen Capsules Marnal Soma  APF Arthritis pain formula Dewitt's Pills Measurin Stanback  Argesic Dia-Gesic Meclofenamic Sulfinpyrazone  Arthritis Bayer Timed Release Aspirin Diclofenac Meclomen Sulindac  Arthritis pain formula Anacin Dicumarol Medipren Supac  Analgesic (Safety coated) Arthralgen Diffunasal Mefanamic Suprofen  Arthritis Strength Bufferin Dihydrocodeine Mepro Compound Suprol  Arthropan liquid Dopirydamole Methcarbomol with Aspirin Synalgos  ASA tablets/Enseals Disalcid Micrainin Tagament  Ascriptin Doan's Midol Talwin  Ascriptin A/D Dolene Mobidin Tanderil  Ascriptin Extra Strength Dolobid Moblgesic Ticlid  Ascriptin with Codeine Doloprin or Doloprin with Codeine Momentum Tolectin  Asperbuf Duoprin Mono-gesic Trendar  Aspergum Duradyne Motrin or Motrin IB Triminicin  Aspirin plain, buffered or enteric coated Durasal Myochrisine Trigesic  Aspirin Suppositories Easprin Nalfon Trillsate  Aspirin with Codeine Ecotrin Regular or Extra Strength Naprosyn Uracel  Atromid-S Efficin Naproxen Ursinus  Auranofin Capsules Elmiron Neocylate Vanquish   Axotal Emagrin Norgesic Verin  Azathioprine Empirin or Empirin with Codeine Normiflo Vitamin E  Azolid Emprazil Nuprin Voltaren  Bayer Aspirin plain, buffered or children's or timed BC Tablets or powders  Encaprin Orgaran Warfarin Sodium  Buff-a-Comp Enoxaparin Orudis Zorpin  Buff-a-Comp with Codeine Equegesic Os-Cal-Gesic   Buffaprin Excedrin plain, buffered or Extra Strength Oxalid   Bufferin Arthritis Strength Feldene Oxphenbutazone   Bufferin plain or Extra Strength Feldene Capsules Oxycodone with Aspirin   Bufferin with Codeine Fenoprofen Fenoprofen Pabalate or Pabalate-SF   Buffets II Flogesic Panagesic   Buffinol plain or Extra Strength Florinal or Florinal with Codeine Panwarfarin   Buf-Tabs Flurbiprofen Penicillamine   Butalbital Compound Four-way cold tablets Penicillin   Butazolidin Fragmin Pepto-Bismol   Carbenicillin Geminisyn Percodan   Carna Arthritis Reliever Geopen Persantine   Carprofen Gold's salt Persistin   Chloramphenicol Goody's Phenylbutazone   Chloromycetin Haltrain Piroxlcam   Clmetidine heparin Plaquenil   Cllnoril Hyco-pap Ponstel   Clofibrate Hydroxy chloroquine Propoxyphen         Before stopping any of these medications, be sure to consult the physician who ordered them.  Some, such as Coumadin (Warfarin) are ordered to prevent or treat serious conditions such as "deep thrombosis", "pumonary embolisms", and other heart problems.  The amount of time that you may need off of the medication may also vary with the medication and the reason for which you were taking it.  If you are taking any of these medications, please make sure you notify your pain physician before you undergo any procedures.

## 2015-07-11 NOTE — Progress Notes (Signed)
Safety precautions to be maintained throughout the outpatient stay will include: orient to surroundings, keep bed in low position, maintain call bell within reach at all times, provide assistance with transfer out of bed and ambulation.  
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
## 2015-07-14 ENCOUNTER — Ambulatory Visit: Payer: Self-pay | Admitting: Pain Medicine

## 2015-07-20 ENCOUNTER — Encounter: Payer: Self-pay | Admitting: Pain Medicine

## 2015-07-20 ENCOUNTER — Ambulatory Visit: Payer: BLUE CROSS/BLUE SHIELD | Attending: Pain Medicine | Admitting: Pain Medicine

## 2015-07-20 VITALS — BP 154/86 | HR 108 | Temp 97.8°F | Resp 18 | Ht 62.0 in | Wt 130.0 lb

## 2015-07-20 DIAGNOSIS — IMO0002 Reserved for concepts with insufficient information to code with codable children: Secondary | ICD-10-CM

## 2015-07-20 DIAGNOSIS — M47816 Spondylosis without myelopathy or radiculopathy, lumbar region: Secondary | ICD-10-CM

## 2015-07-20 DIAGNOSIS — M79606 Pain in leg, unspecified: Secondary | ICD-10-CM | POA: Diagnosis present

## 2015-07-20 DIAGNOSIS — M5126 Other intervertebral disc displacement, lumbar region: Secondary | ICD-10-CM | POA: Insufficient documentation

## 2015-07-20 DIAGNOSIS — M5136 Other intervertebral disc degeneration, lumbar region: Secondary | ICD-10-CM

## 2015-07-20 DIAGNOSIS — M545 Low back pain: Secondary | ICD-10-CM | POA: Diagnosis present

## 2015-07-20 DIAGNOSIS — G629 Polyneuropathy, unspecified: Secondary | ICD-10-CM

## 2015-07-20 DIAGNOSIS — M5416 Radiculopathy, lumbar region: Secondary | ICD-10-CM

## 2015-07-20 DIAGNOSIS — M791 Myalgia: Secondary | ICD-10-CM | POA: Insufficient documentation

## 2015-07-20 DIAGNOSIS — M533 Sacrococcygeal disorders, not elsewhere classified: Secondary | ICD-10-CM

## 2015-07-20 MED ORDER — BUPIVACAINE HCL (PF) 0.25 % IJ SOLN: 30.0000 mL | Freq: Once | INTRAMUSCULAR | Status: DC

## 2015-07-20 MED ORDER — ORPHENADRINE CITRATE 30 MG/ML IJ SOLN: 60.0000 mg | Freq: Once | INTRAMUSCULAR | Status: DC

## 2015-07-20 MED ORDER — CEFUROXIME AXETIL 250 MG PO TABS: 250.0000 mg | ORAL_TABLET | Freq: Two times a day (BID) | ORAL | Status: DC

## 2015-07-20 MED ORDER — TRIAMCINOLONE ACETONIDE 40 MG/ML IJ SUSP: 40.0000 mg | Freq: Once | INTRAMUSCULAR | Status: DC

## 2015-07-20 MED ORDER — BUPIVACAINE HCL (PF) 0.25 % IJ SOLN
INTRAMUSCULAR | Status: AC
  Administered 2015-07-20: 14:00:00
  Filled 2015-07-20: qty 30

## 2015-07-20 MED ORDER — FLUCONAZOLE 150 MG PO TABS: ORAL_TABLET | ORAL | Status: DC

## 2015-07-20 MED ORDER — CEFAZOLIN SODIUM 1-5 GM-% IV SOLN: 1.0000 g | Freq: Once | INTRAVENOUS | Status: DC

## 2015-07-20 MED ORDER — CEFAZOLIN SODIUM 1 G IJ SOLR
INTRAMUSCULAR | Status: AC
  Administered 2015-07-20: 14:00:00 via INTRAVENOUS
  Filled 2015-07-20: qty 10

## 2015-07-20 MED ORDER — ORPHENADRINE CITRATE 30 MG/ML IJ SOLN
INTRAMUSCULAR | Status: AC
  Administered 2015-07-20: 14:00:00
  Filled 2015-07-20: qty 2

## 2015-07-20 NOTE — Patient Instructions (Addendum)
PLAN   Continue present medications at this time. Please get Ceftin antibiotic and Diflucan today and begin taking Ceftin antibiotic and Diflucan today as prescribed  F/U PCP  Thies for evaliation of  BP and general medical  condition  F/U surgical evaluation as discussed. Need to consider evaluation of hand by hand surgeon as we discussed as well  F/U neurological evaluation. May consider [facility] EMG studies and other studies   May consider radiofrequency rhizolysis or intraspinal procedures pending response to present treatment and F/U evaluation   Patient to call Pain Management Center should patient have concerns prior to scheduled return appointmenPain Management Discharge Instructions  General Discharge Instructions :  If you need to reach your doctor call: Monday-Friday 8:00 am - 4:00 pm at [phone] or toll free [phone].  After clinic hours [phone] to have operator reach doctor.  Bring all of your medication bottles to all your appointments in the pain clinic.  To cancel or reschedule your appointment with Pain Management please remember to call 24 hours in advance to avoid a fee.  Refer to the educational materials which you have been given on: General Risks, I had my Procedure. Discharge Instructions, Post Sedation.  Post Procedure Instructions:  The drugs you were given will stay in your system until tomorrow, so for the next 24 hours you should not drive, make any legal decisions or drink any alcoholic beverages.  You may eat anything you prefer, but it is better to start with liquids then soups and crackers, and gradually work up to solid foods.  Please notify your doctor immediately if you have any unusual bleeding, trouble breathing or pain that is not related to your normal pain.  Depending on the type of procedure that was done, some parts of your body may feel week and/or numb.  This usually clears up by tonight or the next day.  Walk with the use of an  assistive device or accompanied by an adult for the 24 hours.  You may use ice on the affected area for the first 24 hours.  Put ice in a Ziploc bag and cover with a towel and place against area 15 minutes on 15 minutes off.  You may switch to heat after 24 hours.

## 2015-07-20 NOTE — Progress Notes (Signed)
Subjective:    Patient ID: Monique Castro, female    DOB: Mar 06, 1971, 45 y.o.   MRN: [id number]  HPI  PROCEDURE:  Block of nerves to the sacroiliac joint.   NOTE:  The patient is a 45 y.o. female who [facility] for further evaluation and treatment of pain involving the lower back and lower extremity region with pain in the region of the buttocks as well. Prior MRI studies reveal degenerative disc disease lumbar spine Small focal extraforaminal disc protrusion on the left at L4-5 potentially irritating the left L4 nerve root. Focal central disc protrusion at L5-S1 possibly irritating both S1 nerve roots.  the patient is with reproduction of severe pain with palpation over the PSIS and PII S regions There is concern regarding a significant component of the patient's pain being due to sacroiliac joint dysfunction The risks, benefits, expectations of the procedure have been discussed and explained to the patient who is understanding and willing to proceed with interventional treatment in attempt to decrease severity of patient's symptoms, minimize the risk of medication escalation and  hopefully retard the progression of the patient's symptoms. We will proceed with what is felt to be a medically necessary procedure, block of nerves to the sacroiliac joint.   DESCRIPTION OF PROCEDURE:  Block of nerves to the sacroiliac joint.   The patient was taken to the fluoroscopy suite. With the patient in the prone position with EKG, blood pressure, pulse and pulse oximetry monitoring. Betadine prep of proposed entry site was performed.   Block of nerves at the L5 vertebral body level.   With the patient in prone position, under fluoroscopic guidance, a 22 -gauge needle was inserted at the L5 vertebral body level on the left side. With 15 degrees oblique orientation a 22 -gauge needle was inserted in the region known as Burton's eye or eye of the Scotty dog. Following documentation of  needle placement in the area of Burton's eye or eye of the Scotty dog under fluoroscopic guidance, needle placement was then accomplished at the sacral ala level on the left side.   Needle placement at the sacral ala.   With the patient in prone position under fluoroscopic guidance with AP view of the lumbosacral spine, a 22 -gauge needle was inserted in the region known as the sacral ala on the left side. Following documentation of needle placement on the left side under fluoroscopic guidance needle placement was then accomplished at the S1 foramen level.   Needle placement at the S1 foramen level.   With the patient in prone position under fluoroscopic guidance with AP view of the lumbosacral spine and cephalad orientation, a 22 -gauge needle was inserted at the superior and lateral border of the S1 foramen on the left side. Following documentation of needle placement at the S1 foramen level on the left side, needle placement was then accomplished at the S2 foramen level on the left side.   Needle placement at the S2 foramen level.   With the patient in prone position with AP view of the lumbosacral spine with cephalad orientation, a 22 - gauge needle was inserted at the superior and lateral border of the S2 foramen under fluoroscopic guidance on the left side. Following needle placement at the L5 vertebral body level, sacral ala, S1 foramen and S2 foramen on the left side, needle placement was verified on lateral view under fluoroscopic guidance.  Following needle placement documentation on lateral view, each needle was injected  with 1 mL of 0.25% bupivacaine and Kenalog.   BLOCK OF THE NERVES TO SACROILIAC JOINT ON THE RIGHT SIDE The procedure was performed on the right side at the same levels as was performed on the left side and utilizing the same technique as on the left side and was performed under fluoroscopic guidance as on the left side   A total of  of Kenalog was utilized for the  procedure.   PLAN:  1. Medications: The patient will continue presently prescribed medications Opana and Skelaxin 2. The patient will be considered for modification of treatment regimen pending response to the procedure performed on today's visit.  3. The patient is to follow-up with primary care physician Dr.Thies for evaluation of blood pressure and general medical condition following the procedure performed on today's visit.  4. Surgical evaluation as discussed.  5. Neurological evaluation as discussed.  6. The patient may be a candidate for radiofrequency procedures, implantation devices and other treatment pending response to treatment performed on today's visit and follow-up evaluation.  7. The patient has been advised to adhere to proper body mechanics and to avoid activities which may exacerbate the patient's symptoms.   [facility] appointment to [facility] as scheduled.    Review of Systems     Objective:   Physical Exam        Assessment & Plan:

## 2015-07-20 NOTE — Progress Notes (Signed)
Safety precautions to be maintained throughout the outpatient stay will include: orient to surroundings, keep bed in low position, maintain call bell within reach at all times, provide assistance with transfer out of bed and ambulation.   Patient here for procedure. 

## 2015-07-21 NOTE — Telephone Encounter (Signed)
Called after procedure.

## 2015-08-09 ENCOUNTER — Ambulatory Visit: Payer: BLUE CROSS/BLUE SHIELD | Attending: Pain Medicine | Admitting: Pain Medicine

## 2015-08-09 ENCOUNTER — Encounter: Payer: Self-pay | Admitting: Pain Medicine

## 2015-08-09 VITALS — BP 138/80 | HR 112 | Temp 98.2°F | Resp 16 | Ht 62.0 in | Wt 130.0 lb

## 2015-08-09 DIAGNOSIS — M5136 Other intervertebral disc degeneration, lumbar region: Secondary | ICD-10-CM

## 2015-08-09 DIAGNOSIS — M5126 Other intervertebral disc displacement, lumbar region: Secondary | ICD-10-CM | POA: Diagnosis not present

## 2015-08-09 DIAGNOSIS — M5116 Intervertebral disc disorders with radiculopathy, lumbar region: Secondary | ICD-10-CM | POA: Insufficient documentation

## 2015-08-09 DIAGNOSIS — M5416 Radiculopathy, lumbar region: Secondary | ICD-10-CM

## 2015-08-09 DIAGNOSIS — M533 Sacrococcygeal disorders, not elsewhere classified: Secondary | ICD-10-CM | POA: Insufficient documentation

## 2015-08-09 DIAGNOSIS — M545 Low back pain: Secondary | ICD-10-CM | POA: Diagnosis present

## 2015-08-09 DIAGNOSIS — M797 Fibromyalgia: Secondary | ICD-10-CM | POA: Diagnosis not present

## 2015-08-09 DIAGNOSIS — M79605 Pain in left leg: Secondary | ICD-10-CM | POA: Diagnosis present

## 2015-08-09 DIAGNOSIS — M419 Scoliosis, unspecified: Secondary | ICD-10-CM | POA: Insufficient documentation

## 2015-08-09 DIAGNOSIS — IMO0002 Reserved for concepts with insufficient information to code with codable children: Secondary | ICD-10-CM

## 2015-08-09 DIAGNOSIS — G629 Polyneuropathy, unspecified: Secondary | ICD-10-CM

## 2015-08-09 DIAGNOSIS — M47816 Spondylosis without myelopathy or radiculopathy, lumbar region: Secondary | ICD-10-CM

## 2015-08-09 DIAGNOSIS — M79604 Pain in right leg: Secondary | ICD-10-CM | POA: Diagnosis present

## 2015-08-09 MED ORDER — OXYMORPHONE HCL 10 MG PO TABS: ORAL_TABLET | ORAL | Status: DC

## 2015-08-09 MED ORDER — OXYMORPHONE HCL 10 MG PO TABS: 10.0000 mg | ORAL_TABLET | Freq: Four times a day (QID) | ORAL | Status: DC

## 2015-08-09 NOTE — Progress Notes (Signed)
Safety precautions to be maintained throughout the outpatient stay will include: orient to surroundings, keep bed in low position, maintain call bell within reach at all times, provide assistance with transfer out of bed and ambulation.  

## 2015-08-09 NOTE — Patient Instructions (Addendum)
PLAN   Continue present medications Opana and Skelaxin  F/U PCP  for evaliation  of  BP and general medical  condition  F/U surgical evaluation. May consider pending follow-up evaluations  F/U neurological evaluation. May consider PNCV/EMG studies and other studies pending follow-up evaluations  May consider radiofrequency rhizolysis or intraspinal procedures pending response to present treatment and F/U evaluation   Patient to call [facility] should patient have concerns prior to scheduled return appointmentSacroiliac (SI) Joint Injection Patient Information  Description: The sacroiliac joint connects the scrum (very low back and tailbone) to the ilium (a pelvic bone which also forms half of the hip joint).  Normally this joint experiences very little motion.  When this joint becomes inflamed or unstable low back and or hip and pelvis pain may result.  Injection of this joint with local anesthetics (numbing medicines) and steroids can provide diagnostic information and reduce pain.  This injection is performed with the aid of x-ray guidance into the tailbone area while you are lying on your stomach.   You may experience an electrical sensation down the leg while this is being done.  You may also experience numbness.  We also may ask if we are reproducing your normal pain during the injection.  Conditions which may be treated SI injection:   Low back, buttock, hip or leg pain  Preparation for the Injection:  1. Do not eat any solid food or dairy products within 8 hours of your appointment.  2. You may drink clear liquids up to 3 hours before appointment.  Clear liquids include water, black coffee, juice or soda.  No milk or cream please. 3. You may take your regular medications, including pain medications with a sip of water before your appointment.  Diabetics should hold regular insulin (if take separately) and take 1/2 normal NPH dose the morning of the procedure.  Carry  some sugar containing items with you to your appointment. 4. A driver must accompany you and be prepared to drive you home after your procedure. 5. Bring all of your current medications with you. 6. An IV may be inserted and sedation may be given at the discretion of the physician. 7. A blood pressure cuff, EKG and other monitors will often be applied during the procedure.  Some patients may need to have extra oxygen administered for a short period.  8. You will be asked to provide medical information, including your allergies, prior to the procedure.  We must know immediately if you are taking blood thinners (like Coumadin/Warfarin) or if you are allergic to IV iodine contrast (dye).  We must know if you could possible be pregnant.  Possible side effects:   Bleeding from needle site  Infection (rare, may require surgery)  Nerve injury (rare)  Numbness & tingling (temporary)  A brief convulsion or seizure  Light-headedness (temporary)  Pain at injection site (several days)  Decreased blood pressure (temporary)  Weakness in the leg (temporary)   Call if you experience:   New onset weakness or numbness of an extremity below the injection site that last more than 8 hours.  Hives or difficulty breathing ( go to the emergency room)  Inflammation or drainage at the injection site  Any new symptoms which are concerning to you  Please note:  Although the local anesthetic injected can often make your back/ hip/ buttock/ leg feel good for several hours after the injections, the pain will likely return.  It takes 3-7 days for steroids to  work in the sacroiliac area.  You may not notice any pain relief for at least that one week.  If effective, we will often do a series of three injections spaced 3-6 weeks apart to maximally decrease your pain.  After the initial series, we generally will wait some months before a repeat injection of the same type.  If you have any questions, please  call [phone] [facility]  Selective Nerve Root Block Patient Information  Description: Specific nerve roots exit the spinal canal and these nerves can be compressed and inflamed by a bulging disc and bone spurs.  By injecting steroids on the nerve root, we can potentially decrease the inflammation surrounding these nerves, which often leads to decreased pain.  Also, by injecting local anesthesia on the nerve root, this can provide [place] helpful information to give to your referring doctor if it decreases your pain.  Selective nerve root blocks can be done along the spine from the neck to the low back depending on the location of your pain.   After numbing the skin with local anesthesia, a small needle is passed to the nerve root and the position of the needle is verified using x-ray pictures.  After the needle is in correct position, we then deposit the medication.  You may experience a pressure sensation while this is being done.  The entire block usually lasts less than 15 minutes.  Conditions that may be treated with selective nerve root blocks:  Low back and leg pain  Spinal stenosis  Diagnostic block prior to potential surgery  Neck and arm pain  Post laminectomy syndrome  Preparation for the injection:  9. Do not eat any solid food or dairy products within 8 hours of your appointment. 10. You may drink clear liquids up to 3 hours before an appointment.  Clear liquids include water, black coffee, juice or soda.  No milk or cream please. 11. You may take your regular medications, including pain medications, with a sip of water before your appointment.  Diabetics should hold regular insulin (if taken separately) and take 1/2 normal NPH dose the morning of the procedure.  Carry some sugar containing items with you to your appointment. 12. A driver must accompany you and be prepared to drive you home after your procedure. 13. Bring all your current  medications with you. 14. An IV may be inserted and sedation may be given at the discretion of the physician. 15. A blood pressure cuff, EKG, and other monitors will often be applied during the procedure.  Some patients may need to have extra oxygen administered for a short period. 16. You will be asked to provide medical information, including allergies, prior to the procedure.  We must know immediately if you are taking blood  Thinners (like Coumadin) or if you are allergic to IV iodine contrast (dye).  Possible side-effects: All are usually temporary  Bleeding from needle site  Light headedness  Numbness and tingling  Decreased blood pressure  Weakness in arms/legs  Pressure sensation in back/neck  Pain at injection site (several days)  Possible complications: All are extremely rare  Infection  Nerve injury  Spinal headache (a headache wore with upright position)  Call if you experience:  Fever/chills associated with headache or increased back/neck pain  Headache worsened by an upright position  New onset weakness or numbness of an extremity below the injection site  Hives or difficulty breathing (go to the emergency room)  Inflammation or  drainage at the injection site(s)  Severe back/neck pain greater than usual  New symptoms which are concerning to you  Please note:  Although the local anesthetic injected can often make your back or neck feel good for several hours after the injection the pain will likely return.  It takes 3-5 days for steroids to work on the nerve root. You may not notice any pain relief for at least one week.  If effective, we will often do a series of 3 injections spaced 3-6 weeks apart to maximally decrease your pain.    If you have any questions, please call [phone] [facility] Clinic[facility] RISKS AND COMPLICATIONS  What are the risk, side effects and possible complications? Generally speaking,  most procedures are safe.  However, with any procedure there are risks, side effects, and the possibility of complications.  The risks and complications are dependent upon the sites that are lesioned, or the type of nerve block to be performed.  The closer the procedure is to the spine, the more serious the risks are.  Great care is taken when placing the radio frequency needles, block needles or lesioning probes, but sometimes complications can occur. 1. Infection: Any time there is an injection through the skin, there is a risk of infection.  This is why sterile conditions are used for these blocks.  There are four possible types of infection. 1. Localized skin infection. 2. Central Nervous System Infection-This can be in the form of Meningitis, which can be deadly. 3. Epidural Infections-This can be in the form of an epidural abscess, which can cause pressure inside of the spine, causing compression of the spinal cord with subsequent paralysis. This would require an emergency surgery to decompress, and there are no guarantees that the patient would recover from the paralysis. 4. Discitis-This is an infection of the intervertebral discs.  It occurs in about 1% of discography procedures.  It is difficult to treat and it may lead to surgery.        2. Pain: the needles have to go through skin and soft tissues, will cause soreness.       3. Damage to internal structures:  The nerves to be lesioned may be near blood vessels or    other nerves which can be potentially damaged.       4. Bleeding: Bleeding is more common if the patient is taking blood thinners such as  aspirin, Coumadin, Ticiid, Plavix, etc., or if he/she have some genetic predisposition  such as hemophilia. Bleeding into the spinal canal can cause compression of the spinal  cord with subsequent paralysis.  This would require an emergency surgery to  decompress and there are no guarantees that the patient would recover from the  paralysis.        5. Pneumothorax:  Puncturing of a lung is a possibility, every time a needle is introduced in  the area of the chest or upper back.  Pneumothorax refers to free air around the  collapsed lung(s), inside of the thoracic cavity (chest cavity).  Another two possible  complications related to a similar event would include: Hemothorax and Chylothorax.   These are variations of the Pneumothorax, where instead of air around the collapsed  lung(s), you may have blood or chyle, respectively.       6. Spinal headaches: They may occur with any procedures in the area of the spine.       7. Persistent CSF (Cerebro-Spinal Fluid) leakage: This is  a rare problem, but may occur  with prolonged intrathecal or epidural catheters either due to the formation of a fistulous  track or a dural tear.       8. Nerve damage: By working so close to the spinal cord, there is always a possibility of  nerve damage, which could be as serious as a permanent spinal cord injury with  paralysis.       9. Death:  Although rare, severe deadly allergic reactions known as "Anaphylactic  reaction" can occur to any of the medications used.      10. Worsening of the symptoms:  We can always make thing worse.  What are the chances of something like this happening? Chances of any of this occuring are extremely low.  By statistics, you have more of a chance of getting killed in a motor vehicle accident: while driving to the hospital than any of the above occurring .  Nevertheless, you should be aware that they are possibilities.  In general, it is similar to taking a shower.  Everybody knows that you can slip, hit your head and get killed.  Does that mean that you should not shower again?  Nevertheless always keep in mind that statistics do not mean anything if you happen to be on the wrong side of them.  Even if a procedure has a 1 (one) in a 1,000,000 (million) chance of going wrong, it you happen to be that one..Also, keep in mind that by  statistics, you have more of a chance of having something go wrong when taking medications.  Who should not have this procedure? If you are on a blood thinning medication (e.g. Coumadin, Plavix, see list of "Blood Thinners"), or if you have an active infection going on, you should not have the procedure.  If you are taking any blood thinners, please inform your physician.  How should I prepare for this procedure?  Do not eat or drink anything at least six hours prior to the procedure.  Bring a driver with you .  It cannot be a taxi.  Come accompanied by an adult that can drive you back, and that is strong enough to help you if your legs get weak or numb from the local anesthetic.  Take all of your medicines the morning of the procedure with just enough water to swallow them.  If you have diabetes, make sure that you are scheduled to have your procedure done first thing in the morning, whenever possible.  If you have diabetes, take only half of your insulin dose and notify our nurse that you have done so as soon as you arrive at the clinic.  If you are diabetic, but only take blood sugar pills (oral hypoglycemic), then do not take them on the morning of your procedure.  You may take them after you have had the procedure.  Do not take aspirin or any aspirin-containing medications, at least eleven (11) days prior to the procedure.  They may prolong bleeding.  Wear loose fitting clothing that may be easy to take off and that you would not mind if it got stained with Betadine or blood.  Do not wear any jewelry or perfume  Remove any nail coloring.  It will interfere with some of our monitoring equipment.  NOTE: Remember that this is not meant to be interpreted as a complete list of all possible complications.  Unforeseen problems may occur.  BLOOD THINNERS The following drugs contain aspirin or other products, which  can cause increased bleeding during surgery and should not be taken for 2  weeks prior to and 1 week after surgery.  If you should need take something for relief of minor pain, you may take acetaminophen which is found in Tylenol,m Datril, Anacin-3 and Panadol. It is not blood thinner. The products listed below are.  Do not take any of the products listed below in addition to any listed on your instruction sheet.  A.P.C or A.P.C with Codeine Codeine Phosphate Capsules #3 Ibuprofen Ridaura  ABC compound Congesprin Imuran rimadil  Advil Cope Indocin Robaxisal  Alka-Seltzer Effervescent Pain Reliever and Antacid Coricidin or Coricidin-D  Indomethacin Rufen  Alka-Seltzer plus Cold Medicine Cosprin Ketoprofen S-A-C Tablets  Anacin Analgesic Tablets or Capsules Coumadin Korlgesic Salflex  Anacin Extra Strength Analgesic tablets or capsules CP-2 Tablets Lanoril Salicylate  Anaprox Cuprimine Capsules Levenox Salocol  Anexsia-D Dalteparin Magan Salsalate  Anodynos Darvon compound Magnesium Salicylate Sine-off  Ansaid Dasin Capsules Magsal Sodium Salicylate  Anturane Depen Capsules Marnal Soma  APF Arthritis pain formula Dewitt's Pills Measurin Stanback  Argesic Dia-Gesic Meclofenamic Sulfinpyrazone  Arthritis Bayer Timed Release Aspirin Diclofenac Meclomen Sulindac  Arthritis pain formula Anacin Dicumarol Medipren Supac  Analgesic (Safety coated) Arthralgen Diffunasal Mefanamic Suprofen  Arthritis Strength Bufferin Dihydrocodeine Mepro Compound Suprol  Arthropan liquid Dopirydamole Methcarbomol with Aspirin Synalgos  ASA tablets/Enseals Disalcid Micrainin Tagament  Ascriptin Doan's Midol Talwin  Ascriptin A/D Dolene Mobidin Tanderil  Ascriptin Extra Strength Dolobid Moblgesic Ticlid  Ascriptin with Codeine Doloprin or Doloprin with Codeine Momentum Tolectin  Asperbuf Duoprin Mono-gesic Trendar  Aspergum Duradyne Motrin or Motrin IB Triminicin  Aspirin plain, buffered or enteric coated Durasal Myochrisine Trigesic  Aspirin Suppositories Easprin Nalfon Trillsate   Aspirin with Codeine Ecotrin Regular or Extra Strength Naprosyn Uracel  Atromid-S Efficin Naproxen Ursinus  Auranofin Capsules Elmiron Neocylate Vanquish  Axotal Emagrin Norgesic Verin  Azathioprine Empirin or Empirin with Codeine Normiflo Vitamin E  Azolid Emprazil Nuprin Voltaren  Bayer Aspirin plain, buffered or children's or timed BC Tablets or powders Encaprin Orgaran Warfarin Sodium  Buff-a-Comp Enoxaparin Orudis Zorpin  Buff-a-Comp with Codeine Equegesic Os-Cal-Gesic   Buffaprin Excedrin plain, buffered or Extra Strength Oxalid   Bufferin Arthritis Strength Feldene Oxphenbutazone   Bufferin plain or Extra Strength Feldene Capsules Oxycodone with Aspirin   Bufferin with Codeine Fenoprofen Fenoprofen Pabalate or Pabalate-SF   Buffets II Flogesic Panagesic   Buffinol plain or Extra Strength Florinal or Florinal with Codeine Panwarfarin   Buf-Tabs Flurbiprofen Penicillamine   Butalbital Compound Four-way cold tablets Penicillin   Butazolidin Fragmin Pepto-Bismol   Carbenicillin Geminisyn Percodan   Carna Arthritis Reliever Geopen Persantine   Carprofen Gold's salt Persistin   Chloramphenicol Goody's Phenylbutazone   Chloromycetin Haltrain Piroxlcam   Clmetidine heparin Plaquenil   Cllnoril Hyco-pap Ponstel   Clofibrate Hydroxy chloroquine Propoxyphen         Before stopping any of these medications, be sure to consult the physician who ordered them.  Some, such as Coumadin (Warfarin) are ordered to prevent or treat serious conditions such as "deep thrombosis", "pumonary embolisms", and other heart problems.  The amount of time that you may need off of the medication may also vary with the medication and the reason for which you were taking it.  If you are taking any of these medications, please make sure you notify your pain physician before you undergo any procedures.         PLAN   Continue present medications Opana and Skelaxin.  Block of  nerves to the sacroiliac  joint to be performed at time of return appointment  F/U PCP  for evaliation  of  BP and general medical  condition  F/U surgical evaluation. May consider pending follow-up evaluations  F/U neurological evaluation. May consider pending follow-up evaluations  May consider radiofrequency rhizolysis or intraspinal procedures pending response to present treatment and F/U evaluation   Patient to call [facility] should patient have concerns prior to scheduled return appointment.

## 2015-08-09 NOTE — Progress Notes (Signed)
   Subjective:    Patient ID: Monique Castro, female    DOB: 1971/04/11, 45 y.o.   MRN: [id number]  HPI  The patient is a 45 year old female who returns to pain management for further evaluation and treatment of pain involving the lower back and lower extremity regions. The patient has had improvement of prior treatment performed in pain management Center no some return of pain of significant degree at this time. Patient states the pain is aggravated by standing walking twisting turning maneuvers with pain becoming more intense as the day progresses. The patient denies any trauma change in events of daily living the call significant change in patient's condition. The patient is a significant tends to palpation with prolonged standing walking. We have discussed interventional treatment including treatment of the sacroiliac joint and may consider patient for radiofrequency rhizolysis pending response to the present treatment and follow-up evaluation. The patient was with understanding and agreed to suggested treatment plan. The patient will continue Opana as prescribed at this time and we will consider additional modifications of treatment pending follow-up evaluation. All agreed to suggested treatment plan.     Review of Systems     Objective:   Physical Exam  There was tenderness to palpation of the splenius capitis and occipitalis muscles region a mild degree with mild tenderness over the cervical facet cervical paraspinal musculature region. Palpation over the thoracic facet thoracic paraspinal musculature region was with mild tinnitus to palpation. There was tenderness of the acromial clavicular and glenohumeral joint region a mild degree. Patient appeared to be with slightly decreased grip strength.. Tinel and Phalen's maneuver were without increase of pain of significant degree. There was tends to palpation of the epicondyles of the elbow. No increased warmth erythema in the region of the  elbows noted. Palpation over the lumbar paraspinal musculatures and lumbar facet region was attends palpation of moderate degree with lateral bending rotation extension and palpation over the lumbar facets reproducing moderate discomfort. There was moderate to moderately severe tenderness to palpation over the PSIS and PII S region. There was mild tenderness of the greater trochanteric region and iliotibial band region. Palpation of the gluteal and piriformis musculature regions reproduce moderate discomfort as well the patient was with positive Patrick's maneuver left greater than the right. There was negative clonus negative Homans. Straight leg raise was tolerates approximately 20 without a definite increase of pain with dorsiflexion noted. There was negative clonus negative Homans. Abdomen was nontender with no costovertebral tenderness noted      Assessment & Plan:     Degenerative disc disease lumbar spine Small focal extraforaminal disc protrusion on the left at L4-5 potentially irritating the left L4 nerve root. Focal central disc protrusion at L5-S1 possibly irritating both S1 nerve roots  Lumbar radiculopathy  Lumbar facet syndrome  Sacroiliac joint dysfunction  Scoliosis  Fibromyalgia           PLAN   Continue present medications Opana and Skelaxin.  Block of nerves to the sacroiliac joint to be performed at time of return appointment  F/U PCP  for evaliation  of  BP and general medical  condition  F/U surgical evaluation. May consider pending follow-up evaluations  F/U neurological evaluation. May consider pending follow-up evaluations  May consider radiofrequency rhizolysis or intraspinal procedures pending response to present treatment and F/U evaluation   Patient to call Pain Management Center should patient have concerns prior to scheduled return appointment.

## 2015-08-24 ENCOUNTER — Ambulatory Visit: Payer: BLUE CROSS/BLUE SHIELD | Attending: Pain Medicine | Admitting: Pain Medicine

## 2015-08-24 ENCOUNTER — Encounter: Payer: Self-pay | Admitting: Pain Medicine

## 2015-08-24 VITALS — BP 122/88 | HR 104 | Temp 97.5°F | Resp 16 | Ht 62.0 in | Wt 130.0 lb

## 2015-08-24 DIAGNOSIS — M5126 Other intervertebral disc displacement, lumbar region: Secondary | ICD-10-CM | POA: Diagnosis not present

## 2015-08-24 DIAGNOSIS — G629 Polyneuropathy, unspecified: Secondary | ICD-10-CM

## 2015-08-24 DIAGNOSIS — M79606 Pain in leg, unspecified: Secondary | ICD-10-CM | POA: Diagnosis present

## 2015-08-24 DIAGNOSIS — M545 Low back pain: Secondary | ICD-10-CM | POA: Diagnosis present

## 2015-08-24 DIAGNOSIS — M533 Sacrococcygeal disorders, not elsewhere classified: Secondary | ICD-10-CM

## 2015-08-24 DIAGNOSIS — M5127 Other intervertebral disc displacement, lumbosacral region: Secondary | ICD-10-CM | POA: Insufficient documentation

## 2015-08-24 DIAGNOSIS — M5136 Other intervertebral disc degeneration, lumbar region: Secondary | ICD-10-CM | POA: Insufficient documentation

## 2015-08-24 DIAGNOSIS — M5416 Radiculopathy, lumbar region: Secondary | ICD-10-CM

## 2015-08-24 DIAGNOSIS — M47816 Spondylosis without myelopathy or radiculopathy, lumbar region: Secondary | ICD-10-CM

## 2015-08-24 MED ORDER — BUPIVACAINE HCL (PF) 0.25 % IJ SOLN: 30.0000 mL | Freq: Once | INTRAMUSCULAR | Status: DC

## 2015-08-24 MED ORDER — ORPHENADRINE CITRATE 30 MG/ML IJ SOLN
INTRAMUSCULAR | Status: AC
  Filled 2015-08-24: qty 2

## 2015-08-24 MED ORDER — CEFAZOLIN SODIUM 1-5 GM-% IV SOLN: 1.0000 g | Freq: Once | INTRAVENOUS | Status: DC

## 2015-08-24 MED ORDER — CEFAZOLIN SODIUM 1 G IJ SOLR
INTRAMUSCULAR | Status: AC
  Administered 2015-08-24: 1 g via INTRAVENOUS
  Filled 2015-08-24: qty 10

## 2015-08-24 MED ORDER — TRIAMCINOLONE ACETONIDE 40 MG/ML IJ SUSP: 40.0000 mg | Freq: Once | INTRAMUSCULAR | Status: DC

## 2015-08-24 MED ORDER — ORPHENADRINE CITRATE 30 MG/ML IJ SOLN: 60.0000 mg | Freq: Once | INTRAMUSCULAR | Status: DC

## 2015-08-24 MED ORDER — MIDAZOLAM HCL 5 MG/5ML IJ SOLN: 5.0000 mg | Freq: Once | INTRAMUSCULAR | Status: DC

## 2015-08-24 MED ORDER — BUPIVACAINE HCL (PF) 0.25 % IJ SOLN
INTRAMUSCULAR | Status: AC
  Administered 2015-08-24: 13:00:00
  Filled 2015-08-24: qty 30

## 2015-08-24 MED ORDER — FENTANYL CITRATE (PF) 100 MCG/2ML IJ SOLN: 100.0000 ug | Freq: Once | INTRAMUSCULAR | Status: DC

## 2015-08-24 MED ORDER — LACTATED RINGERS IV SOLN: 1000.0000 mL | INTRAVENOUS | Status: DC

## 2015-08-24 MED ORDER — CEFUROXIME AXETIL 250 MG PO TABS: 250.0000 mg | ORAL_TABLET | Freq: Two times a day (BID) | ORAL | Status: DC

## 2015-08-24 MED ORDER — FLUCONAZOLE 100 MG PO TABS: ORAL_TABLET | ORAL | Status: DC

## 2015-08-24 MED ORDER — TRIAMCINOLONE ACETONIDE 40 MG/ML IJ SUSP
INTRAMUSCULAR | Status: AC
  Administered 2015-08-24: 13:00:00
  Filled 2015-08-24: qty 1

## 2015-08-24 NOTE — Progress Notes (Signed)
Safety precautions to be maintained throughout the outpatient stay will include: orient to surroundings, keep bed in low position, maintain call bell within reach at all times, provide assistance with transfer out of bed and ambulation.  

## 2015-08-24 NOTE — Progress Notes (Signed)
Subjective:    Patient ID: Monique Castro, female    DOB: 07/24/1970, 45 y.o.   MRN: [id number]  HPI  PROCEDURE:  Block of nerves to the sacroiliac joint.   NOTE:  The patient is a 45 y.o. female who returns to the Pain Management Center for further evaluation and treatment of pain involving the lower back and lower extremity region with pain in the region of the buttocks as well. Prior MRI studies reveal degenerative disc disease lumbar spine Small focal extraforaminal disc protrusion on the left at L4-5 potentially irritating the left L4 nerve root. Focal central disc protrusion at L5-S1 possibly irritating both S1 nerve roots.  the patient is with reproduction of severe pain with palpation over the PSIS and PII S regions  There is concern regarding a significant component of the patient's pain being due to sacroiliac joint dysfunction The risks, benefits, expectations of the procedure have been discussed and explained to the patient who is understanding and willing to proceed with interventional treatment in attempt to decrease severity of patient's symptoms, minimize the risk of medication escalation and  hopefully retard the progression of the patient's symptoms. We will proceed with what is felt to be a medically necessary procedure, block of nerves to the sacroiliac joint.   DESCRIPTION OF PROCEDURE:  Block of nerves to the sacroiliac joint.   The patient was taken to the fluoroscopy suite. With the patient in the prone position with EKG, blood pressure, pulse and pulse oximetry monitoring  Betadine prep of proposed entry site was performed.   Block of nerves at the L5 vertebral body level.   With the patient in prone position, under fluoroscopic guidance, a 22 -gauge needle was inserted at the L5 vertebral body level on the left side. With 15 degrees oblique orientation a 22 -gauge needle was inserted in the region known as Burton's eye or eye of the Scotty dog. Following documentation  of needle placement in the area of Burton's eye or eye of the Scotty dog under fluoroscopic guidance, needle placement was then accomplished at the sacral ala level on the left side.   Needle placement at the sacral ala.   With the patient in prone position under fluoroscopic guidance with AP view of the lumbosacral spine, a 22 -gauge needle was inserted in the region known as the sacral ala on the left side. Following documentation of needle placement on the left side under fluoroscopic guidance needle placement was then accomplished at the S1 foramen level.   Needle placement at the S1 foramen level.   With the patient in prone position under fluoroscopic guidance with AP view of the lumbosacral spine and cephalad orientation, a 22 -gauge needle was inserted at the superior and lateral border of the S1 foramen on the left side. Following documentation of needle placement at the S1 foramen level on the left side, needle placement was then accomplished at the S2 foramen level on the left side.   Needle placement at the S2 foramen level.   With the patient in prone position with AP view of the lumbosacral spine with cephalad orientation, a 22 - gauge needle was inserted at the superior and lateral border of the S2 foramen under fluoroscopic guidance on the left side. Following needle placement at the L5 vertebral body level, sacral ala, S1 foramen and S2 foramen on the left side, needle placement was verified on lateral view under fluoroscopic guidance.  Following needle placement documentation on lateral view, each needle  was injected with 1 mL of 0.25% bupivacaine and Kenalog.   BLOCK OF THE NERVES TO SACROILIAC JOINT ON THE RIGHT SIDE The procedure was performed on the right side at the same levels as was performed on the left side and utilizing the same technique as on the left side and was performed under fluoroscopic guidance as on the left side   A total of 10mg  of Kenalog was utilized for  the procedure.   PLAN:  1. Medications: The patient will continue presently prescribed medications Skelaxin and Panadol.  2. The patient will be considered for modification of treatment regimen pending response to the procedure performed on today's visit.  3. The patient is to follow-up with primary care physician for evaluation of blood pressure and general medical condition following the procedure performed on today's visit.  4. Surgical evaluation as discussed.  5. Neurological evaluation as discussed.  6. The patient may be a candidate for radiofrequency procedures, implantation devices and other treatment pending response to treatment performed on today's visit and follow-up evaluation.  7. The patient has been advised to adhere to proper body mechanics and to avoid activities which may exacerbate the patient's symptoms.   Return appointment to Pain Management Center as scheduled.    Review of Systems     Objective:   Physical Exam        Assessment & Plan:

## 2015-08-24 NOTE — Patient Instructions (Addendum)
PLAN   Continue present medications at this time. Please get Ceftin antibiotic and Diflucan today and begin taking Ceftin antibiotic and Diflucan today as prescribed  F/U PCP  [facility] for evaliation of  BP and general medical  condition  F/U surgical evaluation as discussed. Need to consider evaluation of hand by hand surgeon as we discussed as well  F/U neurological evaluation. May consider [facility] EMG studies and other studies   May consider radiofrequency rhizolysis or intraspinal procedures pending response to present treatment and F/U evaluation   Patient to call Pain Management Center should patient have concerns prior to scheduled return appointment Pain Management Discharge Instructions  General Discharge Instructions :  If you need to reach your doctor call: Monday-Friday 8:00 am - 4:00 pm at [phone] or toll free [phone].  After clinic hours [phone] to have operator reach doctor.  Bring all of your medication bottles to all your appointments in the pain clinic.  To cancel or reschedule your appointment with Pain Management please remember to call 24 hours in advance to avoid a fee.  Refer to the educational materials which you have been given on: General Risks, I had my Procedure. Discharge Instructions, Post Sedation.  Post Procedure Instructions:  The drugs you were given will stay in your system until tomorrow, so for the next 24 hours you should not drive, make any legal decisions or drink any alcoholic beverages.  You may eat anything you prefer, but it is better to start with liquids then soups and crackers, and gradually work up to solid foods.  Please notify your doctor immediately if you have any unusual bleeding, trouble breathing or pain that is not related to your normal pain.  Depending on the type of procedure that was done, some parts of your body may feel week and/or numb.  This usually clears up by tonight or the next day.  Walk with the use of  an assistive device or accompanied by an adult for the 24 hours.  You may use ice on the affected area for the first 24 hours.  Put ice in a Ziploc bag and cover with a towel and place against area 15 minutes on 15 minutes off.  You may switch to heat after 24 hours. Prescriptions for Ceftin and Diflucan were sent to your pharmacy.

## 2015-08-25 ENCOUNTER — Telehealth: Payer: Self-pay

## 2015-08-25 NOTE — Telephone Encounter (Signed)
Post procedure phone call.  States she is doing well.  

## 2015-09-06 ENCOUNTER — Ambulatory Visit: Payer: BLUE CROSS/BLUE SHIELD | Attending: Pain Medicine | Admitting: Pain Medicine

## 2015-09-06 ENCOUNTER — Encounter: Payer: Self-pay | Admitting: Pain Medicine

## 2015-09-06 VITALS — BP 134/74 | HR 105 | Temp 98.0°F | Resp 16 | Ht 62.0 in | Wt 124.0 lb

## 2015-09-06 DIAGNOSIS — M5127 Other intervertebral disc displacement, lumbosacral region: Secondary | ICD-10-CM | POA: Diagnosis not present

## 2015-09-06 DIAGNOSIS — M5126 Other intervertebral disc displacement, lumbar region: Secondary | ICD-10-CM | POA: Insufficient documentation

## 2015-09-06 DIAGNOSIS — M5136 Other intervertebral disc degeneration, lumbar region: Secondary | ICD-10-CM

## 2015-09-06 DIAGNOSIS — M545 Low back pain: Secondary | ICD-10-CM | POA: Diagnosis present

## 2015-09-06 DIAGNOSIS — M47816 Spondylosis without myelopathy or radiculopathy, lumbar region: Secondary | ICD-10-CM

## 2015-09-06 DIAGNOSIS — M797 Fibromyalgia: Secondary | ICD-10-CM | POA: Diagnosis not present

## 2015-09-06 DIAGNOSIS — M533 Sacrococcygeal disorders, not elsewhere classified: Secondary | ICD-10-CM | POA: Insufficient documentation

## 2015-09-06 DIAGNOSIS — M5416 Radiculopathy, lumbar region: Secondary | ICD-10-CM

## 2015-09-06 DIAGNOSIS — IMO0002 Reserved for concepts with insufficient information to code with codable children: Secondary | ICD-10-CM

## 2015-09-06 DIAGNOSIS — M5116 Intervertebral disc disorders with radiculopathy, lumbar region: Secondary | ICD-10-CM | POA: Diagnosis not present

## 2015-09-06 DIAGNOSIS — M419 Scoliosis, unspecified: Secondary | ICD-10-CM | POA: Insufficient documentation

## 2015-09-06 DIAGNOSIS — M79606 Pain in leg, unspecified: Secondary | ICD-10-CM | POA: Diagnosis present

## 2015-09-06 DIAGNOSIS — G629 Polyneuropathy, unspecified: Secondary | ICD-10-CM

## 2015-09-06 MED ORDER — OXYMORPHONE HCL 10 MG PO TABS: ORAL_TABLET | ORAL | Status: DC

## 2015-09-06 MED ORDER — TIZANIDINE HCL 4 MG PO TABS: ORAL_TABLET | ORAL | Status: DC

## 2015-09-06 NOTE — Progress Notes (Signed)
Safety precautions to be maintained throughout the outpatient stay will include: orient to surroundings, keep bed in low position, maintain call bell within reach at all times, provide assistance with transfer out of bed and ambulation.  

## 2015-09-06 NOTE — Patient Instructions (Addendum)
PLAN   Continue present medications Opana and replaced Skelaxin with Zanaflex  Lumbar facet, medial branch nerves to be performed at time return appointment  F/U PCP  for evaliation  of  BP and general medical  condition  F/U surgical evaluation. May consider pending follow-up evaluations  F/U neurological evaluation. May consider PNCV/EMG studies and other studies pending follow-up evaluations  Ask the secretaries to inform you when insurance approves you for radiofrequency rhizolysis lumbar facet medial branch nerves  May consider radiofrequency rhizolysis or intraspinal procedures pending response to present treatment and F/U evaluation   Patient to call [facility] should patient have concerns prior to scheduled return appointmentGENERAL RISKS AND COMPLICATIONS  What are the risk, side effects and possible complications? Generally speaking, most procedures are safe.  However, with any procedure there are risks, side effects, and the possibility of complications.  The risks and complications are dependent upon the sites that are lesioned, or the type of nerve block to be performed.  The closer the procedure is to the spine, the more serious the risks are.  Great care is taken when placing the radio frequency needles, block needles or lesioning probes, but sometimes complications can occur. 1. Infection: Any time there is an injection through the skin, there is a risk of infection.  This is why sterile conditions are used for these blocks.  There are four possible types of infection. 1. Localized skin infection. 2. Central Nervous System Infection-This can be in the form of Meningitis, which can be deadly. 3. Epidural Infections-This can be in the form of an epidural abscess, which can cause pressure inside of the spine, causing compression of the spinal cord with subsequent paralysis. This would require an emergency surgery to decompress, and there are no guarantees that the  patient would recover from the paralysis. 4. Discitis-This is an infection of the intervertebral discs.  It occurs in about 1% of discography procedures.  It is difficult to treat and it may lead to surgery.        2. Pain: the needles have to go through skin and soft tissues, will cause soreness.       3. Damage to internal structures:  The nerves to be lesioned may be near blood vessels or    other nerves which can be potentially damaged.       4. Bleeding: Bleeding is more common if the patient is taking blood thinners such as  aspirin, Coumadin, Ticiid, Plavix, etc., or if he/she have some genetic predisposition  such as hemophilia. Bleeding into the spinal canal can cause compression of the spinal  cord with subsequent paralysis.  This would require an emergency surgery to  decompress and there are no guarantees that the patient would recover from the  paralysis.       5. Pneumothorax:  Puncturing of a lung is a possibility, every time a needle is introduced in  the area of the chest or upper back.  Pneumothorax refers to free air around the  collapsed lung(s), inside of the thoracic cavity (chest cavity).  Another two possible  complications related to a similar event would include: Hemothorax and Chylothorax.   These are variations of the Pneumothorax, where instead of air around the collapsed  lung(s), you may have blood or chyle, respectively.       6. Spinal headaches: They may occur with any procedures in the area of the spine.       7. Persistent CSF (Cerebro-Spinal Fluid) leakage:  This is a rare problem, but may occur  with prolonged intrathecal or epidural catheters either due to the formation of a fistulous  track or a dural tear.       8. Nerve damage: By working so close to the spinal cord, there is always a possibility of  nerve damage, which could be as serious as a permanent spinal cord injury with  paralysis.       9. Death:  Although rare, severe deadly allergic reactions known as  "Anaphylactic  reaction" can occur to any of the medications used.      10. Worsening of the symptoms:  We can always make thing worse.  What are the chances of something like this happening? Chances of any of this occuring are extremely low.  By statistics, you have more of a chance of getting killed in a motor vehicle accident: while driving to the hospital than any of the above occurring .  Nevertheless, you should be aware that they are possibilities.  In general, it is similar to taking a shower.  Everybody knows that you can slip, hit your head and get killed.  Does that mean that you should not shower again?  Nevertheless always keep in mind that statistics do not mean anything if you happen to be on the wrong side of them.  Even if a procedure has a 1 (one) in a 1,000,000 (million) chance of going wrong, it you happen to be that one..Also, keep in mind that by statistics, you have more of a chance of having something go wrong when taking medications.  Who should not have this procedure? If you are on a blood thinning medication (e.g. Coumadin, Plavix, see list of "Blood Thinners"), or if you have an active infection going on, you should not have the procedure.  If you are taking any blood thinners, please inform your physician.  How should I prepare for this procedure?  Do not eat or drink anything at least six hours prior to the procedure.  Bring a driver with you .  It cannot be a taxi.  Come accompanied by an adult that can drive you back, and that is strong enough to help you if your legs get weak or numb from the local anesthetic.  Take all of your medicines the morning of the procedure with just enough water to swallow them.  If you have diabetes, make sure that you are scheduled to have your procedure done first thing in the morning, whenever possible.  If you have diabetes, take only half of your insulin dose and notify our nurse that you have done so as soon as you arrive at the  clinic.  If you are diabetic, but only take blood sugar pills (oral hypoglycemic), then do not take them on the morning of your procedure.  You may take them after you have had the procedure.  Do not take aspirin or any aspirin-containing medications, at least eleven (11) days prior to the procedure.  They may prolong bleeding.  Wear loose fitting clothing that may be easy to take off and that you would not mind if it got stained with Betadine or blood.  Do not wear any jewelry or perfume  Remove any nail coloring.  It will interfere with some of our monitoring equipment.  NOTE: Remember that this is not meant to be interpreted as a complete list of all possible complications.  Unforeseen problems may occur.  BLOOD THINNERS The following drugs contain aspirin or  other products, which can cause increased bleeding during surgery and should not be taken for 2 weeks prior to and 1 week after surgery.  If you should need take something for relief of minor pain, you may take acetaminophen which is found in Tylenol,m Datril, Anacin-3 and Panadol. It is not blood thinner. The products listed below are.  Do not take any of the products listed below in addition to any listed on your instruction sheet.  A.P.C or A.P.C with Codeine Codeine Phosphate Capsules #3 Ibuprofen Ridaura  ABC compound Congesprin Imuran rimadil  Advil Cope Indocin Robaxisal  Alka-Seltzer Effervescent Pain Reliever and Antacid Coricidin or Coricidin-D  Indomethacin Rufen  Alka-Seltzer plus Cold Medicine Cosprin Ketoprofen S-A-C Tablets  Anacin Analgesic Tablets or Capsules Coumadin Korlgesic Salflex  Anacin Extra Strength Analgesic tablets or capsules CP-2 Tablets Lanoril Salicylate  Anaprox Cuprimine Capsules Levenox Salocol  Anexsia-D Dalteparin Magan Salsalate  Anodynos Darvon compound Magnesium Salicylate Sine-off  Ansaid Dasin Capsules Magsal Sodium Salicylate  Anturane Depen Capsules Marnal Soma  APF Arthritis pain  formula Dewitt's Pills Measurin Stanback  Argesic Dia-Gesic Meclofenamic Sulfinpyrazone  Arthritis Bayer Timed Release Aspirin Diclofenac Meclomen Sulindac  Arthritis pain formula Anacin Dicumarol Medipren Supac  Analgesic (Safety coated) Arthralgen Diffunasal Mefanamic Suprofen  Arthritis Strength Bufferin Dihydrocodeine Mepro Compound Suprol  Arthropan liquid Dopirydamole Methcarbomol with Aspirin Synalgos  ASA tablets/Enseals Disalcid Micrainin Tagament  Ascriptin Doan's Midol Talwin  Ascriptin A/D Dolene Mobidin Tanderil  Ascriptin Extra Strength Dolobid Moblgesic Ticlid  Ascriptin with Codeine Doloprin or Doloprin with Codeine Momentum Tolectin  Asperbuf Duoprin Mono-gesic Trendar  Aspergum Duradyne Motrin or Motrin IB Triminicin  Aspirin plain, buffered or enteric coated Durasal Myochrisine Trigesic  Aspirin Suppositories Easprin Nalfon Trillsate  Aspirin with Codeine Ecotrin Regular or Extra Strength Naprosyn Uracel  Atromid-S Efficin Naproxen Ursinus  Auranofin Capsules Elmiron Neocylate Vanquish  Axotal Emagrin Norgesic Verin  Azathioprine Empirin or Empirin with Codeine Normiflo Vitamin E  Azolid Emprazil Nuprin Voltaren  Bayer Aspirin plain, buffered or children's or timed BC Tablets or powders Encaprin Orgaran Warfarin Sodium  Buff-a-Comp Enoxaparin Orudis Zorpin  Buff-a-Comp with Codeine Equegesic Os-Cal-Gesic   Buffaprin Excedrin plain, buffered or Extra Strength Oxalid   Bufferin Arthritis Strength Feldene Oxphenbutazone   Bufferin plain or Extra Strength Feldene Capsules Oxycodone with Aspirin   Bufferin with Codeine Fenoprofen Fenoprofen Pabalate or Pabalate-SF   Buffets II Flogesic Panagesic   Buffinol plain or Extra Strength Florinal or Florinal with Codeine Panwarfarin   Buf-Tabs Flurbiprofen Penicillamine   Butalbital Compound Four-way cold tablets Penicillin   Butazolidin Fragmin Pepto-Bismol   Carbenicillin Geminisyn Percodan   Carna Arthritis Reliever Geopen  Persantine   Carprofen Gold's salt Persistin   Chloramphenicol Goody's Phenylbutazone   Chloromycetin Haltrain Piroxlcam   Clmetidine heparin Plaquenil   Cllnoril Hyco-pap Ponstel   Clofibrate Hydroxy chloroquine Propoxyphen         Before stopping any of these medications, be sure to consult the physician who ordered them.  Some, such as Coumadin (Warfarin) are ordered to prevent or treat serious conditions such as "deep thrombosis", "pumonary embolisms", and other heart problems.  The amount of time that you may need off of the medication may also vary with the medication and the reason for which you were taking it.  If you are taking any of these medications, please make sure you notify your pain physician before you undergo any procedures.         Radiofrequency Lesioning Radiofrequency lesioning is a procedure that is  performed to relieve pain. The procedure is often used for back, neck, or arm pain. Radiofrequency lesioning involves the use of a machine that creates radio waves to make heat. During the procedure, the heat is applied to the nerve that carries the pain signal. The heat damages the nerve and interferes with the pain signal. Pain relief usually lasts for 6 months to 1 year. LET [facility] CARE PROVIDER KNOW ABOUT: 2. Any allergies you have. 3. All medicines you are taking, including vitamins, herbs, eye drops, creams, and over-the-counter medicines. 4. Previous problems you or members of your family have had with the use of anesthetics. 5. Any blood disorders you have. 6. Previous surgeries you have had. 7. Any medical conditions you have. 8. Whether you are pregnant or may be pregnant. RISKS AND COMPLICATIONS Generally, this is a safe procedure. However, problems may occur, including:  Pain or soreness at the injection site.  Infection at the injection site.  Damage to nerves or blood vessels. BEFORE THE PROCEDURE  Ask your health care provider  about:  Changing or stopping your regular medicines. This is especially important if you are taking diabetes medicines or blood thinners.  Taking medicines such as aspirin and ibuprofen. These medicines can thin your blood. Do not take these medicines before your procedure if your health care provider instructs you not to.  Follow instructions from your health care provider about eating or drinking restrictions.  Plan to have someone take you home after the procedure.  If you go home right after the procedure, plan to have someone with you for 24 hours. PROCEDURE  You will be given one or more of the following:  A medicine to help you relax (sedative).  A medicine to numb the area (local anesthetic).  You will be awake during the procedure. You will need to be able to talk with the health care provider during the procedure.  With the help of a type of X-ray (fluoroscopy), the health care provider will insert a radiofrequency needle into the area to be treated.  Next, a wire that carries the radio waves (electrode) will be put through the radiofrequency needle. An electrical pulse will be sent through the electrode to verify the correct nerve. You will feel a tingling sensation, and you may have muscle twitching.  Then, the tissue that is around the needle tip will be heated by an electric current that is passed using the radiofrequency machine. This will numb the nerves.  A bandage (dressing) will be put on the insertion area after the procedure is done. The procedure may vary among health care providers and hospitals. AFTER THE PROCEDURE 12. Your blood pressure, heart rate, breathing rate, and blood oxygen level will be monitored often until the medicines you were given have worn off. 13. Return to your normal activities as directed by your health care provider.   This information is not intended to replace advice given to you by your health care provider. Make sure you discuss any  questions you have with your health care provider.   Document Released: 12/27/2010 Document Revised: 01/19/2015 Document Reviewed: 06/07/2014 Elsevier Interactive Patient Education [employer]

## 2015-09-06 NOTE — Progress Notes (Signed)
Subjective:    Patient ID: Monique Castro, female    DOB: January 12, 1971, 45 y.o.   MRN: [id number]  HPI  The patient is a 45 year old female who returns to pain management Center for further evaluation and treatment of pain involving the lumbar and lower extremity region. The patient states that her lumbar lower extremity pain is aggravated by twisting turning maneuvers with pain becoming more intense as patient spends more time on the feet. The patient states that lumbar facet, medial branch nerve, blocks of the more effective in terms of reducing her pain. We will consider patient for radiofrequency rhizolysis lumbar facets pending response to additional treatment as discussed and as explained to patient on today's visit. We will continue patient's medications consisting of Opana and we will place Skelaxin with Zanaflex. We will proceed with lumbar facet medial branch nerve, blocks at time return appointment. All were in agreement with suggested treatment plan     Review of Systems     Objective:   Physical Exam  There was tenderness to palpation of paraspinal muscular treat and cervical region cervical facet region palpation which reproduces pain of mild degree with mild tenderness of the splenius capitis and occipitalis musculature region. Palpation over the thoracic facet thoracic paraspinal must reason was attends to palpation of mild to moderate degree with moderate muscle spasms involving the lower thoracic paraspinal musculature region with no crepitus of the thoracic region noted. There was moderate tenderness to palpation of the acromioclavicular and glenohumeral joint region and patient was with unremarkable Spurling's maneuver and was with mild difficulty performing drop test. Tinel and Phalen's maneuver were without increased pain of significant degree and patient was with slightly decreased grip strength. There was tends to palpation of paraspinal misreading thoracic region thoracic  facet region with muscle spasms involving the lower thoracic paraspinal must reason a moderate degree with palpation over the lumbar facet lumbar paraspinal must reason reproducing most severe increase of patient's pain with lateral bending rotation extension and palpation of the lumbar facets reproducing severe discomfort. There was moderate tenderness over the PSIS and PII S region as well as the gluteal and piriformis musculature region. There was mild tenderness along the greater trochanteric region iliotibial band region and straight leg raise was tolerates approximately 30 without an increase of pain with dorsiflexion noted. Patrick's maneuver associated with mild to moderate discomfort. EHL strength appeared to be decreased. There was negative clonus negative Homans without a definite sensory deficit or dermatomal dystrophy detected. Abdomen was nontender and no costovertebral tenderness was noted.      Assessment & Plan:      Degenerative disc disease lumbar spine Small focal extraforaminal disc protrusion on the left at L4-5 potentially irritating the left L4 nerve root. Focal central disc protrusion at L5-S1 possibly irritating both S1 nerve roots  Lumbar radiculopathy  Lumbar facet syndrome  Sacroiliac joint dysfunction  Scoliosis  Fibromyalgia     PLAN   Continue present medications Opana and replaced Skelaxin with Zanaflex  Lumbar facet, medial branch nerves to be performed at time return appointment  F/U PCP  for evaliation  of  BP and general medical  condition  F/U surgical evaluation. May consider pending follow-up evaluations  F/U neurological evaluation. May consider PNCV/EMG studies and other studies pending follow-up evaluations  Ask the secretaries to inform you when insurance approves you for radiofrequency rhizolysis lumbar facet medial branch nerves  May consider radiofrequency rhizolysis or intraspinal procedures pending response to present  treatment  and F/U evaluation   Patient to call Pain Management Center should patient have concerns prior to scheduled return appointment

## 2015-10-04 ENCOUNTER — Encounter: Payer: Self-pay | Admitting: Pain Medicine

## 2015-10-04 ENCOUNTER — Ambulatory Visit: Payer: BLUE CROSS/BLUE SHIELD | Attending: Pain Medicine | Admitting: Pain Medicine

## 2015-10-04 VITALS — BP 119/93 | HR 110 | Temp 98.5°F | Resp 16 | Ht 62.0 in | Wt 124.0 lb

## 2015-10-04 DIAGNOSIS — M419 Scoliosis, unspecified: Secondary | ICD-10-CM | POA: Insufficient documentation

## 2015-10-04 DIAGNOSIS — R51 Headache: Secondary | ICD-10-CM | POA: Diagnosis not present

## 2015-10-04 DIAGNOSIS — M5127 Other intervertebral disc displacement, lumbosacral region: Secondary | ICD-10-CM | POA: Diagnosis not present

## 2015-10-04 DIAGNOSIS — M533 Sacrococcygeal disorders, not elsewhere classified: Secondary | ICD-10-CM | POA: Diagnosis not present

## 2015-10-04 DIAGNOSIS — M47816 Spondylosis without myelopathy or radiculopathy, lumbar region: Secondary | ICD-10-CM

## 2015-10-04 DIAGNOSIS — M5126 Other intervertebral disc displacement, lumbar region: Secondary | ICD-10-CM | POA: Diagnosis not present

## 2015-10-04 DIAGNOSIS — M797 Fibromyalgia: Secondary | ICD-10-CM | POA: Insufficient documentation

## 2015-10-04 DIAGNOSIS — M5116 Intervertebral disc disorders with radiculopathy, lumbar region: Secondary | ICD-10-CM | POA: Insufficient documentation

## 2015-10-04 DIAGNOSIS — M545 Low back pain: Secondary | ICD-10-CM | POA: Diagnosis present

## 2015-10-04 DIAGNOSIS — M5136 Other intervertebral disc degeneration, lumbar region: Secondary | ICD-10-CM

## 2015-10-04 DIAGNOSIS — M546 Pain in thoracic spine: Secondary | ICD-10-CM | POA: Diagnosis present

## 2015-10-04 DIAGNOSIS — M5416 Radiculopathy, lumbar region: Secondary | ICD-10-CM

## 2015-10-04 DIAGNOSIS — IMO0002 Reserved for concepts with insufficient information to code with codable children: Secondary | ICD-10-CM

## 2015-10-04 DIAGNOSIS — G629 Polyneuropathy, unspecified: Secondary | ICD-10-CM

## 2015-10-04 MED ORDER — OXYMORPHONE HCL 10 MG PO TABS: 10.0000 mg | ORAL_TABLET | Freq: Four times a day (QID) | ORAL | Status: DC

## 2015-10-04 MED ORDER — TIZANIDINE HCL 4 MG PO TABS: ORAL_TABLET | ORAL | Status: DC

## 2015-10-04 NOTE — Progress Notes (Signed)
Safety precautions to be maintained throughout the outpatient stay will include: orient to surroundings, keep bed in low position, maintain call bell within reach at all times, provide assistance with transfer out of bed and ambulation.  

## 2015-10-04 NOTE — Patient Instructions (Addendum)
PLAN   Continue present medications Opana and replaced Skelaxin with Zanaflex. Please discuss decreasing and discontinuing Klonopin  Lumbar facet, medial branch nerve, blocks to be performed at time return appointment  F/U PCP  for evaliation  of  BP and general medical  condition  F/U surgical evaluation. May consider pending follow-up evaluations  F/U Dr. Omelia Blackwater as planned and discuss decreasing and discontinuing Klonopin  F/U neurological evaluation. May consider PNCV/EMG studies and other studies pending follow-up evaluations  Ask the secretaries to inform you when insurance approves you for radiofrequency rhizolysis lumbar facet medial branch nerves as we previously discussed  May consider radiofrequency rhizolysis or intraspinal procedures pending response to present treatment and F/U evaluation   Patient to call [facility] should patient have concerns prior to scheduled return appointmentFacet Joint Block The facet joints connect the bones of the spine (vertebrae). They make it possible for you to bend, twist, and make other movements with your spine. They also prevent you from overbending, overtwisting, and making other excessive movements.  A facet joint block is a procedure where a numbing medicine (anesthetic) is injected into a facet joint. Often, a type of anti-inflammatory medicine called a steroid is also injected. A facet joint block may be done for two reasons:   Diagnosis. A facet joint block may be done as a test to see whether neck or back pain is caused by a worn-down or infected facet joint. If the pain gets better after a facet joint block, it means the pain is probably coming from the facet joint. If the pain does not get better, it means the pain is probably not coming from the facet joint.   Therapy. A facet joint block may be done to relieve neck or back pain caused by a facet joint. A facet joint block is only done as a therapy if the pain does not  improve with medicine, exercise programs, physical therapy, and other forms of pain management. LET [facility] CARE PROVIDER KNOW ABOUT:   Any allergies you have.   All medicines you are taking, including vitamins, herbs, eyedrops, and over-the-counter medicines and creams.   Previous problems you or members of your family have had with the use of anesthetics.   Any blood disorders you have had.   Other health problems you have. RISKS AND COMPLICATIONS Generally, having a facet joint block is safe. However, as with any procedure, complications can occur. Possible complications associated with having a facet joint block include:   Bleeding.   Injury to a nerve near the injection site.   Pain at the injection site.   Weakness or numbness in areas controlled by nerves near the injection site.   Infection.   Temporary fluid retention.   Allergic reaction to anesthetics or medicines used during the procedure. BEFORE THE PROCEDURE   Follow your health care provider's instructions if you are taking dietary supplements or medicines. You may need to stop taking them or reduce your dosage.   Do not take any new dietary supplements or medicines without asking your health care provider first.   Follow your health care provider's instructions about eating and drinking before the procedure. You may need to stop eating and drinking several hours before the procedure.   Arrange to have an adult drive you home after the procedure. PROCEDURE  You may need to remove your clothing and dress in an open-back gown so that your health care provider can access your spine.   The procedure  will be done while you are lying on an X-ray table. Most of the time you will be asked to lie on your stomach, but you may be asked to lie in a different position if an injection will be made in your neck.   Special machines will be used to monitor your oxygen levels, heart rate, and blood pressure.    If an injection will be made in your neck, an intravenous (IV) tube will be inserted into one of your veins. Fluids and medicine will flow directly into your body through the IV tube.   The area over the facet joint where the injection will be made will be cleaned with an antiseptic soap. The surrounding skin will be covered with sterile drapes.   An anesthetic will be applied to your skin to make the injection area numb. You may feel a temporary stinging or burning sensation.   A video X-ray machine will be used to locate the joint. A contrast dye may be injected into the facet joint area to help with locating the joint.   When the joint is located, an anesthetic medicine will be injected into the joint through the needle.   Your health care provider will ask you whether you feel pain relief. If you do feel relief, a steroid may be injected to provide pain relief for a longer period of time. If you do not feel relief or feel only partial relief, additional injections of an anesthetic may be made in other facet joints.   The needle will be removed, the skin will be cleansed, and bandages will be applied.  AFTER THE PROCEDURE   You will be observed for 15-30 minutes before being allowed to go home. Do not drive. Have an adult drive you or take a taxi or public transportation instead.   If you feel pain relief, the pain will return in several hours or days when the anesthetic wears off.   You may feel pain relief 2-14 days after the procedure. The amount of time this relief lasts varies from person to person.   It is normal to feel some tenderness over the injected area(s) for 2 days following the procedure.   If you have diabetes, you may have a temporary increase in blood sugar.   This information is not intended to replace advice given to you by your health care provider. Make sure you discuss any questions you have with your health care provider.   Document Released:  09/19/2006 Document Revised: 05/21/2014 Document Reviewed: 02/18/2012 Elsevier Interactive Patient Education 2016 [employer] GENERAL RISKS AND COMPLICATIONS  What are the risk, side effects and possible complications? Generally speaking, most procedures are safe.  However, with any procedure there are risks, side effects, and the possibility of complications.  The risks and complications are dependent upon the sites that are lesioned, or the type of nerve block to be performed.  The closer the procedure is to the spine, the more serious the risks are.  Great care is taken when placing the radio frequency needles, block needles or lesioning probes, but sometimes complications can occur.  Infection: Any time there is an injection through the skin, there is a risk of infection.  This is why sterile conditions are used for these blocks.  There are four possible types of infection.  Localized skin infection.  Central Nervous System Infection-This can be in the form of Meningitis, which can be deadly.  Epidural Infections-This can be in the form of an epidural  abscess, which can cause pressure inside of the spine, causing compression of the spinal cord with subsequent paralysis. This would require an emergency surgery to decompress, and there are no guarantees that the patient would recover from the paralysis.  Discitis-This is an infection of the intervertebral discs.  It occurs in about 1% of discography procedures.  It is difficult to treat and it may lead to surgery.        2. Pain: the needles have to go through skin and soft tissues, will cause soreness.       3. Damage to internal structures:  The nerves to be lesioned may be near blood vessels or    other nerves which can be potentially damaged.       4. Bleeding: Bleeding is more common if the patient is taking blood thinners such as  aspirin, Coumadin, Ticiid, Plavix, etc., or if he/she have some genetic predisposition  such as  hemophilia. Bleeding into the spinal canal can cause compression of the spinal  cord with subsequent paralysis.  This would require an emergency surgery to  decompress and there are no guarantees that the patient would recover from the  paralysis.       5. Pneumothorax:  Puncturing of a lung is a possibility, every time a needle is introduced in  the area of the chest or upper back.  Pneumothorax refers to free air around the  collapsed lung(s), inside of the thoracic cavity (chest cavity).  Another two possible  complications related to a similar event would include: Hemothorax and Chylothorax.   These are variations of the Pneumothorax, where instead of air around the collapsed  lung(s), you may have blood or chyle, respectively.       6. Spinal headaches: They may occur with any procedures in the area of the spine.       7. Persistent CSF (Cerebro-Spinal Fluid) leakage: This is a rare problem, but may occur  with prolonged intrathecal or epidural catheters either due to the formation of a fistulous  track or a dural tear.       8. Nerve damage: By working so close to the spinal cord, there is always a possibility of  nerve damage, which could be as serious as a permanent spinal cord injury with  paralysis.       9. Death:  Although rare, severe deadly allergic reactions known as "Anaphylactic  reaction" can occur to any of the medications used.      10. Worsening of the symptoms:  We can always make thing worse.  What are the chances of something like this happening? Chances of any of this occuring are extremely low.  By statistics, you have more of a chance of getting killed in a motor vehicle accident: while driving to the hospital than any of the above occurring .  Nevertheless, you should be aware that they are possibilities.  In general, it is similar to taking a shower.  Everybody knows that you can slip, hit your head and get killed.  Does that mean that you should not shower again?  Nevertheless  always keep in mind that statistics do not mean anything if you happen to be on the wrong side of them.  Even if a procedure has a 1 (one) in a 1,000,000 (million) chance of going wrong, it you happen to be that one..Also, keep in mind that by statistics, you have more of a chance of having something go wrong when taking medications.  Who should not have this procedure? If you are on a blood thinning medication (e.g. Coumadin, Plavix, see list of "Blood Thinners"), or if you have an active infection going on, you should not have the procedure.  If you are taking any blood thinners, please inform your physician.  How should I prepare for this procedure?  Do not eat or drink anything at least six hours prior to the procedure.  Bring a driver with you .  It cannot be a taxi.  Come accompanied by an adult that can drive you back, and that is strong enough to help you if your legs get weak or numb from the local anesthetic.  Take all of your medicines the morning of the procedure with just enough water to swallow them.  If you have diabetes, make sure that you are scheduled to have your procedure done first thing in the morning, whenever possible.  If you have diabetes, take only half of your insulin dose and notify our nurse that you have done so as soon as you arrive at the clinic.  If you are diabetic, but only take blood sugar pills (oral hypoglycemic), then do not take them on the morning of your procedure.  You may take them after you have had the procedure.  Do not take aspirin or any aspirin-containing medications, at least eleven (11) days prior to the procedure.  They may prolong bleeding.  Wear loose fitting clothing that may be easy to take off and that you would not mind if it got stained with Betadine or blood.  Do not wear any jewelry or perfume  Remove any nail coloring.  It will interfere with some of our monitoring equipment.  NOTE: Remember that this is not meant to be  interpreted as a complete list of all possible complications.  Unforeseen problems may occur.  BLOOD THINNERS The following drugs contain aspirin or other products, which can cause increased bleeding during surgery and should not be taken for 2 weeks prior to and 1 week after surgery.  If you should need take something for relief of minor pain, you may take acetaminophen which is found in Tylenol,m Datril, Anacin-3 and Panadol. It is not blood thinner. The products listed below are.  Do not take any of the products listed below in addition to any listed on your instruction sheet.  A.P.C or A.P.C with Codeine Codeine Phosphate Capsules #3 Ibuprofen Ridaura  ABC compound Congesprin Imuran rimadil  Advil Cope Indocin Robaxisal  Alka-Seltzer Effervescent Pain Reliever and Antacid Coricidin or Coricidin-D  Indomethacin Rufen  Alka-Seltzer plus Cold Medicine Cosprin Ketoprofen S-A-C Tablets  Anacin Analgesic Tablets or Capsules Coumadin Korlgesic Salflex  Anacin Extra Strength Analgesic tablets or capsules CP-2 Tablets Lanoril Salicylate  Anaprox Cuprimine Capsules Levenox Salocol  Anexsia-D Dalteparin Magan Salsalate  Anodynos Darvon compound Magnesium Salicylate Sine-off  Ansaid Dasin Capsules Magsal Sodium Salicylate  Anturane Depen Capsules Marnal Soma  APF Arthritis pain formula Dewitt's Pills Measurin Stanback  Argesic Dia-Gesic Meclofenamic Sulfinpyrazone  Arthritis Bayer Timed Release Aspirin Diclofenac Meclomen Sulindac  Arthritis pain formula Anacin Dicumarol Medipren Supac  Analgesic (Safety coated) Arthralgen Diffunasal Mefanamic Suprofen  Arthritis Strength Bufferin Dihydrocodeine Mepro Compound Suprol  Arthropan liquid Dopirydamole Methcarbomol with Aspirin Synalgos  ASA tablets/Enseals Disalcid Micrainin Tagament  Ascriptin Doan's Midol Talwin  Ascriptin A/D Dolene Mobidin Tanderil  Ascriptin Extra Strength Dolobid Moblgesic Ticlid  Ascriptin with Codeine Doloprin or Doloprin  with Codeine Momentum Tolectin  Asperbuf Duoprin Mono-gesic Trendar  Aspergum Duradyne  Motrin or Motrin IB Triminicin  Aspirin plain, buffered or enteric coated Durasal Myochrisine Trigesic  Aspirin Suppositories Easprin Nalfon Trillsate  Aspirin with Codeine Ecotrin Regular or Extra Strength Naprosyn Uracel  Atromid-S Efficin Naproxen Ursinus  Auranofin Capsules Elmiron Neocylate Vanquish  Axotal Emagrin Norgesic Verin  Azathioprine Empirin or Empirin with Codeine Normiflo Vitamin E  Azolid Emprazil Nuprin Voltaren  Bayer Aspirin plain, buffered or children's or timed BC Tablets or powders Encaprin Orgaran Warfarin Sodium  Buff-a-Comp Enoxaparin Orudis Zorpin  Buff-a-Comp with Codeine Equegesic Os-Cal-Gesic   Buffaprin Excedrin plain, buffered or Extra Strength Oxalid   Bufferin Arthritis Strength Feldene Oxphenbutazone   Bufferin plain or Extra Strength Feldene Capsules Oxycodone with Aspirin   Bufferin with Codeine Fenoprofen Fenoprofen Pabalate or Pabalate-SF   Buffets II Flogesic Panagesic   Buffinol plain or Extra Strength Florinal or Florinal with Codeine Panwarfarin   Buf-Tabs Flurbiprofen Penicillamine   Butalbital Compound Four-way cold tablets Penicillin   Butazolidin Fragmin Pepto-Bismol   Carbenicillin Geminisyn Percodan   Carna Arthritis Reliever Geopen Persantine   Carprofen Gold's salt Persistin   Chloramphenicol Goody's Phenylbutazone   Chloromycetin Haltrain Piroxlcam   Clmetidine heparin Plaquenil   Cllnoril Hyco-pap Ponstel   Clofibrate Hydroxy chloroquine Propoxyphen         Before stopping any of these medications, be sure to consult the physician who ordered them.  Some, such as Coumadin (Warfarin) are ordered to prevent or treat serious conditions such as "deep thrombosis", "pumonary embolisms", and other heart problems.  The amount of time that you may need off of the medication may also vary with the medication and the reason for which you were taking it.   If you are taking any of these medications, please make sure you notify your pain physician before you undergo any procedures.

## 2015-10-04 NOTE — Progress Notes (Signed)
Subjective:    Patient ID: Monique Castro, female    DOB: 12/23/1970, 45 y.o.   MRN: [id number]  HPI  The patient is a 45 year old female who returns to pain management for further evaluation and treatment of pain involving the upper mid lower back and lower extremity region as well as pain involving the neck associated with headaches. The patient's most bothersome pain occurs in the mid lower back region. 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 The caution patient regarding attempting such strenuous activities. At the present time patient has exacerbation of pain involving the mid and lower back region. We will continue medications as prescribed consisting of Opana immediate release and Zanaflex. Patient tolerating medications well without undesirable side effects. We will also consider patient for interventional treatment at time return appointment consisting of lumbar facet, medial branch nerve blocks and will also consider radiofrequency rhizolysis lumbar facets medial branch nerves pending insurance approval.. We will continue Opana immediate release and Zanaflex and will lumbar facet medial branch nerve blocks at time return appointment. All agreed to suggested treatment plan.  Review of Systems     Objective:   Physical Exam  There was tenderness of the splenius capitis and occipitalis regions of moderate degree with moderate tenderness over the cervical facet cervical paraspinal muscles regions as well as the thoracic facet thoracic paraspinal musculature region. Palpation of the acromioclavicular and glenohumeral joint regions reproduce moderate discomfort and patient appeared to be unremarkable Spurling's maneuver. There was moderate tenderness to palpation of muscle spasms involving thoracic paraspinal musculature region with no crepitus of the thoracic region noted. The patient appeared to be with bilaterally equal grip strength and Tinel  and Phalen's maneuver reproducing minimal discomfort. Palpation over the lumbar paraspinal must reason lumbar facet region reproduced moderately severe discomfort with lateral bending rotation extension and palpation of the lumbar facets reproducing moderate severe discomfort. Palpation over the PSIS and PII S region as well as the gluteal and piriformis muscles regions reproduce moderate discomfort. There was mild tenderness along the greater trochanteric region iliotibial band region. There was no definite sensory deficit or dermatomal distribution detected. The knees were without increased warmth and erythema and there was negative anterior and posterior drawer signs without ballottement of the patella. Negative clonus negative Homans. Abdomen nontender with no costovertebral maintenance noted. The predominant portion of patient's pain was reproduced with palpation over the lumbar facet lumbar paraspinal musculature region.      Assessment & Plan:     Degenerative disc disease lumbar spine Small focal extraforaminal disc protrusion on the left at L4-5 potentially irritating the left L4 nerve root. Focal central disc protrusion at L5-S1 possibly irritating both S1 nerve roots  Lumbar radiculopathy  Lumbar facet syndrome  Sacroiliac joint dysfunction  Scoliosis  Fibromyalgia       PLAN   Continue present medications Opana and replaced Skelaxin with Zanaflex. Please discuss decreasing and discontinuing Klonopin  Lumbar facet, medial branch nerve, blocks to be performed at time return appointment  F/U PCP  for evaliation  of  BP and general medical  condition  F/U surgical evaluation. May consider pending follow-up evaluations  F/U Dr. Omelia BlackwaterHeaden as planned and discuss decreasing and discontinuing Klonopin  F/U neurological evaluation. May consider PNCV/EMG studies and other studies pending follow-up evaluations  Ask the secretaries to inform you when insurance approves you for  radiofrequency rhizolysis lumbar facet medial branch nerves as we previously discussed  May  consider radiofrequency rhizolysis or intraspinal procedures pending response to present treatment and F/U evaluation   Patient to call [facility] Management Center should patient have concerns prior to scheduled return appointment

## 2015-10-13 ENCOUNTER — Telehealth: Payer: Self-pay | Admitting: *Deleted

## 2015-10-13 NOTE — Telephone Encounter (Signed)
lvm making the pt aware that I was returning her call to help her get r/s for her procedure. I asked the pt to please return my call...thanks

## 2015-10-13 NOTE — Telephone Encounter (Signed)
Pt called to cancel her procedure on 10/17/15 and to r/s. Gave an appt for 10/19/15@ 10:30am. Pt is aware...td

## 2015-10-17 ENCOUNTER — Ambulatory Visit: Payer: Self-pay | Admitting: Pain Medicine

## 2015-10-19 ENCOUNTER — Encounter: Payer: Self-pay | Admitting: Pain Medicine

## 2015-10-19 ENCOUNTER — Ambulatory Visit: Payer: BLUE CROSS/BLUE SHIELD | Attending: Pain Medicine | Admitting: Pain Medicine

## 2015-10-19 VITALS — BP 149/99 | HR 101 | Temp 98.2°F | Resp 16 | Ht 62.0 in | Wt 120.0 lb

## 2015-10-19 DIAGNOSIS — M79606 Pain in leg, unspecified: Secondary | ICD-10-CM | POA: Diagnosis present

## 2015-10-19 DIAGNOSIS — IMO0002 Reserved for concepts with insufficient information to code with codable children: Secondary | ICD-10-CM

## 2015-10-19 DIAGNOSIS — M5136 Other intervertebral disc degeneration, lumbar region: Secondary | ICD-10-CM | POA: Insufficient documentation

## 2015-10-19 DIAGNOSIS — M5126 Other intervertebral disc displacement, lumbar region: Secondary | ICD-10-CM | POA: Diagnosis not present

## 2015-10-19 DIAGNOSIS — M533 Sacrococcygeal disorders, not elsewhere classified: Secondary | ICD-10-CM

## 2015-10-19 DIAGNOSIS — M5416 Radiculopathy, lumbar region: Secondary | ICD-10-CM

## 2015-10-19 DIAGNOSIS — M47816 Spondylosis without myelopathy or radiculopathy, lumbar region: Secondary | ICD-10-CM

## 2015-10-19 DIAGNOSIS — G629 Polyneuropathy, unspecified: Secondary | ICD-10-CM

## 2015-10-19 DIAGNOSIS — M51369 Other intervertebral disc degeneration, lumbar region without mention of lumbar back pain or lower extremity pain: Secondary | ICD-10-CM

## 2015-10-19 DIAGNOSIS — M545 Low back pain: Secondary | ICD-10-CM | POA: Diagnosis present

## 2015-10-19 MED ORDER — TRIAMCINOLONE ACETONIDE 40 MG/ML IJ SUSP
40.0000 mg | Freq: Once | INTRAMUSCULAR | Status: AC
  Administered 2015-10-19: 40 mg

## 2015-10-19 MED ORDER — BUPIVACAINE HCL (PF) 0.25 % IJ SOLN
30.0000 mL | Freq: Once | INTRAMUSCULAR | Status: AC
Start: 2015-10-19 — End: 2015-10-19
  Administered 2015-10-19: 30 mL

## 2015-10-19 MED ORDER — FLUCONAZOLE 100 MG PO TABS: ORAL_TABLET | ORAL | Status: DC

## 2015-10-19 MED ORDER — MIDAZOLAM HCL 5 MG/5ML IJ SOLN: 5.0000 mg | Freq: Once | INTRAMUSCULAR | Status: DC

## 2015-10-19 MED ORDER — LACTATED RINGERS IV SOLN: 1000.0000 mL | INTRAVENOUS | Status: DC

## 2015-10-19 MED ORDER — FENTANYL CITRATE (PF) 100 MCG/2ML IJ SOLN
INTRAMUSCULAR | Status: AC
  Filled 2015-10-19: qty 2

## 2015-10-19 MED ORDER — CEFUROXIME AXETIL 250 MG PO TABS: 250.0000 mg | ORAL_TABLET | Freq: Two times a day (BID) | ORAL | Status: DC

## 2015-10-19 MED ORDER — TRIAMCINOLONE ACETONIDE 40 MG/ML IJ SUSP
INTRAMUSCULAR | Status: AC
  Administered 2015-10-19: 40 mg
  Filled 2015-10-19: qty 1

## 2015-10-19 MED ORDER — CEFAZOLIN SODIUM 1 G IJ SOLR
INTRAMUSCULAR | Status: AC
  Filled 2015-10-19: qty 10

## 2015-10-19 MED ORDER — ORPHENADRINE CITRATE 30 MG/ML IJ SOLN
INTRAMUSCULAR | Status: AC
  Filled 2015-10-19: qty 2

## 2015-10-19 MED ORDER — ORPHENADRINE CITRATE 30 MG/ML IJ SOLN: 60.0000 mg | Freq: Once | INTRAMUSCULAR | Status: DC

## 2015-10-19 MED ORDER — CEFAZOLIN SODIUM 1-5 GM-% IV SOLN
1.0000 g | Freq: Once | INTRAVENOUS | Status: DC
Start: 2015-10-19 — End: 2016-08-22

## 2015-10-19 MED ORDER — FENTANYL CITRATE (PF) 100 MCG/2ML IJ SOLN: 100.0000 ug | Freq: Once | INTRAMUSCULAR | Status: DC

## 2015-10-19 MED ORDER — BUPIVACAINE HCL (PF) 0.25 % IJ SOLN
INTRAMUSCULAR | Status: AC
  Administered 2015-10-19: 30 mL
  Filled 2015-10-19: qty 30

## 2015-10-19 NOTE — Patient Instructions (Addendum)
PLAN   Continue present medications Opana and Zanaflex at this time. NO SKELAXIN Please get Ceftin antibiotic and Diflucan today and begin taking Ceftin antibiotic and Diflucan today as prescribed  F/U PCP  Thies for evaliation of  BP and general medical condition as discussed  F/U surgical evaluation as discussed. Need to consider evaluation of hand by hand surgeon as we discussed as well  F/U neurological evaluation. May consider PNCV EMG studies and other studies   May consider radiofrequency rhizolysis or intraspinal procedures pending response to present treatment and F/U evaluation   Patient to call [facility] should patient have concerns prior to scheduled return appointmentPain Management Discharge Instructions  General Discharge Instructions :  If you need to reach your doctor call: Monday-Friday 8:00 am - 4:00 pm at [phone] or toll free [phone].  After clinic hours [phone] to have operator reach doctor.  Bring all of your medication bottles to all your appointments in the pain clinic.  To cancel or reschedule your appointment with Pain Management please remember to call 24 hours in advance to avoid a fee.  Refer to the educational materials which you have been given on: General Risks, I had my Procedure. Discharge Instructions, Post Sedation.  Post Procedure Instructions:  The drugs you were given will stay in your system until tomorrow, so for the next 24 hours you should not drive, make any legal decisions or drink any alcoholic beverages.  You may eat anything you prefer, but it is better to start with liquids then soups and crackers, and gradually work up to solid foods.  Please notify your doctor immediately if you have any unusual bleeding, trouble breathing or pain that is not related to your normal pain.  Depending on the type of procedure that was done, some parts of your body may feel week and/or numb.  This usually clears up by tonight  or the next day.  Walk with the use of an assistive device or accompanied by an adult for the 24 hours.  You may use ice on the affected area for the first 24 hours.  Put ice in a Ziploc bag and cover with a towel and place against area 15 minutes on 15 minutes off.  You may switch to heat after 24 hours.GENERAL RISKS AND COMPLICATIONS  What are the risk, side effects and possible complications? Generally speaking, most procedures are safe.  However, with any procedure there are risks, side effects, and the possibility of complications.  The risks and complications are dependent upon the sites that are lesioned, or the type of nerve block to be performed.  The closer the procedure is to the spine, the more serious the risks are.  Great care is taken when placing the radio frequency needles, block needles or lesioning probes, but sometimes complications can occur. 1. Infection: Any time there is an injection through the skin, there is a risk of infection.  This is why sterile conditions are used for these blocks.  There are four possible types of infection. 1. Localized skin infection. 2. Central Nervous System Infection-This can be in the form of Meningitis, which can be deadly. 3. Epidural Infections-This can be in the form of an epidural abscess, which can cause pressure inside of the spine, causing compression of the spinal cord with subsequent paralysis. This would require an emergency surgery to decompress, and there are no guarantees that the patient would recover from the paralysis. 4. Discitis-This is an infection of the intervertebral discs.  It occurs in about 1% of discography procedures.  It is difficult to treat and it may lead to surgery.        2. Pain: the needles have to go through skin and soft tissues, will cause soreness.       3. Damage to internal structures:  The nerves to be lesioned may be near blood vessels or    other nerves which can be potentially damaged.        4. Bleeding: Bleeding is more common if the patient is taking blood thinners such as  aspirin, Coumadin, Ticiid, Plavix, etc., or if he/she have some genetic predisposition  such as hemophilia. Bleeding into the spinal canal can cause compression of the spinal  cord with subsequent paralysis.  This would require an emergency surgery to  decompress and there are no guarantees that the patient would recover from the  paralysis.       5. Pneumothorax:  Puncturing of a lung is a possibility, every time a needle is introduced in  the area of the chest or upper back.  Pneumothorax refers to free air around the  collapsed lung(s), inside of the thoracic cavity (chest cavity).  Another two possible  complications related to a similar event would include: Hemothorax and Chylothorax.   These are variations of the Pneumothorax, where instead of air around the collapsed  lung(s), you may have blood or chyle, respectively.       6. Spinal headaches: They may occur with any procedures in the area of the spine.       7. Persistent CSF (Cerebro-Spinal Fluid) leakage: This is a rare problem, but may occur  with prolonged intrathecal or epidural catheters either due to the formation of a fistulous  track or a dural tear.       8. Nerve damage: By working so close to the spinal cord, there is always a possibility of  nerve damage, which could be as serious as a permanent spinal cord injury with  paralysis.       9. Death:  Although rare, severe deadly allergic reactions known as "Anaphylactic  reaction" can occur to any of the medications used.      10. Worsening of the symptoms:  We can always make thing worse.  What are the chances of something like this happening? Chances of any of this occuring are extremely low.  By statistics, you have more of a chance of getting killed in a motor vehicle accident: while driving to the hospital than any of the above occurring .  Nevertheless, you should be aware that they are  possibilities.  In general, it is similar to taking a shower.  Everybody knows that you can slip, hit your head and get killed.  Does that mean that you should not shower again?  Nevertheless always keep in mind that statistics do not mean anything if you happen to be on the wrong side of them.  Even if a procedure has a 1 (one) in a 1,000,000 (million) chance of going wrong, it you happen to be that one..Also, keep in mind that by statistics, you have more of a chance of having something go wrong when taking medications.  Who should not have this procedure? If you are on a blood thinning medication (e.g. Coumadin, Plavix, see list of "Blood Thinners"), or if you have an active infection going on, you should not have the procedure.  If you are taking any blood thinners, please inform your physician.  How should I prepare for this procedure?  Do not eat or drink anything at least six hours prior to the procedure.  Bring a driver with you .  It cannot be a taxi.  Come accompanied by an adult that can drive you back, and that is strong enough to help you if your legs get weak or numb from the local anesthetic.  Take all of your medicines the morning of the procedure with just enough water to swallow them.  If you have diabetes, make sure that you are scheduled to have your procedure done first thing in the morning, whenever possible.  If you have diabetes, take only half of your insulin dose and notify our nurse that you have done so as soon as you arrive at the clinic.  If you are diabetic, but only take blood sugar pills (oral hypoglycemic), then do not take them on the morning of your procedure.  You may take them after you have had the procedure.  Do not take aspirin or any aspirin-containing medications, at least eleven (11) days prior to the procedure.  They may prolong bleeding.  Wear loose fitting clothing that may be easy to take off and that you would not mind if it got stained with  Betadine or blood.  Do not wear any jewelry or perfume  Remove any nail coloring.  It will interfere with some of our monitoring equipment.  NOTE: Remember that this is not meant to be interpreted as a complete list of all possible complications.  Unforeseen problems may occur.  BLOOD THINNERS The following drugs contain aspirin or other products, which can cause increased bleeding during surgery and should not be taken for 2 weeks prior to and 1 week after surgery.  If you should need take something for relief of minor pain, you may take acetaminophen which is found in Tylenol,m Datril, Anacin-3 and Panadol. It is not blood thinner. The products listed below are.  Do not take any of the products listed below in addition to any listed on your instruction sheet.  A.P.C or A.P.C with Codeine Codeine Phosphate Capsules #3 Ibuprofen Ridaura  ABC compound Congesprin Imuran rimadil  Advil Cope Indocin Robaxisal  Alka-Seltzer Effervescent Pain Reliever and Antacid Coricidin or Coricidin-D  Indomethacin Rufen  Alka-Seltzer plus Cold Medicine Cosprin Ketoprofen S-A-C Tablets  Anacin Analgesic Tablets or Capsules Coumadin Korlgesic Salflex  Anacin Extra Strength Analgesic tablets or capsules CP-2 Tablets Lanoril Salicylate  Anaprox Cuprimine Capsules Levenox Salocol  Anexsia-D Dalteparin Magan Salsalate  Anodynos Darvon compound Magnesium Salicylate Sine-off  Ansaid Dasin Capsules Magsal Sodium Salicylate  Anturane Depen Capsules Marnal Soma  APF Arthritis pain formula Dewitt's Pills Measurin Stanback  Argesic Dia-Gesic Meclofenamic Sulfinpyrazone  Arthritis Bayer Timed Release Aspirin Diclofenac Meclomen Sulindac  Arthritis pain formula Anacin Dicumarol Medipren Supac  Analgesic (Safety coated) Arthralgen Diffunasal Mefanamic Suprofen  Arthritis Strength Bufferin Dihydrocodeine Mepro Compound Suprol  Arthropan liquid Dopirydamole Methcarbomol with Aspirin Synalgos  ASA tablets/Enseals Disalcid  Micrainin Tagament  Ascriptin Doan's Midol Talwin  Ascriptin A/D Dolene Mobidin Tanderil  Ascriptin Extra Strength Dolobid Moblgesic Ticlid  Ascriptin with Codeine Doloprin or Doloprin with Codeine Momentum Tolectin  Asperbuf Duoprin Mono-gesic Trendar  Aspergum Duradyne Motrin or Motrin IB Triminicin  Aspirin plain, buffered or enteric coated Durasal Myochrisine Trigesic  Aspirin Suppositories Easprin Nalfon Trillsate  Aspirin with Codeine Ecotrin Regular or Extra Strength Naprosyn Uracel  Atromid-S Efficin Naproxen Ursinus  Auranofin Capsules Elmiron Neocylate Vanquish  Axotal Emagrin Norgesic Verin  Azathioprine  Empirin or Empirin with Codeine Normiflo Vitamin E  Azolid Emprazil Nuprin Voltaren  Bayer Aspirin plain, buffered or children's or timed BC Tablets or powders Encaprin Orgaran Warfarin Sodium  Buff-a-Comp Enoxaparin Orudis Zorpin  Buff-a-Comp with Codeine Equegesic Os-Cal-Gesic   Buffaprin Excedrin plain, buffered or Extra Strength Oxalid   Bufferin Arthritis Strength Feldene Oxphenbutazone   Bufferin plain or Extra Strength Feldene Capsules Oxycodone with Aspirin   Bufferin with Codeine Fenoprofen Fenoprofen Pabalate or Pabalate-SF   Buffets II Flogesic Panagesic   Buffinol plain or Extra Strength Florinal or Florinal with Codeine Panwarfarin   Buf-Tabs Flurbiprofen Penicillamine   Butalbital Compound Four-way cold tablets Penicillin   Butazolidin Fragmin Pepto-Bismol   Carbenicillin Geminisyn Percodan   Carna Arthritis Reliever Geopen Persantine   Carprofen Gold's salt Persistin   Chloramphenicol Goody's Phenylbutazone   Chloromycetin Haltrain Piroxlcam   Clmetidine heparin Plaquenil   Cllnoril Hyco-pap Ponstel   Clofibrate Hydroxy chloroquine Propoxyphen         Before stopping any of these medications, be sure to consult the physician who ordered them.  Some, such as Coumadin (Warfarin) are ordered to prevent or treat serious conditions such as "deep thrombosis",  "pumonary embolisms", and other heart problems.  The amount of time that you may need off of the medication may also vary with the medication and the reason for which you were taking it.  If you are taking any of these medications, please make sure you notify your pain physician before you undergo any procedures.

## 2015-10-19 NOTE — Progress Notes (Signed)
Subjective:    Patient ID: Monique Castro, female    DOB: 03-05-1971, 45 y.o.   MRN: [id number]  HPI  PROCEDURE PERFORMED: Lumbar facet (medial branch block)   NOTE: The patient is a 45 y.o. female who returns to [facility] Center for further evaluation and treatment of pain involving the lumbar and lower extremity region. MRI  revealed the patient to be with evidence of degenerative disc disease lumbar spine Small focal extraforaminal disc protrusion on the left at L4-5 potentially irritating the left L4 nerve root. Focal central disc protrusion at L5-S1 possibly irritating both S1 nerve roots. The patient appears to be with significant component of pain due to lumbar facet syndrome with palpation over the lumbar facet lumbar paraspinal musculature region reproduced in severely disabling pain on the left as well as on the right side The risks, benefits, and expectations of the procedure have been discussed and explained to the patient who was understanding and in agreement with suggested treatment plan. We will proceed with interventional treatment as discussed and as explained to the patient who was understanding and wished to proceed with procedure as planned.   DESCRIPTION OF PROCEDURE: Lumbar facet (medial branch block) with EKG, blood pressure, pulse, capnography, and pulse oximetry monitoring. The procedure was performed with the patient in the prone position. Betadine prep of proposed entry site performed.   NEEDLE PLACEMENT AT: Left L 2 lumbar facet (medial branch block). Under fluoroscopic guidance with oblique orientation of 15 degrees, a 22-gauge needle was inserted at the L 2 vertebral body level with needle placed at the targeted area of Burton's Eye or Eye of the Scotty Dog with documentation of needle placement in the superior and lateral border of targeted area of Burton's Eye or Eye of the Scotty Dog with oblique orientation of 15 degrees. Following documentation of needle  placement at the L 2 vertebral body level, needle placement was then accomplished at the L 3 vertebral body level.   NEEDLE PLACEMENT AT L3, L4, and L5 VERTEBRAL BODY LEVELS ON THE LEFT SIDE The procedure was performed at the L3, L4, and L5 vertebral body levels exactly as was performed at the L 2 vertebral body level utilizing the same technique and under fluoroscopic guidance.  NEEDLE PLACEMENT AT THE SACRAL ALA with AP view of the lumbosacral spine. With the patient in the prone position, Betadine prep of proposed entry site accomplished, a 22 gauge needle was inserted in the region of the sacral ala (groove formed by the superior articulating process of S1 and the sacral wing). Following documentation of needle placement at the sacral ala,   Needle placement was then verified at all levels on lateral view. Following documentation of needle placement at all levels on lateral view and following negative aspiration for heme and CSF, each level was injected with 1 mL of 0.25% bupivacaine with Kenalog.     LUMBAR FACET, MEDIAL BRANCH NERVE, BLOCKS PERFORMED ON THE RIGHT SIDE   The procedure was performed on the right side exactly as was performed on the left side at the same levels and utilizing the same technique under fluoroscopic guidance.   Myoneural block injections of the thoracic paraspinal musculature region Following Betadine prep of proposed entry site a 22-gauge needle was inserted into the thoracic musculature region and following negative aspiration 1 cc of 0.25% bupivacaine was injected into the thoracic paraspinal musculature region times two.     The patient tolerated the procedure well. A total of 40  mg of Kenalog was utilized for the procedure.   PLAN:  1. Medications: The patient will continue presently prescribed medications. 2. May consider modification of treatment regimen at time of return appointment pending response to treatment rendered on today's visit. 3. The  patient is to follow-up with primary care physician Dr.Thies  for further evaluation of blood pressure and general medical condition status post steroid injection performed on today's visit. 4. Surgical follow-up evaluation as discussed 5. Neurological follow-up evaluation. Has been addressed 6. The patient may be candidate for radiofrequency procedures, implantation type procedures, and other treatment pending response to treatment and follow-up evaluation. 7. The patient has been advised to call the Pain Management Center prior to scheduled return appointment should there be significant change in condition or should patient have other concerns regarding condition prior to scheduled return appointment.  The patient is understanding and in agreement with suggested treatment plan.   Review of Systems     Objective:   Physical Exam        Assessment & Plan:

## 2015-10-19 NOTE — Progress Notes (Signed)
Patient here for procedure d/t lower back pain. Safety precautions to be maintained throughout the outpatient stay will include: orient to surroundings, keep bed in low position, maintain call bell within reach at all times, provide assistance with transfer out of bed and ambulation.  

## 2015-10-20 ENCOUNTER — Telehealth: Payer: Self-pay | Admitting: *Deleted

## 2015-10-20 NOTE — Telephone Encounter (Signed)
Left voicemail for patient to call our office if there are questions or concerns re; procedure on yesterday.  

## 2015-10-25 LAB — HM PAP SMEAR: HM PAP: POSITIVE

## 2015-10-29 LAB — HM MAMMOGRAPHY: HM Mammogram: NORMAL (ref 0–4)

## 2015-11-01 ENCOUNTER — Ambulatory Visit: Payer: BLUE CROSS/BLUE SHIELD | Attending: Pain Medicine | Admitting: Pain Medicine

## 2015-11-01 ENCOUNTER — Encounter: Payer: Self-pay | Admitting: Pain Medicine

## 2015-11-01 VITALS — BP 123/56 | HR 109 | Temp 97.7°F | Resp 18 | Ht 62.0 in | Wt 119.0 lb

## 2015-11-01 DIAGNOSIS — M5126 Other intervertebral disc displacement, lumbar region: Secondary | ICD-10-CM | POA: Diagnosis not present

## 2015-11-01 DIAGNOSIS — M5116 Intervertebral disc disorders with radiculopathy, lumbar region: Secondary | ICD-10-CM | POA: Insufficient documentation

## 2015-11-01 DIAGNOSIS — M5136 Other intervertebral disc degeneration, lumbar region: Secondary | ICD-10-CM

## 2015-11-01 DIAGNOSIS — M419 Scoliosis, unspecified: Secondary | ICD-10-CM | POA: Insufficient documentation

## 2015-11-01 DIAGNOSIS — M79605 Pain in left leg: Secondary | ICD-10-CM | POA: Diagnosis present

## 2015-11-01 DIAGNOSIS — M545 Low back pain: Secondary | ICD-10-CM | POA: Diagnosis present

## 2015-11-01 DIAGNOSIS — M797 Fibromyalgia: Secondary | ICD-10-CM | POA: Diagnosis not present

## 2015-11-01 DIAGNOSIS — M533 Sacrococcygeal disorders, not elsewhere classified: Secondary | ICD-10-CM | POA: Insufficient documentation

## 2015-11-01 DIAGNOSIS — IMO0002 Reserved for concepts with insufficient information to code with codable children: Secondary | ICD-10-CM

## 2015-11-01 DIAGNOSIS — G629 Polyneuropathy, unspecified: Secondary | ICD-10-CM

## 2015-11-01 DIAGNOSIS — M47816 Spondylosis without myelopathy or radiculopathy, lumbar region: Secondary | ICD-10-CM

## 2015-11-01 DIAGNOSIS — M79604 Pain in right leg: Secondary | ICD-10-CM | POA: Diagnosis present

## 2015-11-01 DIAGNOSIS — M5416 Radiculopathy, lumbar region: Secondary | ICD-10-CM

## 2015-11-01 LAB — HM DEXA SCAN

## 2015-11-01 MED ORDER — TIZANIDINE HCL 4 MG PO TABS: ORAL_TABLET | ORAL | Status: DC

## 2015-11-01 MED ORDER — OXYMORPHONE HCL 10 MG PO TABS: ORAL_TABLET | ORAL | Status: DC

## 2015-11-01 NOTE — Patient Instructions (Addendum)
PLAN   Continue present medications Opana and Zanaflex. Please discuss decreasing and discontinuing Klonopin  F/U PCP  for evaliation  of  BP and general medical  condition  F/U surgical evaluation. May consider pending follow-up evaluations  F/U Dr. Omelia BlackwaterHeaden as planned and discuss decreasing and discontinuing Klonopin  F/U neurological evaluation. May consider PNCV/EMG studies and other studies pending follow-up evaluations  Ask the secretaries to inform you when insurance approves you for radiofrequency rhizolysis lumbar facet medial branch nerves on the right side as we previously discussed  Patient to call Pain Management Center should patient have concerns prior to scheduled return appointment

## 2015-11-01 NOTE — Progress Notes (Signed)
Safety precautions to be maintained throughout the outpatient stay will include: orient to surroundings, keep bed in low position, maintain call bell within reach at all times, provide assistance with transfer out of bed and ambulation.  

## 2015-11-01 NOTE — Progress Notes (Signed)
Subjective:    Patient ID: Monique Castro, female    DOB: 1970-10-08, 45 y.o.   MRN: [id number]  HPI  The patient is a 45 year old female who returns to pain management for further evaluation and treatment of pain involving the mid lower back and lower extremity regions with some pain involving the upper extremities of lesser degree. The patient states that she has significant improvement of pain following previous lumbar facet, medial branch nerve, blocks. The patient continues Opana and Zanaflex without undesirable side effects. The patient states that she has had improvement of her pain with the present medications and with prior lumbar facet, medial branch nerve blocks. We discussed patient's condition and at the present time we are requesting insurance approval for radiofrequency rhizolysis lumbar facet, medial branch nerves. The patient is with greater than 70% relief of pain following the lumbar facet, medial branch nerve blocks. We will continue Zanaflex and oxymorphone at this time and we will remain available to consider additional modifications of treatment pending response to treatment and follow-up evaluation. All agreed to suggested treatment plan.  Review of Systems     Objective:   Physical Exam  There was mild to moderate tenderness of the splenius capitis and occipitalis region. Palpation of the cervical facet cervical paraspinal musculature he can reproduce mild discomfort. Palpation of the acromioclavicular and glenohumeral joint regions reproduce mild discomfort. The patient appeared to be with slightly decreased grip strength on the right compared to the left. Tinel and Phalen's maneuver were without increase of pain of significant degree. Palpation of the medial and lateral epicondyles of the right elbow reproduced mild to moderate discomfort. No increased warmth or erythema was noted in the region of the elbow. Palpation over the thoracic region thoracic facet region was  attends to palpation of moderate degree in the lower thoracic paraspinal musculature region with no crepitus of the thoracic region noted. The patient appeared to be with unremarkable Spurling's maneuver. Palpation of the lumbar paraspinal musculatures and lumbar facet region was attends to palpation of moderate degree. Lateral bending rotation extension and palpation of the lumbar facets reproduce moderate discomfort. Straight leg raising was tolerates approximately 30 without increased pain with dorsiflexion noted. EHL strength appeared to be slightly decreased. No definite sensory deficit of dermatomal distribution was detected. EHL strength appeared to be slightly decreased. There was tenderness to palpation over the PSIS and PII S regions a moderate degree. There was mild tenderness of the greater trochanteric region iliotibial band region. Abdomen nontender with no costovertebral tenderness noted. Lateral bending rotation extension and palpation of the lumbar facets reproduce predominant portion of patient's pain      Assessment & Plan:    Degenerative disc disease lumbar spine Small focal extraforaminal disc protrusion on the left at L4-5 potentially irritating the left L4 nerve root. Focal central disc protrusion at L5-S1 possibly irritating both S1 nerve roots  Lumbar radiculopathy  Lumbar facet syndrome  Sacroiliac joint dysfunction  Scoliosis  Fibromyalgia     PLAN   Continue present medications Opana and Zanaflex. Please discuss decreasing and discontinuing Klonopin with Dr. Omelia Blackwater   F/U PCP  for evaliation  of  BP and general medical  condition  F/U surgical evaluation. May consider pending follow-up evaluations  F/U Dr. Omelia Blackwater as planned and discuss decreasing and discontinuing Klonopin  F/U neurological evaluation. May consider PNCV/EMG studies and other studies pending follow-up evaluations  Ask the secretaries to inform you when insurance approves you for  radiofrequency rhizolysis  lumbar facet medial branch nerves on the right side as we previously discussed  Patient to call Pain Management Center should patient have concerns prior to scheduled return appointment

## 2015-11-29 ENCOUNTER — Encounter: Payer: Self-pay | Admitting: Emergency Medicine

## 2015-11-29 ENCOUNTER — Emergency Department: Payer: BLUE CROSS/BLUE SHIELD

## 2015-11-29 ENCOUNTER — Emergency Department
Admission: EM | Admit: 2015-11-29 | Discharge: 2015-11-29 | Disposition: A | Discharge status: Home / Self Care | Payer: BLUE CROSS/BLUE SHIELD | Attending: Emergency Medicine | Admitting: Emergency Medicine

## 2015-11-29 DIAGNOSIS — Z7951 Long term (current) use of inhaled steroids: Secondary | ICD-10-CM | POA: Diagnosis not present

## 2015-11-29 DIAGNOSIS — Y939 Activity, unspecified: Secondary | ICD-10-CM | POA: Insufficient documentation

## 2015-11-29 DIAGNOSIS — M1990 Unspecified osteoarthritis, unspecified site: Secondary | ICD-10-CM | POA: Insufficient documentation

## 2015-11-29 DIAGNOSIS — S060X0A Concussion without loss of consciousness, initial encounter: Secondary | ICD-10-CM | POA: Diagnosis not present

## 2015-11-29 DIAGNOSIS — F17210 Nicotine dependence, cigarettes, uncomplicated: Secondary | ICD-10-CM | POA: Diagnosis not present

## 2015-11-29 DIAGNOSIS — Z792 Long term (current) use of antibiotics: Secondary | ICD-10-CM | POA: Diagnosis not present

## 2015-11-29 DIAGNOSIS — Y929 Unspecified place or not applicable: Secondary | ICD-10-CM | POA: Insufficient documentation

## 2015-11-29 DIAGNOSIS — M5136 Other intervertebral disc degeneration, lumbar region: Secondary | ICD-10-CM | POA: Insufficient documentation

## 2015-11-29 DIAGNOSIS — Y999 Unspecified external cause status: Secondary | ICD-10-CM | POA: Insufficient documentation

## 2015-11-29 DIAGNOSIS — Z79899 Other long term (current) drug therapy: Secondary | ICD-10-CM | POA: Diagnosis not present

## 2015-11-29 DIAGNOSIS — F329 Major depressive disorder, single episode, unspecified: Secondary | ICD-10-CM | POA: Diagnosis not present

## 2015-11-29 DIAGNOSIS — S0990XA Unspecified injury of head, initial encounter: Secondary | ICD-10-CM | POA: Diagnosis present

## 2015-11-29 NOTE — Discharge Instructions (Signed)
Please seek medical attention for any high fevers, chest pain, shortness of breath, change in behavior, persistent vomiting, bloody stool or any other new or concerning symptoms. ° ° °Post-Concussion Syndrome °Post-concussion syndrome describes the symptoms that can occur after a head injury. These symptoms can last from weeks to months. °CAUSES  °It is not clear why some head injuries cause post-concussion syndrome. It can occur whether your head injury was mild or severe and whether you were wearing head protection or not.  °SIGNS AND SYMPTOMS °· Memory difficulties. °· Dizziness. °· Headaches. °· Double vision or blurry vision. °· Sensitivity to light. °· Hearing difficulties. °· Depression. °· Tiredness. °· Weakness. °· Difficulty with concentration. °· Difficulty sleeping or staying asleep. °· Vomiting. °· Poor balance or instability on your feet. °· Slow reaction time. °· Difficulty learning and remembering things you have heard. °DIAGNOSIS  °There is no test to determine whether you have post-concussion syndrome. Your health care provider may order an imaging scan of your brain, such as a CT scan, to check for other problems that may be causing your symptoms (such as a severe injury inside your skull). °TREATMENT  °Usually, these problems disappear over time without medical care. Your health care provider may prescribe medicine to help ease your symptoms. It is important to follow up with a neurologist to evaluate your recovery and address any lingering symptoms or issues. °HOME CARE INSTRUCTIONS  °· Take medicines only as directed by your health care provider. Do not take aspirin. Aspirin can slow blood clotting. °· Sleep with your head slightly elevated to help with headaches. °· Avoid any situation where there is potential for another head injury. This includes football, hockey, soccer, basketball, martial arts, downhill snow sports, and horseback riding. Your condition will get worse every time you  experience a concussion. You should avoid these activities until you are evaluated by the appropriate follow-up health care providers. °· Keep all follow-up visits as directed by your health care provider. This is important. °SEEK MEDICAL CARE IF: °· You have increased problems paying attention or concentrating. °· You have increased difficulty remembering or learning new information. °· You need more time to complete tasks or assignments than before. °· You have increased irritability or decreased ability to cope with stress. °· You have more symptoms than before. °Seek medical care if you have any of the following symptoms for more than two weeks after your injury: °· Lasting (chronic) headaches. °· Dizziness or balance problems. °· Nausea. °· Vision problems. °· Increased sensitivity to noise or light. °· Depression or mood swings. °· Anxiety or irritability. °· Memory problems. °· Difficulty concentrating or paying attention. °· Sleep problems. °· Feeling tired all the time. °SEEK IMMEDIATE MEDICAL CARE IF: °· You have confusion or unusual drowsiness. °· Others find it difficult to wake you up. °· You have nausea or persistent, forceful vomiting. °· You feel like you are moving when you are not (vertigo). Your eyes may move rapidly back and forth. °· You have convulsions or faint. °· You have severe, persistent headaches that are not relieved by medicine. °· You cannot use your arms or legs normally. °· One of your pupils is larger than the other. °· You have clear or bloody discharge from your nose or ears. °· Your problems are getting worse, not better. °MAKE SURE YOU: °· Understand these instructions. °· Will watch your condition. °· Will get help right away if you are not doing well or get worse. °  °This   information is not intended to replace advice given to you by your health care provider. Make sure you discuss any questions you have with your health care provider. °  °Document Released: 10/20/2001  Document Revised: 05/21/2014 Document Reviewed: 08/05/2013 °Elsevier Interactive Patient Education ©2016 [employer] ° °
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
## 2015-11-30 ENCOUNTER — Ambulatory Visit: Payer: BLUE CROSS/BLUE SHIELD | Attending: Pain Medicine | Admitting: Pain Medicine

## 2015-11-30 ENCOUNTER — Encounter: Payer: Self-pay | Admitting: Pain Medicine

## 2015-11-30 VITALS — BP 122/98 | HR 111 | Temp 98.8°F | Resp 16 | Ht 62.0 in | Wt 120.0 lb

## 2015-11-30 DIAGNOSIS — M47816 Spondylosis without myelopathy or radiculopathy, lumbar region: Secondary | ICD-10-CM

## 2015-11-30 DIAGNOSIS — M5416 Radiculopathy, lumbar region: Secondary | ICD-10-CM

## 2015-11-30 DIAGNOSIS — G629 Polyneuropathy, unspecified: Secondary | ICD-10-CM

## 2015-11-30 DIAGNOSIS — IMO0002 Reserved for concepts with insufficient information to code with codable children: Secondary | ICD-10-CM

## 2015-11-30 DIAGNOSIS — M533 Sacrococcygeal disorders, not elsewhere classified: Secondary | ICD-10-CM

## 2015-11-30 DIAGNOSIS — M5136 Other intervertebral disc degeneration, lumbar region: Secondary | ICD-10-CM

## 2015-11-30 MED ORDER — TIZANIDINE HCL 4 MG PO TABS: ORAL_TABLET | ORAL | Status: DC

## 2015-11-30 MED ORDER — OXYMORPHONE HCL 10 MG PO TABS: ORAL_TABLET | ORAL | Status: DC

## 2015-11-30 NOTE — Progress Notes (Signed)
Safety precautions to be maintained throughout the outpatient stay will include: orient to surroundings, keep bed in low position, maintain call bell within reach at all times, provide assistance with transfer out of bed and ambulation.  
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
## 2015-11-30 NOTE — Patient Instructions (Addendum)
PLAN   Continue present medications Opana and Zanaflex. Please discuss decreasing and discontinuing Klonopin  Lumbar facet, medial branch nerve blocks to be performed at time return appointment  F/U PCP  Dr. Selena Batten  for evaliation  of  BP and general medical  condition  F/U surgical evaluation. May consider pending follow-up evaluations  F/U Dr. Omelia Blackwater as planned and discuss decreasing and discontinuing Klonopin  F/U neurological evaluation. May consider PNCV/EMG studies and other studies pending follow-up evaluations  Ask the secretaries to inform you when insurance approves you for radiofrequency rhizolysis lumbar facet medial branch nerves on the right side as we previously discussed  Patient to call [facility] should patient have concerns prior to scheduled return appointmentFacet Joint Block The facet joints connect the bones of the spine (vertebrae). They make it possible for you to bend, twist, and make other movements with your spine. They also prevent you from overbending, overtwisting, and making other excessive movements.  A facet joint block is a procedure where a numbing medicine (anesthetic) is injected into a facet joint. Often, a type of anti-inflammatory medicine called a steroid is also injected. A facet joint block may be done for two reasons:   Diagnosis. A facet joint block may be done as a test to see whether neck or back pain is caused by a worn-down or infected facet joint. If the pain gets better after a facet joint block, it means the pain is probably coming from the facet joint. If the pain does not get better, it means the pain is probably not coming from the facet joint.   Therapy. A facet joint block may be done to relieve neck or back pain caused by a facet joint. A facet joint block is only done as a therapy if the pain does not improve with medicine, exercise programs, physical therapy, and other forms of pain management. LET [facility] CARE  PROVIDER KNOW ABOUT:   Any allergies you have.   All medicines you are taking, including vitamins, herbs, eyedrops, and over-the-counter medicines and creams.   Previous problems you or members of your family have had with the use of anesthetics.   Any blood disorders you have had.   Other health problems you have. RISKS AND COMPLICATIONS Generally, having a facet joint block is safe. However, as with any procedure, complications can occur. Possible complications associated with having a facet joint block include:   Bleeding.   Injury to a nerve near the injection site.   Pain at the injection site.   Weakness or numbness in areas controlled by nerves near the injection site.   Infection.   Temporary fluid retention.   Allergic reaction to anesthetics or medicines used during the procedure. BEFORE THE PROCEDURE   Follow your health care provider's instructions if you are taking dietary supplements or medicines. You may need to stop taking them or reduce your dosage.   Do not take any new dietary supplements or medicines without asking your health care provider first.   Follow your health care provider's instructions about eating and drinking before the procedure. You may need to stop eating and drinking several hours before the procedure.   Arrange to have an adult drive you home after the procedure. PROCEDURE  You may need to remove your clothing and dress in an open-back gown so that your health care provider can access your spine.   The procedure will be done while you are lying on an X-ray table. Most of  the time you will be asked to lie on your stomach, but you may be asked to lie in a different position if an injection will be made in your neck.   Special machines will be used to monitor your oxygen levels, heart rate, and blood pressure.   If an injection will be made in your neck, an intravenous (IV) tube will be inserted into one of your veins.  Fluids and medicine will flow directly into your body through the IV tube.   The area over the facet joint where the injection will be made will be cleaned with an antiseptic soap. The surrounding skin will be covered with sterile drapes.   An anesthetic will be applied to your skin to make the injection area numb. You may feel a temporary stinging or burning sensation.   A video X-ray machine will be used to locate the joint. A contrast dye may be injected into the facet joint area to help with locating the joint.   When the joint is located, an anesthetic medicine will be injected into the joint through the needle.   Your health care provider will ask you whether you feel pain relief. If you do feel relief, a steroid may be injected to provide pain relief for a longer period of time. If you do not feel relief or feel only partial relief, additional injections of an anesthetic may be made in other facet joints.   The needle will be removed, the skin will be cleansed, and bandages will be applied.  AFTER THE PROCEDURE   You will be observed for 15-30 minutes before being allowed to go home. Do not drive. Have an adult drive you or take a taxi or public transportation instead.   If you feel pain relief, the pain will return in several hours or days when the anesthetic wears off.   You may feel pain relief 2-14 days after the procedure. The amount of time this relief lasts varies from person to person.   It is normal to feel some tenderness over the injected area(s) for 2 days following the procedure.   If you have diabetes, you may have a temporary increase in blood sugar.   This information is not intended to replace advice given to you by your health care provider. Make sure you discuss any questions you have with your health care provider.   Document Released: 09/19/2006 Document Revised: 05/21/2014 Document Reviewed: 02/18/2012 Elsevier Interactive Patient Education 2016  [employer] GENERAL RISKS AND COMPLICATIONS  What are the risk, side effects and possible complications? Generally speaking, most procedures are safe.  However, with any procedure there are risks, side effects, and the possibility of complications.  The risks and complications are dependent upon the sites that are lesioned, or the type of nerve block to be performed.  The closer the procedure is to the spine, the more serious the risks are.  Great care is taken when placing the radio frequency needles, block needles or lesioning probes, but sometimes complications can occur.  Infection: Any time there is an injection through the skin, there is a risk of infection.  This is why sterile conditions are used for these blocks.  There are four possible types of infection.  Localized skin infection.  Central Nervous System Infection-This can be in the form of Meningitis, which can be deadly.  Epidural Infections-This can be in the form of an epidural abscess, which can cause pressure inside of the spine, causing compression of the  spinal cord with subsequent paralysis. This would require an emergency surgery to decompress, and there are no guarantees that the patient would recover from the paralysis.  Discitis-This is an infection of the intervertebral discs.  It occurs in about 1% of discography procedures.  It is difficult to treat and it may lead to surgery.        2. Pain: the needles have to go through skin and soft tissues, will cause soreness.       3. Damage to internal structures:  The nerves to be lesioned may be near blood vessels or    other nerves which can be potentially damaged.       4. Bleeding: Bleeding is more common if the patient is taking blood thinners such as  aspirin, Coumadin, Ticiid, Plavix, etc., or if he/she have some genetic predisposition  such as hemophilia. Bleeding into the spinal canal can cause compression of the spinal  cord with subsequent paralysis.  This would  require an emergency surgery to  decompress and there are no guarantees that the patient would recover from the  paralysis.       5. Pneumothorax:  Puncturing of a lung is a possibility, every time a needle is introduced in  the area of the chest or upper back.  Pneumothorax refers to free air around the  collapsed lung(s), inside of the thoracic cavity (chest cavity).  Another two possible  complications related to a similar event would include: Hemothorax and Chylothorax.   These are variations of the Pneumothorax, where instead of air around the collapsed  lung(s), you may have blood or chyle, respectively.       6. Spinal headaches: They may occur with any procedures in the area of the spine.       7. Persistent CSF (Cerebro-Spinal Fluid) leakage: This is a rare problem, but may occur  with prolonged intrathecal or epidural catheters either due to the formation of a fistulous  track or a dural tear.       8. Nerve damage: By working so close to the spinal cord, there is always a possibility of  nerve damage, which could be as serious as a permanent spinal cord injury with  paralysis.       9. Death:  Although rare, severe deadly allergic reactions known as "Anaphylactic  reaction" can occur to any of the medications used.      10. Worsening of the symptoms:  We can always make thing worse.  What are the chances of something like this happening? Chances of any of this occuring are extremely low.  By statistics, you have more of a chance of getting killed in a motor vehicle accident: while driving to the hospital than any of the above occurring .  Nevertheless, you should be aware that they are possibilities.  In general, it is similar to taking a shower.  Everybody knows that you can slip, hit your head and get killed.  Does that mean that you should not shower again?  Nevertheless always keep in mind that statistics do not mean anything if you happen to be on the wrong side of them.  Even if a procedure  has a 1 (one) in a 1,000,000 (million) chance of going wrong, it you happen to be that one..Also, keep in mind that by statistics, you have more of a chance of having something go wrong when taking medications.  Who should not have this procedure? If you are on a blood thinning  medication (e.g. Coumadin, Plavix, see list of "Blood Thinners"), or if you have an active infection going on, you should not have the procedure.  If you are taking any blood thinners, please inform your physician.  How should I prepare for this procedure?  Do not eat or drink anything at least six hours prior to the procedure.  Bring a driver with you .  It cannot be a taxi.  Come accompanied by an adult that can drive you back, and that is strong enough to help you if your legs get weak or numb from the local anesthetic.  Take all of your medicines the morning of the procedure with just enough water to swallow them.  If you have diabetes, make sure that you are scheduled to have your procedure done first thing in the morning, whenever possible.  If you have diabetes, take only half of your insulin dose and notify our nurse that you have done so as soon as you arrive at the clinic.  If you are diabetic, but only take blood sugar pills (oral hypoglycemic), then do not take them on the morning of your procedure.  You may take them after you have had the procedure.  Do not take aspirin or any aspirin-containing medications, at least eleven (11) days prior to the procedure.  They may prolong bleeding.  Wear loose fitting clothing that may be easy to take off and that you would not mind if it got stained with Betadine or blood.  Do not wear any jewelry or perfume  Remove any nail coloring.  It will interfere with some of our monitoring equipment.  NOTE: Remember that this is not meant to be interpreted as a complete list of all possible complications.  Unforeseen problems may occur.  BLOOD THINNERS The following  drugs contain aspirin or other products, which can cause increased bleeding during surgery and should not be taken for 2 weeks prior to and 1 week after surgery.  If you should need take something for relief of minor pain, you may take acetaminophen which is found in Tylenol,m Datril, Anacin-3 and Panadol. It is not blood thinner. The products listed below are.  Do not take any of the products listed below in addition to any listed on your instruction sheet.  A.P.C or A.P.C with Codeine Codeine Phosphate Capsules #3 Ibuprofen Ridaura  ABC compound Congesprin Imuran rimadil  Advil Cope Indocin Robaxisal  Alka-Seltzer Effervescent Pain Reliever and Antacid Coricidin or Coricidin-D  Indomethacin Rufen  Alka-Seltzer plus Cold Medicine Cosprin Ketoprofen S-A-C Tablets  Anacin Analgesic Tablets or Capsules Coumadin Korlgesic Salflex  Anacin Extra Strength Analgesic tablets or capsules CP-2 Tablets Lanoril Salicylate  Anaprox Cuprimine Capsules Levenox Salocol  Anexsia-D Dalteparin Magan Salsalate  Anodynos Darvon compound Magnesium Salicylate Sine-off  Ansaid Dasin Capsules Magsal Sodium Salicylate  Anturane Depen Capsules Marnal Soma  APF Arthritis pain formula Dewitt's Pills Measurin Stanback  Argesic Dia-Gesic Meclofenamic Sulfinpyrazone  Arthritis Bayer Timed Release Aspirin Diclofenac Meclomen Sulindac  Arthritis pain formula Anacin Dicumarol Medipren Supac  Analgesic (Safety coated) Arthralgen Diffunasal Mefanamic Suprofen  Arthritis Strength Bufferin Dihydrocodeine Mepro Compound Suprol  Arthropan liquid Dopirydamole Methcarbomol with Aspirin Synalgos  ASA tablets/Enseals Disalcid Micrainin Tagament  Ascriptin Doan's Midol Talwin  Ascriptin A/D Dolene Mobidin Tanderil  Ascriptin Extra Strength Dolobid Moblgesic Ticlid  Ascriptin with Codeine Doloprin or Doloprin with Codeine Momentum Tolectin  Asperbuf Duoprin Mono-gesic Trendar  Aspergum Duradyne Motrin or Motrin IB Triminicin  Aspirin  plain, buffered or enteric coated Durasal  Myochrisine Trigesic  Aspirin Suppositories Easprin Nalfon Trillsate  Aspirin with Codeine Ecotrin Regular or Extra Strength Naprosyn Uracel  Atromid-S Efficin Naproxen Ursinus  Auranofin Capsules Elmiron Neocylate Vanquish  Axotal Emagrin Norgesic Verin  Azathioprine Empirin or Empirin with Codeine Normiflo Vitamin E  Azolid Emprazil Nuprin Voltaren  Bayer Aspirin plain, buffered or children's or timed BC Tablets or powders Encaprin Orgaran Warfarin Sodium  Buff-a-Comp Enoxaparin Orudis Zorpin  Buff-a-Comp with Codeine Equegesic Os-Cal-Gesic   Buffaprin Excedrin plain, buffered or Extra Strength Oxalid   Bufferin Arthritis Strength Feldene Oxphenbutazone   Bufferin plain or Extra Strength Feldene Capsules Oxycodone with Aspirin   Bufferin with Codeine Fenoprofen Fenoprofen Pabalate or Pabalate-SF   Buffets II Flogesic Panagesic   Buffinol plain or Extra Strength Florinal or Florinal with Codeine Panwarfarin   Buf-Tabs Flurbiprofen Penicillamine   Butalbital Compound Four-way cold tablets Penicillin   Butazolidin Fragmin Pepto-Bismol   Carbenicillin Geminisyn Percodan   Carna Arthritis Reliever Geopen Persantine   Carprofen Gold's salt Persistin   Chloramphenicol Goody's Phenylbutazone   Chloromycetin Haltrain Piroxlcam   Clmetidine heparin Plaquenil   Cllnoril Hyco-pap Ponstel   Clofibrate Hydroxy chloroquine Propoxyphen         Before stopping any of these medications, be sure to consult the physician who ordered them.  Some, such as Coumadin (Warfarin) are ordered to prevent or treat serious conditions such as "deep thrombosis", "pumonary embolisms", and other heart problems.  The amount of time that you may need off of the medication may also vary with the medication and the reason for which you were taking it.  If you are taking any of these medications, please make sure you notify your pain physician before you undergo any  procedures.

## 2015-12-01 LAB — HM COLONOSCOPY

## 2015-12-21 ENCOUNTER — Encounter: Payer: Self-pay | Admitting: Pain Medicine

## 2015-12-21 ENCOUNTER — Ambulatory Visit: Payer: BLUE CROSS/BLUE SHIELD | Attending: Pain Medicine | Admitting: Pain Medicine

## 2015-12-21 VITALS — BP 127/75 | HR 107 | Temp 97.3°F | Resp 14 | Ht 62.0 in | Wt 120.0 lb

## 2015-12-21 DIAGNOSIS — M533 Sacrococcygeal disorders, not elsewhere classified: Secondary | ICD-10-CM

## 2015-12-21 DIAGNOSIS — IMO0002 Reserved for concepts with insufficient information to code with codable children: Secondary | ICD-10-CM

## 2015-12-21 DIAGNOSIS — M5126 Other intervertebral disc displacement, lumbar region: Secondary | ICD-10-CM | POA: Diagnosis not present

## 2015-12-21 DIAGNOSIS — M47816 Spondylosis without myelopathy or radiculopathy, lumbar region: Secondary | ICD-10-CM

## 2015-12-21 DIAGNOSIS — M5136 Other intervertebral disc degeneration, lumbar region: Secondary | ICD-10-CM | POA: Diagnosis not present

## 2015-12-21 DIAGNOSIS — G629 Polyneuropathy, unspecified: Secondary | ICD-10-CM

## 2015-12-21 DIAGNOSIS — M5416 Radiculopathy, lumbar region: Secondary | ICD-10-CM

## 2015-12-21 DIAGNOSIS — M545 Low back pain: Secondary | ICD-10-CM | POA: Diagnosis present

## 2015-12-21 MED ORDER — MIDAZOLAM HCL 5 MG/5ML IJ SOLN
INTRAMUSCULAR | Status: AC
  Administered 2015-12-21: 14:00:00
  Filled 2015-12-21: qty 5

## 2015-12-21 MED ORDER — TRIAMCINOLONE ACETONIDE 40 MG/ML IJ SUSP
INTRAMUSCULAR | Status: AC
  Administered 2015-12-21: 14:00:00
  Filled 2015-12-21: qty 1

## 2015-12-21 MED ORDER — BUPIVACAINE HCL (PF) 0.25 % IJ SOLN
INTRAMUSCULAR | Status: AC
  Administered 2015-12-21: 14:00:00
  Filled 2015-12-21: qty 30

## 2015-12-21 MED ORDER — ORPHENADRINE CITRATE 30 MG/ML IJ SOLN
INTRAMUSCULAR | Status: AC
  Administered 2015-12-21: 14:00:00
  Filled 2015-12-21: qty 2

## 2015-12-21 MED ORDER — SODIUM CHLORIDE 0.9 % IJ SOLN
INTRAMUSCULAR | Status: AC
  Administered 2015-12-21: 14:00:00
  Filled 2015-12-21: qty 20

## 2015-12-21 MED ORDER — FLUCONAZOLE 150 MG PO TABS: ORAL_TABLET | ORAL | 0 refills | Status: DC

## 2015-12-21 MED ORDER — CEFAZOLIN IN D5W 1 GM/50ML IV SOLN: 1.0000 g | Freq: Once | INTRAVENOUS | Status: DC

## 2015-12-21 MED ORDER — CEFAZOLIN SODIUM 1 G IJ SOLR
INTRAMUSCULAR | Status: AC
  Administered 2015-12-21: 14:00:00
  Filled 2015-12-21: qty 10

## 2015-12-21 MED ORDER — FENTANYL CITRATE (PF) 100 MCG/2ML IJ SOLN
INTRAMUSCULAR | Status: AC
  Filled 2015-12-21: qty 2

## 2015-12-21 MED ORDER — CEFUROXIME AXETIL 250 MG PO TABS: 250.0000 mg | ORAL_TABLET | Freq: Two times a day (BID) | ORAL | 0 refills | Status: DC

## 2015-12-21 NOTE — Patient Instructions (Addendum)
PLAN   Continue present medications Opana and Zanaflex at this time. NO SKELAXIN Please get Ceftin antibiotic and Diflucan today and begin taking Ceftin antibiotic and Diflucan today as prescribed  F/U PCP  for evaliation of  BP and general medical condition as discussed  F/U surgical evaluation as discussed. Need to consider evaluation of hand by hand surgeon as we discussed as well  F/U neurological evaluation. May consider [facility] EMG studies and other studies   May consider radiofrequency rhizolysis or intraspinal procedures pending response to present treatment and F/U evaluation   Patient to call [facility] should patient have concerns prior to scheduled return appointmentPain Management Discharge Instructions  General Discharge Instructions :  If you need to reach your doctor call: Monday-Friday 8:00 am - 4:00 pm at [phone] or toll free [phone].  After clinic hours [phone] to have operator reach doctor.  Bring all of your medication bottles to all your appointments in the pain clinic.  To cancel or reschedule your appointment with Pain Management please remember to call 24 hours in advance to avoid a fee.  Refer to the educational materials which you have been given on: General Risks, I had my Procedure. Discharge Instructions, Post Sedation.  Post Procedure Instructions:  The drugs you were given will stay in your system until tomorrow, so for the next 24 hours you should not drive, make any legal decisions or drink any alcoholic beverages.  You may eat anything you prefer, but it is better to start with liquids then soups and crackers, and gradually work up to solid foods.  Please notify your doctor immediately if you have any unusual bleeding, trouble breathing or pain that is not related to your normal pain.  Depending on the type of procedure that was done, some parts of your body may feel week and/or numb.  This usually clears up by tonight or  the next day.  Walk with the use of an assistive device or accompanied by an adult for the 24 hours.  You may use ice on the affected area for the first 24 hours.  Put ice in a Ziploc bag and cover with a towel and place against area 15 minutes on 15 minutes off.  You may switch to heat after 24 hours.GENERAL RISKS AND COMPLICATIONS  What are the risk, side effects and possible complications? Generally speaking, most procedures are safe.  However, with any procedure there are risks, side effects, and the possibility of complications.  The risks and complications are dependent upon the sites that are lesioned, or the type of nerve block to be performed.  The closer the procedure is to the spine, the more serious the risks are.  Great care is taken when placing the radio frequency needles, block needles or lesioning probes, but sometimes complications can occur. 1. Infection: Any time there is an injection through the skin, there is a risk of infection.  This is why sterile conditions are used for these blocks.  There are four possible types of infection. 1. Localized skin infection. 2. Central Nervous System Infection-This can be in the form of Meningitis, which can be deadly. 3. Epidural Infections-This can be in the form of an epidural abscess, which can cause pressure inside of the spine, causing compression of the spinal cord with subsequent paralysis. This would require an emergency surgery to decompress, and there are no guarantees that the patient would recover from the paralysis. 4. Discitis-This is an infection of the intervertebral discs.  It  occurs in about 1% of discography procedures.  It is difficult to treat and it may lead to surgery.        2. Pain: the needles have to go through skin and soft tissues, will cause soreness.       3. Damage to internal structures:  The nerves to be lesioned may be near blood vessels or    other nerves which can be potentially damaged.        4. Bleeding: Bleeding is more common if the patient is taking blood thinners such as  aspirin, Coumadin, Ticiid, Plavix, etc., or if he/she have some genetic predisposition  such as hemophilia. Bleeding into the spinal canal can cause compression of the spinal  cord with subsequent paralysis.  This would require an emergency surgery to  decompress and there are no guarantees that the patient would recover from the  paralysis.       5. Pneumothorax:  Puncturing of a lung is a possibility, every time a needle is introduced in  the area of the chest or upper back.  Pneumothorax refers to free air around the  collapsed lung(s), inside of the thoracic cavity (chest cavity).  Another two possible  complications related to a similar event would include: Hemothorax and Chylothorax.   These are variations of the Pneumothorax, where instead of air around the collapsed  lung(s), you may have blood or chyle, respectively.       6. Spinal headaches: They may occur with any procedures in the area of the spine.       7. Persistent CSF (Cerebro-Spinal Fluid) leakage: This is a rare problem, but may occur  with prolonged intrathecal or epidural catheters either due to the formation of a fistulous  track or a dural tear.       8. Nerve damage: By working so close to the spinal cord, there is always a possibility of  nerve damage, which could be as serious as a permanent spinal cord injury with  paralysis.       9. Death:  Although rare, severe deadly allergic reactions known as "Anaphylactic  reaction" can occur to any of the medications used.      10. Worsening of the symptoms:  We can always make thing worse.  What are the chances of something like this happening? Chances of any of this occuring are extremely low.  By statistics, you have more of a chance of getting killed in a motor vehicle accident: while driving to the hospital than any of the above occurring .  Nevertheless, you should be aware that they are  possibilities.  In general, it is similar to taking a shower.  Everybody knows that you can slip, hit your head and get killed.  Does that mean that you should not shower again?  Nevertheless always keep in mind that statistics do not mean anything if you happen to be on the wrong side of them.  Even if a procedure has a 1 (one) in a 1,000,000 (million) chance of going wrong, it you happen to be that one..Also, keep in mind that by statistics, you have more of a chance of having something go wrong when taking medications.  Who should not have this procedure? If you are on a blood thinning medication (e.g. Coumadin, Plavix, see list of "Blood Thinners"), or if you have an active infection going on, you should not have the procedure.  If you are taking any blood thinners, please inform your physician.  How should I prepare for this procedure?  Do not eat or drink anything at least six hours prior to the procedure.  Bring a driver with you .  It cannot be a taxi.  Come accompanied by an adult that can drive you back, and that is strong enough to help you if your legs get weak or numb from the local anesthetic.  Take all of your medicines the morning of the procedure with just enough water to swallow them.  If you have diabetes, make sure that you are scheduled to have your procedure done first thing in the morning, whenever possible.  If you have diabetes, take only half of your insulin dose and notify our nurse that you have done so as soon as you arrive at the clinic.  If you are diabetic, but only take blood sugar pills (oral hypoglycemic), then do not take them on the morning of your procedure.  You may take them after you have had the procedure.  Do not take aspirin or any aspirin-containing medications, at least eleven (11) days prior to the procedure.  They may prolong bleeding.  Wear loose fitting clothing that may be easy to take off and that you would not mind if it got stained with  Betadine or blood.  Do not wear any jewelry or perfume  Remove any nail coloring.  It will interfere with some of our monitoring equipment.  NOTE: Remember that this is not meant to be interpreted as a complete list of all possible complications.  Unforeseen problems may occur.  BLOOD THINNERS The following drugs contain aspirin or other products, which can cause increased bleeding during surgery and should not be taken for 2 weeks prior to and 1 week after surgery.  If you should need take something for relief of minor pain, you may take acetaminophen which is found in Tylenol,m Datril, Anacin-3 and Panadol. It is not blood thinner. The products listed below are.  Do not take any of the products listed below in addition to any listed on your instruction sheet.  A.P.C or A.P.C with Codeine Codeine Phosphate Capsules #3 Ibuprofen Ridaura  ABC compound Congesprin Imuran rimadil  Advil Cope Indocin Robaxisal  Alka-Seltzer Effervescent Pain Reliever and Antacid Coricidin or Coricidin-D  Indomethacin Rufen  Alka-Seltzer plus Cold Medicine Cosprin Ketoprofen S-A-C Tablets  Anacin Analgesic Tablets or Capsules Coumadin Korlgesic Salflex  Anacin Extra Strength Analgesic tablets or capsules CP-2 Tablets Lanoril Salicylate  Anaprox Cuprimine Capsules Levenox Salocol  Anexsia-D Dalteparin Magan Salsalate  Anodynos Darvon compound Magnesium Salicylate Sine-off  Ansaid Dasin Capsules Magsal Sodium Salicylate  Anturane Depen Capsules Marnal Soma  APF Arthritis pain formula Dewitt's Pills Measurin Stanback  Argesic Dia-Gesic Meclofenamic Sulfinpyrazone  Arthritis Bayer Timed Release Aspirin Diclofenac Meclomen Sulindac  Arthritis pain formula Anacin Dicumarol Medipren Supac  Analgesic (Safety coated) Arthralgen Diffunasal Mefanamic Suprofen  Arthritis Strength Bufferin Dihydrocodeine Mepro Compound Suprol  Arthropan liquid Dopirydamole Methcarbomol with Aspirin Synalgos  ASA tablets/Enseals Disalcid  Micrainin Tagament  Ascriptin Doan's Midol Talwin  Ascriptin A/D Dolene Mobidin Tanderil  Ascriptin Extra Strength Dolobid Moblgesic Ticlid  Ascriptin with Codeine Doloprin or Doloprin with Codeine Momentum Tolectin  Asperbuf Duoprin Mono-gesic Trendar  Aspergum Duradyne Motrin or Motrin IB Triminicin  Aspirin plain, buffered or enteric coated Durasal Myochrisine Trigesic  Aspirin Suppositories Easprin Nalfon Trillsate  Aspirin with Codeine Ecotrin Regular or Extra Strength Naprosyn Uracel  Atromid-S Efficin Naproxen Ursinus  Auranofin Capsules Elmiron Neocylate Vanquish  Axotal Emagrin Norgesic Verin  Azathioprine  Empirin or Empirin with Codeine Normiflo Vitamin E  Azolid Emprazil Nuprin Voltaren  Bayer Aspirin plain, buffered or children's or timed BC Tablets or powders Encaprin Orgaran Warfarin Sodium  Buff-a-Comp Enoxaparin Orudis Zorpin  Buff-a-Comp with Codeine Equegesic Os-Cal-Gesic   Buffaprin Excedrin plain, buffered or Extra Strength Oxalid   Bufferin Arthritis Strength Feldene Oxphenbutazone   Bufferin plain or Extra Strength Feldene Capsules Oxycodone with Aspirin   Bufferin with Codeine Fenoprofen Fenoprofen Pabalate or Pabalate-SF   Buffets II Flogesic Panagesic   Buffinol plain or Extra Strength Florinal or Florinal with Codeine Panwarfarin   Buf-Tabs Flurbiprofen Penicillamine   Butalbital Compound Four-way cold tablets Penicillin   Butazolidin Fragmin Pepto-Bismol   Carbenicillin Geminisyn Percodan   Carna Arthritis Reliever Geopen Persantine   Carprofen Gold's salt Persistin   Chloramphenicol Goody's Phenylbutazone   Chloromycetin Haltrain Piroxlcam   Clmetidine heparin Plaquenil   Cllnoril Hyco-pap Ponstel   Clofibrate Hydroxy chloroquine Propoxyphen         Before stopping any of these medications, be sure to consult the physician who ordered them.  Some, such as Coumadin (Warfarin) are ordered to prevent or treat serious conditions such as "deep thrombosis",  "pumonary embolisms", and other heart problems.  The amount of time that you may need off of the medication may also vary with the medication and the reason for which you were taking it.  If you are taking any of these medications, please make sure you notify your pain physician before you undergo any procedures.         GENERAL RISKS AND COMPLICATIONS  What are the risk, side effects and possible complications? Generally speaking, most procedures are safe.  However, with any procedure there are risks, side effects, and the possibility of complications.  The risks and complications are dependent upon the sites that are lesioned, or the type of nerve block to be performed.  The closer the procedure is to the spine, the more serious the risks are.  Great care is taken when placing the radio frequency needles, block needles or lesioning probes, but sometimes complications can occur. Infection: Any time there is an injection through the skin, there is a risk of infection.  This is why sterile conditions are used for these blocks.  There are four possible types of infection. Localized skin infection. Central Nervous System Infection-This can be in the form of Meningitis, which can be deadly. Epidural Infections-This can be in the form of an epidural abscess, which can cause pressure inside of the spine, causing compression of the spinal cord with subsequent paralysis. This would require an emergency surgery to decompress, and there are no guarantees that the patient would recover from the paralysis. Discitis-This is an infection of the intervertebral discs.  It occurs in about 1% of discography procedures.  It is difficult to treat and it may lead to surgery.        2. Pain: the needles have to go through skin and soft tissues, will cause soreness.       3. Damage to internal structures:  The nerves to be lesioned may be near blood vessels or    other nerves which can be potentially damaged.        4. Bleeding: Bleeding is more common if the patient is taking blood thinners such as  aspirin, Coumadin, Ticiid, Plavix, etc., or if he/she have some genetic predisposition  such as hemophilia. Bleeding into the spinal canal can cause compression of the spinal  cord with subsequent  paralysis.  This would require an emergency surgery to  decompress and there are no guarantees that the patient would recover from the  paralysis.       5. Pneumothorax:  Puncturing of a lung is a possibility, every time a needle is introduced in  the area of the chest or upper back.  Pneumothorax refers to free air around the  collapsed lung(s), inside of the thoracic cavity (chest cavity).  Another two possible  complications related to a similar event would include: Hemothorax and Chylothorax.   These are variations of the Pneumothorax, where instead of air around the collapsed  lung(s), you may have blood or chyle, respectively.       6. Spinal headaches: They may occur with any procedures in the area of the spine.       7. Persistent CSF (Cerebro-Spinal Fluid) leakage: This is a rare problem, but may occur  with prolonged intrathecal or epidural catheters either due to the formation of a fistulous  track or a dural tear.       8. Nerve damage: By working so close to the spinal cord, there is always a possibility of  nerve damage, which could be as serious as a permanent spinal cord injury with  paralysis.       9. Death:  Although rare, severe deadly allergic reactions known as "Anaphylactic  reaction" can occur to any of the medications used.      10. Worsening of the symptoms:  We can always make thing worse.  What are the chances of something like this happening? Chances of any of this occuring are extremely low.  By statistics, you have more of a chance of getting killed in a motor vehicle accident: while driving to the hospital than any of the above occurring .  Nevertheless, you should be aware that they are  possibilities.  In general, it is similar to taking a shower.  Everybody knows that you can slip, hit your head and get killed.  Does that mean that you should not shower again?  Nevertheless always keep in mind that statistics do not mean anything if you happen to be on the wrong side of them.  Even if a procedure has a 1 (one) in a 1,000,000 (million) chance of going wrong, it you happen to be that one..Also, keep in mind that by statistics, you have more of a chance of having something go wrong when taking medications.  Who should not have this procedure? If you are on a blood thinning medication (e.g. Coumadin, Plavix, see list of "Blood Thinners"), or if you have an active infection going on, you should not have the procedure.  If you are taking any blood thinners, please inform your physician.  How should I prepare for this procedure? Do not eat or drink anything at least six hours prior to the procedure. Bring a driver with you .  It cannot be a taxi. Come accompanied by an adult that can drive you back, and that is strong enough to help you if your legs get weak or numb from the local anesthetic. Take all of your medicines the morning of the procedure with just enough water to swallow them. If you have diabetes, make sure that you are scheduled to have your procedure done first thing in the morning, whenever possible. If you have diabetes, take only half of your insulin dose and notify our nurse that you have done so as soon as you arrive at the  clinic. If you are diabetic, but only take blood sugar pills (oral hypoglycemic), then do not take them on the morning of your procedure.  You may take them after you have had the procedure. Do not take aspirin or any aspirin-containing medications, at least eleven (11) days prior to the procedure.  They may prolong bleeding. Wear loose fitting clothing that may be easy to take off and that you would not mind if it got stained with Betadine or  blood. Do not wear any jewelry or perfume Remove any nail coloring.  It will interfere with some of our monitoring equipment.  NOTE: Remember that this is not meant to be interpreted as a complete list of all possible complications.  Unforeseen problems may occur.  BLOOD THINNERS The following drugs contain aspirin or other products, which can cause increased bleeding during surgery and should not be taken for 2 weeks prior to and 1 week after surgery.  If you should need take something for relief of minor pain, you may take acetaminophen which is found in Tylenol,m Datril, Anacin-3 and Panadol. It is not blood thinner. The products listed below are.  Do not take any of the products listed below in addition to any listed on your instruction sheet.  A.P.C or A.P.C with Codeine Codeine Phosphate Capsules #3 Ibuprofen Ridaura  ABC compound Congesprin Imuran rimadil  Advil Cope Indocin Robaxisal  Alka-Seltzer Effervescent Pain Reliever and Antacid Coricidin or Coricidin-D  Indomethacin Rufen  Alka-Seltzer plus Cold Medicine Cosprin Ketoprofen S-A-C Tablets  Anacin Analgesic Tablets or Capsules Coumadin Korlgesic Salflex  Anacin Extra Strength Analgesic tablets or capsules CP-2 Tablets Lanoril Salicylate  Anaprox Cuprimine Capsules Levenox Salocol  Anexsia-D Dalteparin Magan Salsalate  Anodynos Darvon compound Magnesium Salicylate Sine-off  Ansaid Dasin Capsules Magsal Sodium Salicylate  Anturane Depen Capsules Marnal Soma  APF Arthritis pain formula Dewitt's Pills Measurin Stanback  Argesic Dia-Gesic Meclofenamic Sulfinpyrazone  Arthritis Bayer Timed Release Aspirin Diclofenac Meclomen Sulindac  Arthritis pain formula Anacin Dicumarol Medipren Supac  Analgesic (Safety coated) Arthralgen Diffunasal Mefanamic Suprofen  Arthritis Strength Bufferin Dihydrocodeine Mepro Compound Suprol  Arthropan liquid Dopirydamole Methcarbomol with Aspirin Synalgos  ASA tablets/Enseals Disalcid Micrainin  Tagament  Ascriptin Doan's Midol Talwin  Ascriptin A/D Dolene Mobidin Tanderil  Ascriptin Extra Strength Dolobid Moblgesic Ticlid  Ascriptin with Codeine Doloprin or Doloprin with Codeine Momentum Tolectin  Asperbuf Duoprin Mono-gesic Trendar  Aspergum Duradyne Motrin or Motrin IB Triminicin  Aspirin plain, buffered or enteric coated Durasal Myochrisine Trigesic  Aspirin Suppositories Easprin Nalfon Trillsate  Aspirin with Codeine Ecotrin Regular or Extra Strength Naprosyn Uracel  Atromid-S Efficin Naproxen Ursinus  Auranofin Capsules Elmiron Neocylate Vanquish  Axotal Emagrin Norgesic Verin  Azathioprine Empirin or Empirin with Codeine Normiflo Vitamin E  Azolid Emprazil Nuprin Voltaren  Bayer Aspirin plain, buffered or children's or timed BC Tablets or powders Encaprin Orgaran Warfarin Sodium  Buff-a-Comp Enoxaparin Orudis Zorpin  Buff-a-Comp with Codeine Equegesic Os-Cal-Gesic   Buffaprin Excedrin plain, buffered or Extra Strength Oxalid   Bufferin Arthritis Strength Feldene Oxphenbutazone   Bufferin plain or Extra Strength Feldene Capsules Oxycodone with Aspirin   Bufferin with Codeine Fenoprofen Fenoprofen Pabalate or Pabalate-SF   Buffets II Flogesic Panagesic   Buffinol plain or Extra Strength Florinal or Florinal with Codeine Panwarfarin   Buf-Tabs Flurbiprofen Penicillamine   Butalbital Compound Four-way cold tablets Penicillin   Butazolidin Fragmin Pepto-Bismol   Carbenicillin Geminisyn Percodan   Carna Arthritis Reliever Geopen Persantine   Carprofen Gold's salt Persistin   Chloramphenicol Goody's  Phenylbutazone   Chloromycetin Haltrain Piroxlcam   Clmetidine heparin Plaquenil   Cllnoril Hyco-pap Ponstel   Clofibrate Hydroxy chloroquine Propoxyphen         Before stopping any of these medications, be sure to consult the physician who ordered them.  Some, such as Coumadin (Warfarin) are ordered to prevent or treat serious conditions such as "deep thrombosis", "pumonary  embolisms", and other heart problems.  The amount of time that you may need off of the medication may also vary with the medication and the reason for which you were taking it.  If you are taking any of these medications, please make sure you notify your pain physician before you undergo any procedures.         Facet Joint Block The facet joints connect the bones of the spine (vertebrae). They make it possible for you to bend, twist, and make other movements with your spine. They also prevent you from overbending, overtwisting, and making other excessive movements.  A facet joint block is a procedure where a numbing medicine (anesthetic) is injected into a facet joint. Often, a type of anti-inflammatory medicine called a steroid is also injected. A facet joint block may be done for two reasons:  Diagnosis. A facet joint block may be done as a test to see whether neck or back pain is caused by a worn-down or infected facet joint. If the pain gets better after a facet joint block, it means the pain is probably coming from the facet joint. If the pain does not get better, it means the pain is probably not coming from the facet joint.  Therapy. A facet joint block may be done to relieve neck or back pain caused by a facet joint. A facet joint block is only done as a therapy if the pain does not improve with medicine, exercise programs, physical therapy, and other forms of pain management. LET [facility] CARE PROVIDER KNOW ABOUT:  Any allergies you have.  All medicines you are taking, including vitamins, herbs, eyedrops, and over-the-counter medicines and creams.  Previous problems you or members of your family have had with the use of anesthetics.  Any blood disorders you have had.  Other health problems you have. RISKS AND COMPLICATIONS Generally, having a facet joint block is safe. However, as with any procedure, complications can occur. Possible complications associated with having a facet  joint block include:  Bleeding.  Injury to a nerve near the injection site.  Pain at the injection site.  Weakness or numbness in areas controlled by nerves near the injection site.  Infection.  Temporary fluid retention.  Allergic reaction to anesthetics or medicines used during the procedure. BEFORE THE PROCEDURE  Follow your health care provider's instructions if you are taking dietary supplements or medicines. You may need to stop taking them or reduce your dosage.  Do not take any new dietary supplements or medicines without asking your health care provider first.  Follow your health care provider's instructions about eating and drinking before the procedure. You may need to stop eating and drinking several hours before the procedure.  Arrange to have an adult drive you home after the procedure. PROCEDURE You may need to remove your clothing and dress in an open-back gown so that your health care provider can access your spine.  The procedure will be done while you are lying on an X-ray table. Most of the time you will be asked to lie on your stomach, but you may be asked  to lie in a different position if an injection will be made in your neck.  Special machines will be used to monitor your oxygen levels, heart rate, and blood pressure.  If an injection will be made in your neck, an intravenous (IV) tube will be inserted into one of your veins. Fluids and medicine will flow directly into your body through the IV tube.  The area over the facet joint where the injection will be made will be cleaned with an antiseptic soap. The surrounding skin will be covered with sterile drapes.  An anesthetic will be applied to your skin to make the injection area numb. You may feel a temporary stinging or burning sensation.  A video X-ray machine will be used to locate the joint. A contrast dye may be injected into the facet joint area to help with locating the joint.  When the joint is  located, an anesthetic medicine will be injected into the joint through the needle.  Your health care provider will ask you whether you feel pain relief. If you do feel relief, a steroid may be injected to provide pain relief for a longer period of time. If you do not feel relief or feel only partial relief, additional injections of an anesthetic may be made in other facet joints.  The needle will be removed, the skin will be cleansed, and bandages will be applied.  AFTER THE PROCEDURE  You will be observed for 15-30 minutes before being allowed to go home. Do not drive. Have an adult drive you or take a taxi or public transportation instead.  If you feel pain relief, the pain will return in several hours or days when the anesthetic wears off.  You may feel pain relief 2-14 days after the procedure. The amount of time this relief lasts varies from person to person.  It is normal to feel some tenderness over the injected area(s) for 2 days following the procedure.  If you have diabetes, you may have a temporary increase in blood sugar.   This information is not intended to replace advice given to you by your health care provider. Make sure you discuss any questions you have with your health care provider.   Document Released: 09/19/2006 Document Revised: 05/21/2014 Document Reviewed: 02/18/2012 Elsevier Interactive Patient Education [employer]

## 2015-12-21 NOTE — Progress Notes (Signed)
PROCEDURE PERFORMED: Lumbar facet (medial branch block)   NOTE: The patient is a 45 y.o. female who [facility] evaluation and treatment of pain involving the lumbar and lower extremity region. MRI  revealed the patient to be with evidence of degenerative disc disease lumbar spine Small focal extraforaminal disc protrusion on the left at L4-5 potentially irritating the left L4 nerve root. Focal central disc protrusion at L5-S1 possibly irritating both S1 nerve roots.. There is concern regarding significant component of patient's pain being due to lumbar facet syndrome The risks, benefits, and expectations of the procedure have been discussed and explained to the patient who was understanding and in agreement with suggested treatment plan. We will proceed with interventional treatment as discussed and as explained to the patient who was understanding and wished to proceed with procedure as planned.   DESCRIPTION OF PROCEDURE: Lumbar facet (medial branch block) with EKG, blood pressure, pulse, capnography, and pulse oximetry monitoring. The procedure was performed with the patient in the prone position. Betadine prep of proposed entry site performed.   NEEDLE PLACEMENT AT: Left L 2 lumbar facet (medial branch block). Under fluoroscopic guidance with oblique orientation of 15 degrees, a 22-gauge needle was inserted at the L 2 vertebral body level with needle placed at the targeted area of Burton's Eye or Eye of the Scotty Dog with documentation of needle placement in the superior and lateral border of targeted area of Burton's Eye or Eye of the Scotty Dog with oblique orientation of 15 degrees. Following documentation of needle placement at the L 2 vertebral body level, needle placement was then accomplished at the L 3 vertebral body level.   NEEDLE PLACEMENT AT L3, L4, and L5 VERTEBRAL BODY LEVELS ON THE LEFT SIDE The procedure was performed at the L3, L4, and L5  vertebral body levels exactly as was performed at the L 2 vertebral body level utilizing the same technique and under fluoroscopic guidance.  NEEDLE PLACEMENT AT THE SACRAL ALA with AP view of the lumbosacral spine. With the patient in the prone position, Betadine prep of proposed entry site accomplished, a 22 gauge needle was inserted in the region of the sacral ala (groove formed by the superior articulating process of S1 and the sacral wing). Following documentation of needle placement at the sacral ala,   Needle placement was then verified at all levels on lateral view. Following documentation of needle placement at all levels on lateral view and following negative aspiration for heme and CSF, each level was injected with 1 mL of preservative-free normal saline with Kenalog.     LUMBAR FACET, MEDIAL BRANCH NERVE, BLOCKS PERFORMED ON THE RIGHT SIDE   The procedure was performed on the right side exactly as was performed on the left side at the same levels and utilizing the same technique under fluoroscopic guidance.     The patient tolerated the procedure well. A total of 40 mg of Kenalog was utilized for the procedure.   PLAN:  1. Medications: The patient will continue presently prescribed medications. 2.  3. And Zanaflex 4. May consider modification of treatment regimen at time of return appointment pending response to treatment rendered on today's visit. 5. The patient is to follow-up with primary care physician for further evaluation of blood pressure and general medical condition status post steroid injection performed on today's visit. 6. Surgical follow-up evaluation. Has been addressed 7. Neurological follow-up evaluation. May consider PNCV EMG studies and other  studies 8. The patient may be candidate for radiofrequency procedures, implantation type procedures, and other treatment pending response to treatment and follow-up evaluation. 9. The patient has been advised to call  the [facility] prior to scheduled return appointment should there be significant change in condition or should patient have other concerns regarding condition prior to scheduled return appointment.  The patient is understanding and in agreement with suggested treatment plan.

## 2015-12-22 ENCOUNTER — Telehealth: Payer: Self-pay

## 2015-12-22 NOTE — Telephone Encounter (Signed)
No answer left message to call if needed. 

## 2015-12-29 ENCOUNTER — Encounter: Payer: Self-pay | Admitting: Pain Medicine

## 2015-12-29 ENCOUNTER — Ambulatory Visit: Payer: BLUE CROSS/BLUE SHIELD | Attending: Pain Medicine | Admitting: Pain Medicine

## 2015-12-29 VITALS — BP 126/91 | HR 121 | Temp 98.6°F | Resp 16 | Ht 62.0 in | Wt 119.0 lb

## 2015-12-29 DIAGNOSIS — M5116 Intervertebral disc disorders with radiculopathy, lumbar region: Secondary | ICD-10-CM | POA: Diagnosis not present

## 2015-12-29 DIAGNOSIS — M5127 Other intervertebral disc displacement, lumbosacral region: Secondary | ICD-10-CM | POA: Diagnosis not present

## 2015-12-29 DIAGNOSIS — M47816 Spondylosis without myelopathy or radiculopathy, lumbar region: Secondary | ICD-10-CM

## 2015-12-29 DIAGNOSIS — M79604 Pain in right leg: Secondary | ICD-10-CM | POA: Diagnosis present

## 2015-12-29 DIAGNOSIS — IMO0002 Reserved for concepts with insufficient information to code with codable children: Secondary | ICD-10-CM | POA: Insufficient documentation

## 2015-12-29 DIAGNOSIS — M62830 Muscle spasm of back: Secondary | ICD-10-CM | POA: Diagnosis not present

## 2015-12-29 DIAGNOSIS — M5136 Other intervertebral disc degeneration, lumbar region: Secondary | ICD-10-CM

## 2015-12-29 DIAGNOSIS — M5126 Other intervertebral disc displacement, lumbar region: Secondary | ICD-10-CM | POA: Insufficient documentation

## 2015-12-29 DIAGNOSIS — M419 Scoliosis, unspecified: Secondary | ICD-10-CM | POA: Insufficient documentation

## 2015-12-29 DIAGNOSIS — M546 Pain in thoracic spine: Secondary | ICD-10-CM | POA: Diagnosis present

## 2015-12-29 DIAGNOSIS — M797 Fibromyalgia: Secondary | ICD-10-CM | POA: Insufficient documentation

## 2015-12-29 DIAGNOSIS — M533 Sacrococcygeal disorders, not elsewhere classified: Secondary | ICD-10-CM | POA: Insufficient documentation

## 2015-12-29 DIAGNOSIS — M5416 Radiculopathy, lumbar region: Secondary | ICD-10-CM

## 2015-12-29 DIAGNOSIS — M79605 Pain in left leg: Secondary | ICD-10-CM | POA: Diagnosis present

## 2015-12-29 MED ORDER — TIZANIDINE HCL 4 MG PO TABS: ORAL_TABLET | ORAL | 0 refills | Status: DC

## 2015-12-29 MED ORDER — OXYMORPHONE HCL 10 MG PO TABS: ORAL_TABLET | ORAL | 0 refills | Status: DC

## 2015-12-29 NOTE — Patient Instructions (Signed)
PLAN   Continue present medications Opana and Zanaflex. Please discuss decreasing and discontinuing Klonopin  F/U PCP  Dr. Selena BattenKim  for evaliation  of  BP and general medical  condition  F/U surgical evaluation. May consider pending follow-up evaluations  F/U Dr. Omelia BlackwaterHeaden as planned and discuss decreasing and discontinuing Klonopin  F/U neurological evaluation. May consider PNCV/EMG studies and other studies pending follow-up evaluations  Ask the secretaries to inform you when insurance approves you for radiofrequency rhizolysis lumbar facet medial branch nerves on the right side as we previously discussed  Patient to call [facility] should patient have concerns prior to scheduled return appointment

## 2015-12-29 NOTE — Progress Notes (Signed)
     The patient is a 45 year old female who returns to pain management for further evaluation and treatment of pain involving the region of the upper mid lower back and lower extremity regions. The patient has had some improvement of her pain with lumbar facet, medial branch nerve blocks. The patient continues to reach some pain involving the region of the buttocks with concern regarding component of pain due to sacroiliac joint dysfunction. Pain is aggravated with standing walking and increases as the day progresses. The patient also is with pain involving the region of the right elbow with history of prior surgery of the elbow. Present time we will continue medications consisting of Zanaflex and Opana and we will consider modification of treatment regimen pending follow-up evaluation. The patient was understanding and agreement suggested treatment plan.     Physical examination   There was tenderness of the splenius capitis and occipitalis region of mild degree with moderate tenderness of the cervical facet cervical paraspinal musculature region with palpation of the acromioclavicular and glenohumeral joint regions reproducing moderate discomfort. The patient was with evidence of tenderness to palpation of the epicondyles of the elbow with no increased warmth erythema of the elbows noted there was questionably decreased grip strength with Tinel and Phalen's maneuver reproducing mild discomfort as well. Palpation of the thoracic region was with tenderness to palpation with no crepitus of the thoracic region noted. There was evidence of muscle spasms involving the lower thoracic paraspinal musculature region. Extension and palpation over the lumbar facets reproduce mild to moderate discomfort with lateral bending rotation reproducing mild to moderate discomfort as well palpation over the region of the PSIS and PII S regions reproduce moderate to moderately severe discomfort with mild tenderness of the  greater trochanteric region iliotibial band region. Straight leg raise was tolerates approximately 30 without increased pain with dorsiflexion noted. There was no sensory deficit or dermatomal distribution detected there was negative clonus negative Homans. DTRs appeared to be trace at the knees. Abdomen nontender with no costovertebral tenderness noted.       Assessment    Degenerative disc disease lumbar spine Small focal extraforaminal disc protrusion on the left at L4-5 potentially irritating the left L4 nerve root. Focal central disc protrusion at L5-S1 possibly irritating both S1 nerve roots  Lumbar radiculopathy  Lumbar facet syndrome  Sacroiliac joint dysfunction  Scoliosis  Fibromyalgia     PLAN   Continue present medications Opana and Zanaflex. Please discuss decreasing and discontinuing Klonopin  F/U PCP  Dr. Selena BattenKim  for evaliation  of  BP and general medical  condition  F/U surgical evaluation. May consider pending follow-up evaluations  F/U Dr. Omelia BlackwaterHeaden as planned and discuss decreasing and discontinuing Klonopin  F/U neurological evaluation. May consider PNCV/EMG studies and other studies pending follow-up evaluations  Ask the secretaries to inform you when insurance approves you for radiofrequency rhizolysis lumbar facet medial branch nerves on the right side as we previously discussed  Patient to call [facility] should patient have concerns prior to scheduled return appointment

## 2015-12-29 NOTE — Progress Notes (Signed)
Safety precautions to be maintained throughout the outpatient stay will include: orient to surroundings, keep bed in low position, maintain call bell within reach at all times, provide assistance with transfer out of bed and ambulation.  

## 2016-01-09 LAB — TOXASSURE SELECT 13 (MW), URINE: PDF: 0

## 2016-01-10 NOTE — Progress Notes (Signed)
Reviewed

## 2016-01-17 ENCOUNTER — Telehealth: Payer: Self-pay | Admitting: *Deleted

## 2016-01-29 ENCOUNTER — Other Ambulatory Visit: Payer: Self-pay | Admitting: Pain Medicine

## 2016-01-30 ENCOUNTER — Encounter: Payer: Self-pay | Admitting: Pain Medicine

## 2016-01-30 ENCOUNTER — Other Ambulatory Visit: Payer: Self-pay | Admitting: Pain Medicine

## 2016-04-12 ENCOUNTER — Other Ambulatory Visit: Payer: Self-pay | Admitting: Internal Medicine

## 2016-04-12 DIAGNOSIS — R945 Abnormal results of liver function studies: Secondary | ICD-10-CM

## 2016-04-23 ENCOUNTER — Ambulatory Visit
Admission: RE | Admit: 2016-04-23 | Discharge: 2016-04-23 | Disposition: A | Discharge status: Home / Self Care | Payer: BLUE CROSS/BLUE SHIELD | Source: Ambulatory Visit | Attending: Internal Medicine | Admitting: Internal Medicine

## 2016-04-23 DIAGNOSIS — R945 Abnormal results of liver function studies: Secondary | ICD-10-CM

## 2016-05-28 ENCOUNTER — Other Ambulatory Visit: Payer: Self-pay | Admitting: Pain Medicine

## 2016-06-25 ENCOUNTER — Other Ambulatory Visit: Payer: Self-pay | Admitting: Pain Medicine

## 2016-08-22 ENCOUNTER — Ambulatory Visit (INDEPENDENT_AMBULATORY_CARE_PROVIDER_SITE_OTHER): Payer: BLUE CROSS/BLUE SHIELD | Admitting: Certified Nurse Midwife

## 2016-08-22 ENCOUNTER — Encounter: Payer: Self-pay | Admitting: Certified Nurse Midwife

## 2016-08-22 VITALS — BP 140/93 | HR 122 | Ht 61.0 in | Wt 116.2 lb

## 2016-08-22 DIAGNOSIS — Z0000 Encounter for general adult medical examination without abnormal findings: Secondary | ICD-10-CM | POA: Diagnosis not present

## 2016-08-22 DIAGNOSIS — M533 Sacrococcygeal disorders, not elsewhere classified: Secondary | ICD-10-CM

## 2016-08-22 DIAGNOSIS — M5136 Other intervertebral disc degeneration, lumbar region: Secondary | ICD-10-CM | POA: Diagnosis not present

## 2016-08-22 DIAGNOSIS — G629 Polyneuropathy, unspecified: Secondary | ICD-10-CM | POA: Diagnosis not present

## 2016-08-22 DIAGNOSIS — M5416 Radiculopathy, lumbar region: Secondary | ICD-10-CM

## 2016-08-22 DIAGNOSIS — M47816 Spondylosis without myelopathy or radiculopathy, lumbar region: Secondary | ICD-10-CM

## 2016-08-22 DIAGNOSIS — Z202 Contact with and (suspected) exposure to infections with a predominantly sexual mode of transmission: Secondary | ICD-10-CM | POA: Diagnosis not present

## 2016-08-22 DIAGNOSIS — M1288 Other specific arthropathies, not elsewhere classified, other specified site: Secondary | ICD-10-CM | POA: Diagnosis not present

## 2016-08-22 MED ORDER — CEFAZOLIN SODIUM 1 G IJ SOLR: 1.0000 g | Freq: Once | INTRAMUSCULAR | Status: DC

## 2016-08-22 NOTE — Progress Notes (Signed)
GYNECOLOGY ANNUAL PREVENTATIVE CARE ENCOUNTER NOTE  Subjective:   Monique Castro is a 46 y.o. female here for a routine annual gynecologic exam.  Current complaints: her husband has recently cheated on her and she is concerned about STD exposure. Denies abnormal vaginal bleeding, discharge, pelvic pain, problems with intercourse or other gynecologic concerns.  She is currently sexually active with her husband and one other female partner.  She does not use condoms. She states that her blood pressure runs 140/90's normaly, she denies any symptoms and states that she has current care managed through primary doctor. She denies needing any annual blood work stating that she has had it done by her primary care MD.    Gynecologic History No LMP recorded. Patient has had a hysterectomy. Contraception: hystorectomy Last Pap:10/31/15. Results were: abnormal ASCUS/ high risk HPV Cervical biopsy completed 12/01/15 Last mammogram: June 2017. Results were: normal per pt, results not on file.   Obstetric History OB History  Gravida Para Term Preterm AB Living  SAB TAB Ectopic Multiple Live Births          2    # Outcome Date GA Lbr Len/2nd Weight Sex Delivery Anes PTL Lv  2 Preterm 10/26/95 [redacted]w[redacted]d  6 lb 8 oz (2.948 kg) F Vag-Spont   LIV  1 Term 05/14/90 [redacted]w[redacted]d  6 lb 2.4 oz (2.79 kg) F Vag-Spont   LIV      Past Medical History:  Diagnosis Date  . Allergy   . Anxiety   . Arthritis   . Depression   . GERD (gastroesophageal reflux disease)   . Neuromuscular disorder (HCC)   . Strep throat     Past Surgical History:  Procedure Laterality Date  . ABDOMINAL HYSTERECTOMY    . APPENDECTOMY    . TUBAL LIGATION        Allergies  Allergen Reactions  . Macrobid  [Nitrofurantoin Macrocrystal] Nausea Only    Uncoded Allergy. Allergen: microbid  . Sulfa Antibiotics Rash  . Amoxicillin-Pot Clavulanate Diarrhea  . Doxycycline Other (See Comments)    GI Upset  . Macrobid  [Nitrofurantoin] Nausea Only    Social History   Social History  . Marital status: Married    Spouse name: N/A  . Number of children: N/A  . Years of education: N/A   Occupational History  . Not on file.   Social History Main Topics  . Smoking status: Current Every Day Smoker    Packs/day: 0.50    Types: Cigarettes  . Smokeless tobacco: Never Used  . Alcohol use 0.0 oz/week     Comment: rarely  . Drug use: No  . Sexual activity: Yes    Partners: Male    Birth control/ protection: None   Other Topics Concern  . Not on file   Social History Narrative  . No narrative on file    Family History  Problem Relation Age of Onset  . Alcohol abuse Father     The following portions of the patient's history were reviewed and updated as appropriate: allergies, current medications, past family history, past medical history, past social history, past surgical history and problem list.  Review of Systems Constitutional: negative Eyes: negative Ears, nose, mouth, throat, and face: negative Respiratory: negative Cardiovascular: negative Gastrointestinal: negative Genitourinary:negative Integument/breast: negative Hematologic/lymphatic: negative Musculoskeletal:negative Neurological: negative Behavioral/Psych: negative, stressed, upset about husband cheating Endocrine: negative Allergic/Immunologic: negative   Objective:  BP (!) 140/93   Pulse (!) 122  Ht  (1.549 m)   Wt 116 lb 3.2 oz (52.7 kg)   BMI 21.96 kg/m  CONSTITUTIONAL: Well-developed, well-nourished female in no acute distress.  HENT:  Normocephalic, atraumatic, External right and left ear normal.  EYES: Conjunctivae and EOM are normal.  NECK: Normal range of motion, supple, no masses.  Normal thyroid.  SKIN: Skin is warm and dry. No rash noted. Not diaphoretic. No erythema. No pallor. NEUROLOGIC: Alert and oriented to person, place, and time. Normal reflexes, muscle tone coordination. No cranial nerve  deficit noted. PSYCHIATRIC: Normal mood and affect. Flight of idea, having trouble staying on topic. Becomes tearful when talking about her husband infidelity   CARDIOVASCULAR: Normal heart rate noted, regular rhythm RESPIRATORY: Clear to auscultation bilaterally. Effort and breath sounds normal, no problems with respiration noted. BREASTS: Symmetric in size. No masses, skin changes, nipple drainage, or lymphadenopathy. ABDOMEN: Soft, normal bowel sounds, no distention noted.  No tenderness, rebound or guarding.  PELVIC: Normal appearing external genitalia; normal appearing vaginal mucosa and cervix.  No abnormal discharge noted. No uterus: Hystorectomy. No other palpable masses,or  tenderness. MUSCULOSKELETAL: Normal range of motion. No tenderness.  No cyanosis, clubbing, or edema.  2+ distal pulses.   Assessment:  Annual gynecologic examination  Mammogram ordered: Schedule for June 2018 STD testing/[facility]Saw[facility]   Plan:  Labs: Pt declines annual blood testing stating that she had recently at her primary care MD. STD testing today. Will follow up lab results and manage accordingly. Mammogram scheduled Routine preventative health maintenance measures emphasized. Stressed condom use and safe sexual practices. Please refer to After Visit Summary for other counseling recommendations.    Doreene Burke, CNM

## 2016-08-22 NOTE — Patient Instructions (Signed)
Cervicitis Cervicitis is when the cervix gets irritated and swollen. Your cervix is the lower end of your uterus. Follow these instructions at home:  Do not have sex until your doctor says it is okay.  Take over-the-counter and prescription medicines only as told by your doctor.  If you were prescribed an antibiotic medicine, take it as told by your doctor. Do not stop taking it even if you start to feel better.  Keep all follow-up visits as told by your doctor. This is important. Contact a doctor if:  Your symptoms come back after treatment.  Your symptoms get worse after treatment.  You have a fever.  You feel tired (fatigued).  Your belly (abdomen) hurts.  You feel like you are going to throw up (are nauseous).  You throw up (vomit).  You have watery poop (diarrhea).  Your back hurts. Get help right away if:  You have very bad pain in your belly, and medicine does not help it.  You cannot pee (urinate). Summary  Cervicitis is when the cervix gets irritated and swollen.  Do not have sex until your doctor says it is okay.  If you need to take an antibiotic, do not stop taking even if you start to feel better. Take medicines only as told by your doctor. This information is not intended to replace advice given to you by your health care provider. Make sure you discuss any questions you have with your health care provider. Document Released: 02/07/2008 Document Revised: 01/15/2016 Document Reviewed: 01/15/2016 Elsevier Interactive Patient Education  2017 [employer]. Safe Sex Practicing safe sex means taking steps before and during sex to reduce your risk of:  Getting an STD (sexually transmitted disease).  Giving your partner an STD.  Unwanted pregnancy. How can I practice safe sex?   To practice safe sex:  Limit your sexual partners to only one partner who is having sex with only you.  Avoid using alcohol and recreational drugs before having sex. These  substances can affect your judgment.  Before having sex with a new partner:  Talk to your partner about past partners, past STDs, and drug use.  You and your partner should be screened for STDs and discuss the results with each other.  Check your body regularly for sores, blisters, rashes, or unusual discharge. If you notice any of these problems, visit your health care provider.  If you have symptoms of an infection or you are being treated for an STD, avoid sexual contact.  While having sex, use a condom. Make sure to:  Use a condom every time you have vaginal, oral, or anal sex. Both females and males should wear condoms during oral sex.  Keep condoms in place from the beginning to the end of sexual activity.  Use a latex condom, if possible. Latex condoms offer the best protection.  Use only water-based lubricants or oils to lubricate a condom. Using petroleum-based lubricants or oils will weaken the condom and increase the chance that it will break.  See your health care provider for regular screenings, exams, and tests for STDs.  Talk with your health care provider about the form of birth control (contraception) that is best for you.  Get vaccinated against hepatitis B and human papillomavirus (HPV).  If you are at risk of being infected with HIV (human immunodeficiency virus), talk with your health care provider about taking a prescription medicine to prevent HIV infection. You are considered at risk for HIV if:  You are a  man who has sex with other men.  You are a heterosexual man or woman who is sexually active with more than one partner.  You take drugs by injection.  You are sexually active with a partner who has HIV. This information is not intended to replace advice given to you by your health care provider. Make sure you discuss any questions you have with your health care provider. Document Released: 06/07/2004 Document Revised: 09/14/2015 Document Reviewed:  03/20/2015 Elsevier Interactive Patient Education  2017 [employer].

## 2016-08-23 LAB — RPR: RPR: NONREACTIVE

## 2016-08-23 LAB — HEPATITIS C ANTIBODY: Hep C Virus Ab: <0.1 s/co ratio (ref 0.0–0.9)

## 2016-08-23 LAB — HSV(HERPES SIMPLEX VRS) I + II AB-IGG
HSV 1 GLYCOPROTEIN G AB, IGG: 42.9 {index} — AB (ref 0.00–0.90)
HSV 2 GLYCOPROTEIN G AB, IGG: 2.18 {index} — AB (ref 0.00–0.90)

## 2016-08-23 LAB — HEPATITIS B SURFACE ANTIGEN: Hepatitis B Surface Ag: NEGATIVE

## 2016-08-23 LAB — HIV ANTIBODY (ROUTINE TESTING W REFLEX): HIV Screen 4th Generation wRfx: NONREACTIVE

## 2016-08-25 LAB — NUSWAB VAGINITIS PLUS (VG+)
ATOPOBIUM VAGINAE: HIGH {score} — AB
CHLAMYDIA TRACHOMATIS, NAA: NEGATIVE
Candida albicans, NAA: NEGATIVE
Candida glabrata, NAA: NEGATIVE
Megasphaera 1: HIGH Score — AB
Neisseria gonorrhoeae, NAA: NEGATIVE
Trich vag by NAA: NEGATIVE

## 2016-08-27 ENCOUNTER — Other Ambulatory Visit: Payer: Self-pay | Admitting: Certified Nurse Midwife

## 2016-08-27 MED ORDER — METRONIDAZOLE 500 MG PO TABS: 500.0000 mg | ORAL_TABLET | Freq: Two times a day (BID) | ORAL | 0 refills | Status: AC

## 2016-08-27 MED ORDER — METRONIDAZOLE 500 MG PO TABS: 500.0000 mg | ORAL_TABLET | Freq: Two times a day (BID) | ORAL | 0 refills | Status: DC

## 2016-08-27 NOTE — Progress Notes (Signed)
Prescription sent to pharmacy for Flagyl. Result reviewed with Mrs. Curitis   Doreene Burke, CNM

## 2017-02-02 IMAGING — CT CT BIOPSY
2 series · 14 of 18 positions shown, 20 images · non-contrast
Comparison: none

CLINICAL DATA: Focal tenderness over the right SI joint. Low back
pain.

[Series 2: si joint injection · axial · 0.74mm/px · z∈[-27,+13]mm · 9 of 11 slices shown, 15 images (1 of 2)]
[im 2/11  soft-tissue]
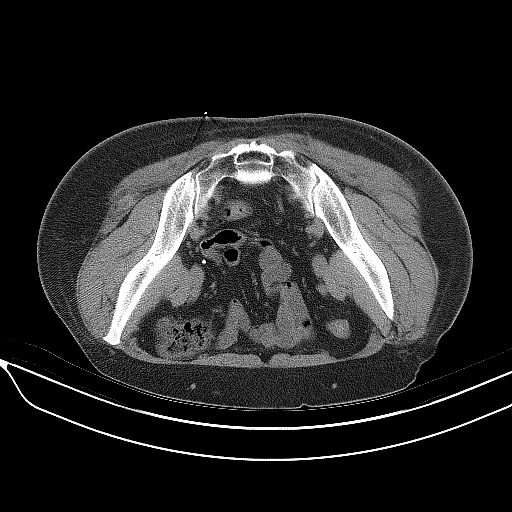
[im 2/11  bone]
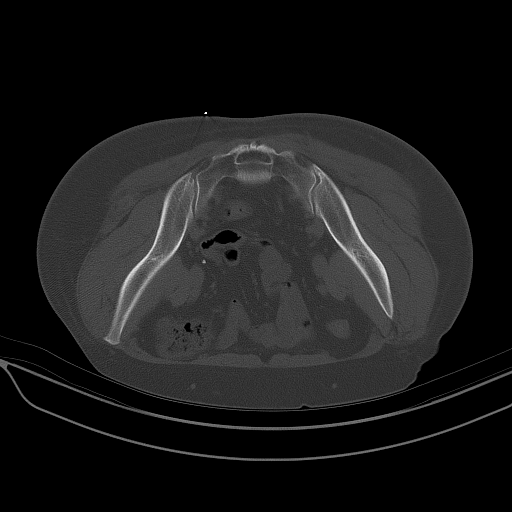
[im 3/11  soft-tissue]
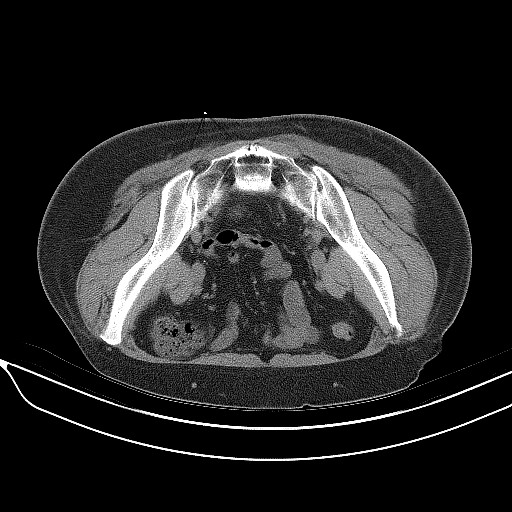
[im 4/11  soft-tissue]
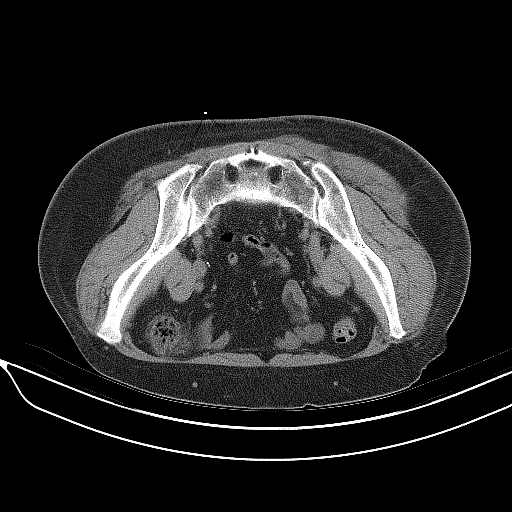
[im 5/11  soft-tissue]
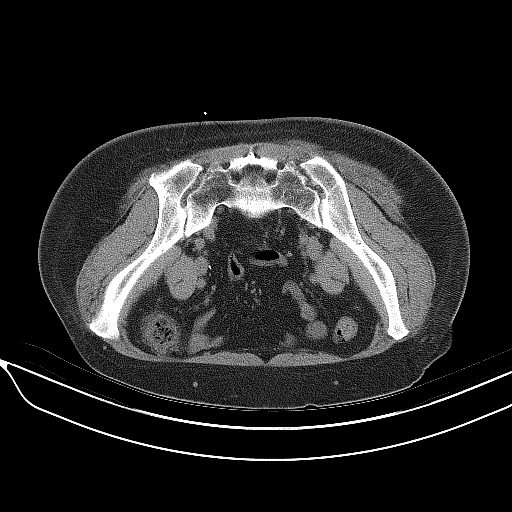
[im 6/11  soft-tissue]
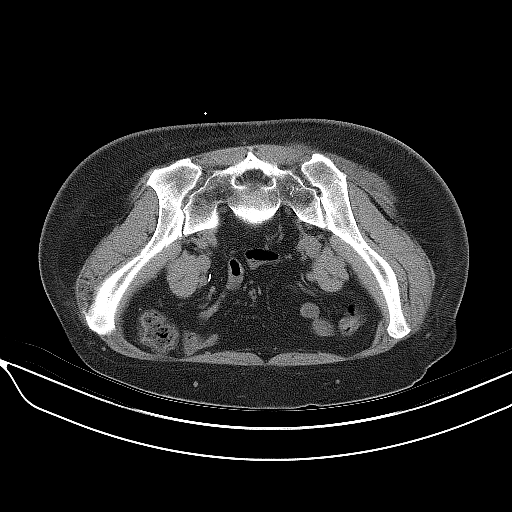
[im 7/11  soft-tissue]
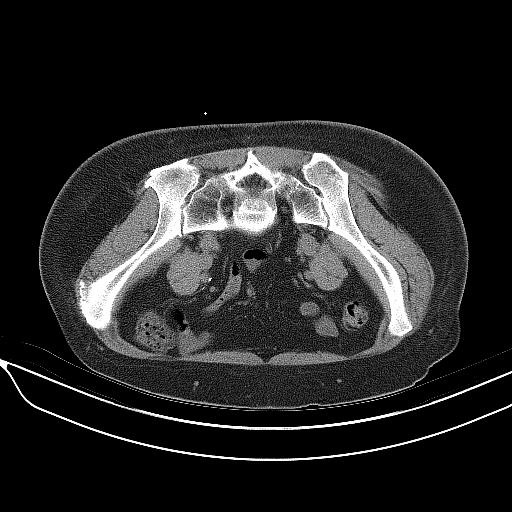
[im 7/11  lung]
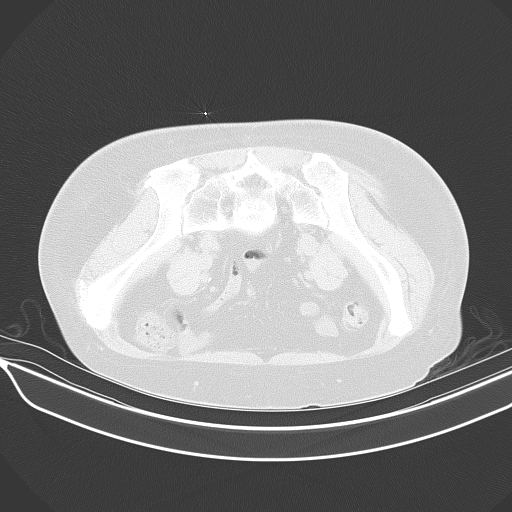
[im 8/11  soft-tissue]
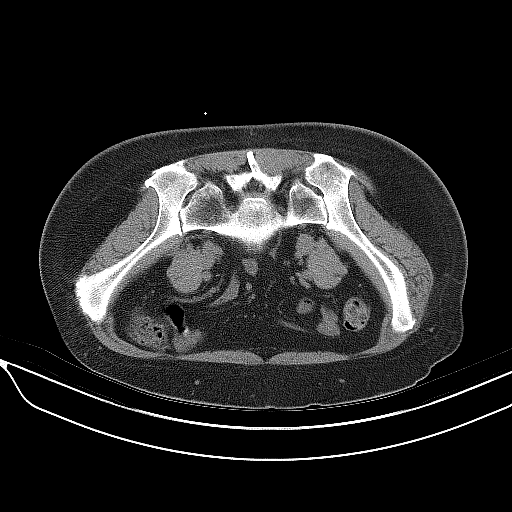
[im 8/11  lung]
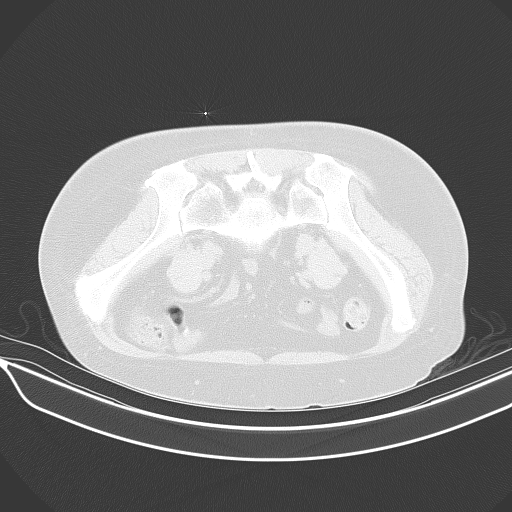
[im 9/11  soft-tissue]
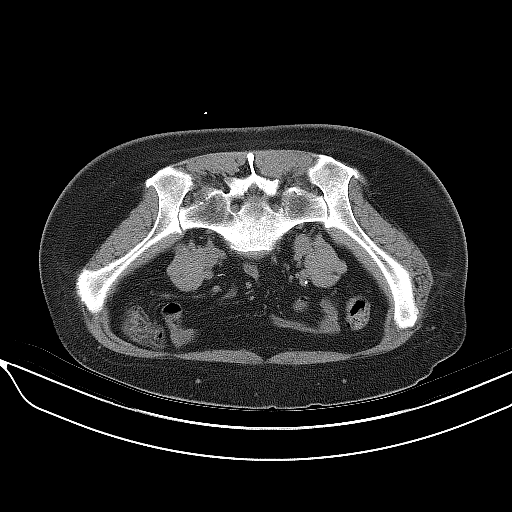
[im 9/11  lung]
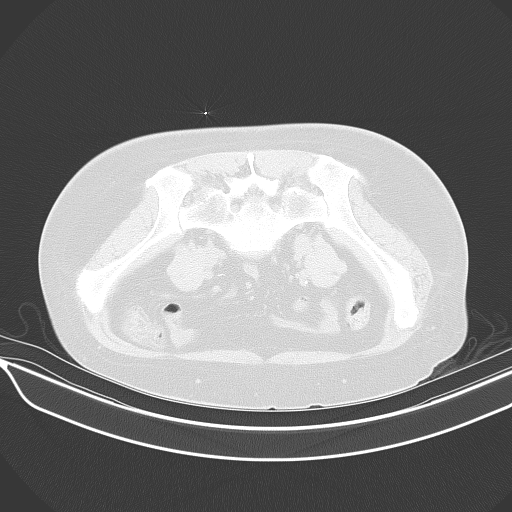
[im 10/11  soft-tissue]
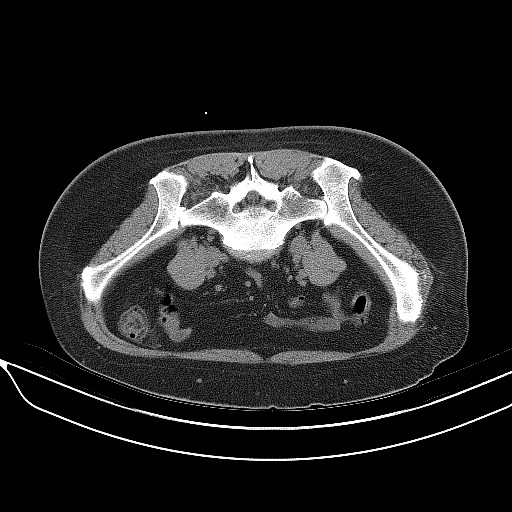
[im 10/11  lung]
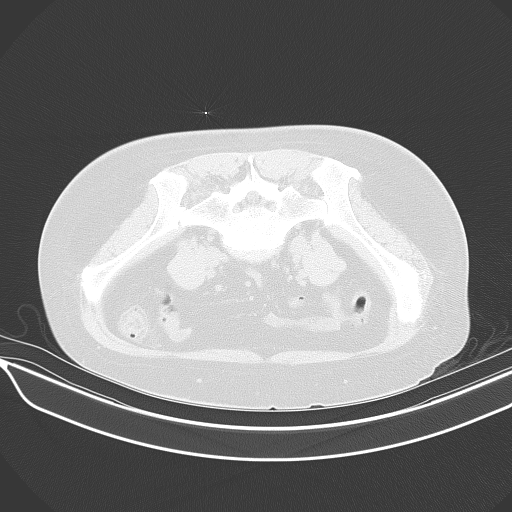
[im 10/11  bone]
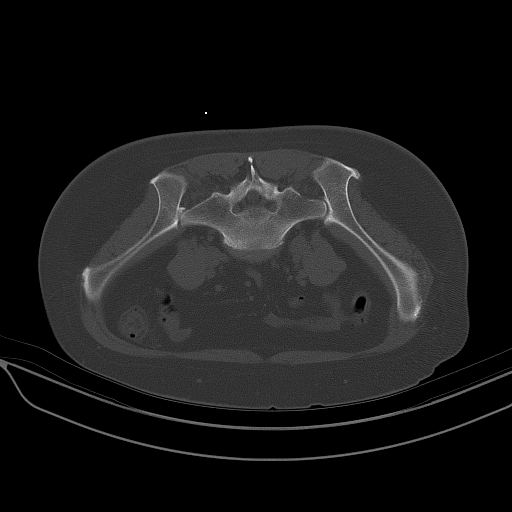

[Series 5: si joint injection · axial · 0.74mm/px · z∈[-34,-14]mm · 5 of 7 slices shown (2 of 2)]
[im 2/7  soft-tissue]
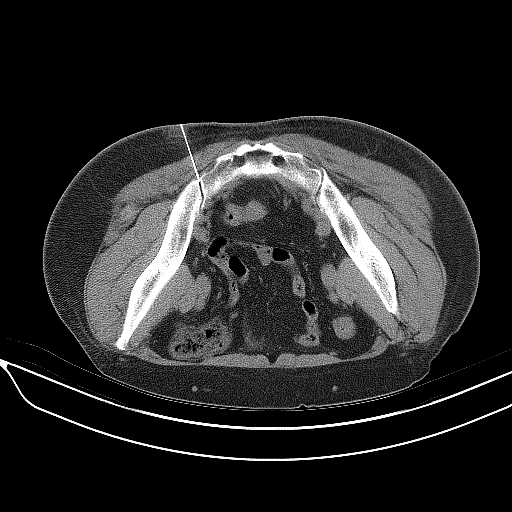
[im 3/7  soft-tissue]
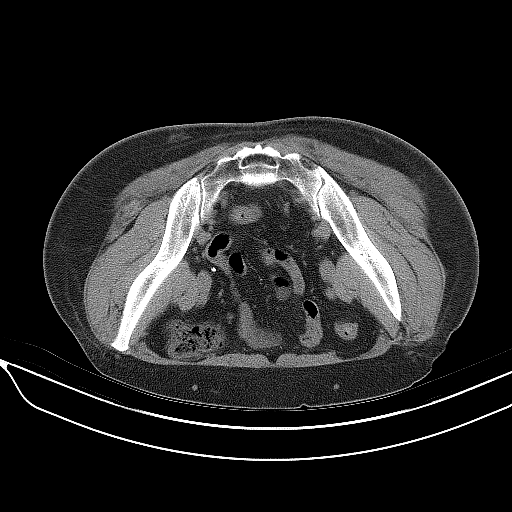
[im 4/7  soft-tissue]
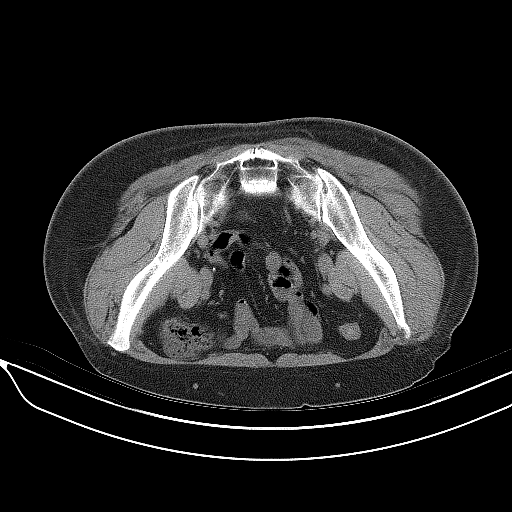
[im 5/7  soft-tissue]
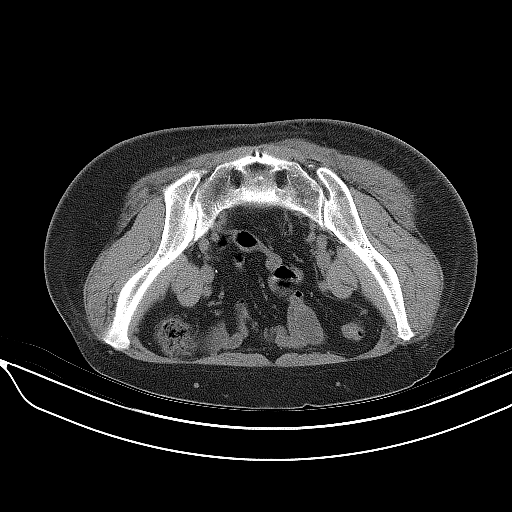
[im 6/7  soft-tissue]
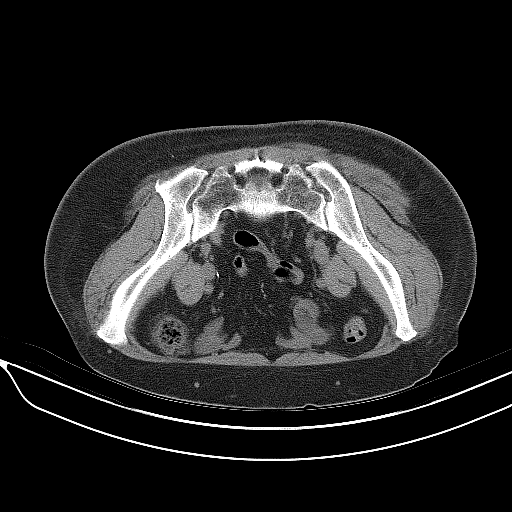

[14 of 18 positions shown; findings below may reference images not displayed]

EXAM:
CT GUIDED RIGHT SI JOINT CORTICOSTEROID INJECTION

FLUOROSCOPY TIME:  None

PROCEDURE:
The procedure, risks, benefits, and alternatives were explained to
the patient. Questions regarding the procedure were encouraged and
answered. The patient understands and consents to the procedure.

RIGHT SACROILIAC JOINT INJECTION: The skin dorsal to the sacroiliac
joint was cleansed and anesthetized. A 22 gauge curved needle was
directed into the inferior aspect of the sacroiliac joint. Intra
articular positioning was confirmed by CT imaging. One hundred
twenty mg Depo-Medrol mixed with 1 cc 1% lidocaine were injected
into the joint.

COMPLICATIONS:
None
IMPRESSION: Technically successful CT-guided right sacroiliac joint injection.

## 2017-02-21 ENCOUNTER — Other Ambulatory Visit: Payer: Self-pay | Admitting: Neurological Surgery

## 2017-03-22 ENCOUNTER — Ambulatory Visit (HOSPITAL_COMMUNITY)
Admission: RE | Admit: 2017-03-22 | Discharge: 2017-03-22 | Disposition: A | Discharge status: Home / Self Care | Payer: BLUE CROSS/BLUE SHIELD | Source: Ambulatory Visit | Attending: Neurological Surgery | Admitting: Neurological Surgery

## 2017-03-22 ENCOUNTER — Other Ambulatory Visit: Payer: Self-pay

## 2017-03-22 ENCOUNTER — Encounter (HOSPITAL_COMMUNITY)
Admission: RE | Admit: 2017-03-22 | Discharge: 2017-03-22 | Disposition: A | Discharge status: Home / Self Care | Payer: BLUE CROSS/BLUE SHIELD | Source: Ambulatory Visit | Attending: Neurological Surgery | Admitting: Neurological Surgery

## 2017-03-22 ENCOUNTER — Encounter (HOSPITAL_COMMUNITY): Payer: Self-pay

## 2017-03-22 DIAGNOSIS — Z01818 Encounter for other preprocedural examination: Secondary | ICD-10-CM | POA: Insufficient documentation

## 2017-03-22 DIAGNOSIS — M4310 Spondylolisthesis, site unspecified: Secondary | ICD-10-CM

## 2017-03-22 HISTORY — DX: Cardiac arrhythmia, unspecified: I49.9

## 2017-03-22 HISTORY — DX: Fibromyalgia: M79.7

## 2017-03-22 LAB — TYPE AND SCREEN
ABO/RH(D): A POS
ANTIBODY SCREEN: NEGATIVE

## 2017-03-22 LAB — CBC WITH DIFFERENTIAL/PLATELET
Basophils Absolute: 0 10*3/uL (ref 0.0–0.1)
Basophils Relative: 0 %
EOS ABS: 0.1 10*3/uL (ref 0.0–0.7)
EOS PCT: 1 %
HCT: 40.2 % (ref 36.0–46.0)
Hemoglobin: 13.4 g/dL (ref 12.0–15.0)
LYMPHS ABS: 3 10*3/uL (ref 0.7–4.0)
LYMPHS PCT: 26 %
MCH: 31.5 pg (ref 26.0–34.0)
MCHC: 33.3 g/dL (ref 30.0–36.0)
MCV: 94.4 fL (ref 78.0–100.0)
MONO ABS: 0.8 10*3/uL (ref 0.1–1.0)
Monocytes Relative: 7 %
Neutro Abs: 7.5 10*3/uL (ref 1.7–7.7)
Neutrophils Relative %: 66 %
PLATELETS: 363 10*3/uL (ref 150–400)
RBC: 4.26 MIL/uL (ref 3.87–5.11)
RDW: 12.6 % (ref 11.5–15.5)
WBC: 11.4 10*3/uL — ABNORMAL HIGH (ref 4.0–10.5)

## 2017-03-22 LAB — BASIC METABOLIC PANEL
Anion gap: 8 (ref 5–15)
BUN: 8 mg/dL (ref 6–20)
CHLORIDE: 105 mmol/L (ref 101–111)
CO2: 23 mmol/L (ref 22–32)
CREATININE: 0.75 mg/dL (ref 0.44–1.00)
Calcium: 9 mg/dL (ref 8.9–10.3)
GFR calc Af Amer: >60 mL/min (ref >60)
GLUCOSE: 109 mg/dL — AB (ref 65–99)
POTASSIUM: 3.6 mmol/L (ref 3.5–5.1)
SODIUM: 136 mmol/L (ref 135–145)

## 2017-03-22 LAB — PROTIME-INR
INR: 0.88
PROTHROMBIN TIME: 11.8 s (ref 11.4–15.2)

## 2017-03-22 LAB — ABO/RH: ABO/RH(D): A POS

## 2017-03-22 LAB — SURGICAL PCR SCREEN
MRSA, PCR: NEGATIVE
STAPHYLOCOCCUS AUREUS: NEGATIVE

## 2017-03-22 NOTE — Progress Notes (Addendum)
UJW:[id number]:James Selena BattenKim, MD  Cardiologist: Dr. Arnoldo HookerBruce Kowalski   EKG: denies past year, obtained today  Stress test: 2015 requested  ECHO: 2015 requested  Cardiac Cath: pt denies  Chest x-ray: pt denies past year

## 2017-03-22 NOTE — Pre-Procedure Instructions (Signed)
Monique Castro  03/22/2017      [facility] [place] - [address] [place] [address] Phone: [phone] Fax: [phone]  [phone] [facility] [address] [facility] [place], [place] - [address] [facility] [address] [place] [address] Phone: [phone] Fax: [phone]    Your procedure is scheduled on March 27, 2017.  [facility] AM.  Call this number if you have problems the morning of surgery:  [phone]   Remember:  Do not eat food or drink liquids after midnight.  Take these medicines the morning of surgery with A SIP OF WATER bupropion (wellbutrin), zyrect-D allergy, desvenlafaxine (pristiq), oxycodone 20 mg-if needed, pantoprazole (protonix), tamsulosin (flomax), nasacort -if needed.   7 days prior to surgery STOP taking any diclofenac gel (voltaren) Aspirin (unless otherwise instructed by your surgeon), Aleve, Naproxen, Ibuprofen, Motrin, Advil, Goody's, BC's, all herbal medications, fish oil, and all vitamins  Continue all other medications as instructed by your physician except follow the above medication instructions before surgery   Do not wear jewelry, make-up or nail polish.  Do not wear lotions, powders, or perfumes, or deoderant.  Do not shave 48 hours prior to surgery.   Do not bring valuables to the hospital.  [facility] is not responsible for any belongings or valuables.  Contacts, dentures or bridgework may not be worn into surgery.  Leave your suitcase in the car.  After surgery it may be brought to your room.  For patients admitted to the hospital, discharge time will be determined by your treatment team.  Patients discharged the day of surgery will not be allowed to drive home.   Special instructions:   [facility]- Preparing For Surgery  Before surgery, you can play an important role. Because skin is not sterile, your skin needs to  be as free of germs as possible. You can reduce the number of germs on your skin by washing with CHG (chlorahexidine gluconate) Soap before surgery.  CHG is an antiseptic cleaner which kills germs and bonds with the skin to continue killing germs even after washing.  Please do not use if you have an allergy to CHG or antibacterial soaps. If your skin becomes reddened/irritated stop using the CHG.  Do not shave (including legs and underarms) for at least 48 hours prior to first CHG shower. It is OK to shave your face.  Please follow these instructions carefully.   1. Shower the NIGHT BEFORE SURGERY and the MORNING OF SURGERY with CHG.   2. If you chose to wash your hair, wash your hair first as usual with your normal shampoo.  3. After you shampoo, rinse your hair and body thoroughly to remove the shampoo.  4. Use CHG as you would any other liquid soap. You can apply CHG directly to the skin and wash gently with a scrungie or a clean washcloth.   5. Apply the CHG Soap to your body ONLY FROM THE NECK DOWN.  Do not use on open wounds or open sores. Avoid contact with your eyes, ears, mouth and genitals (private parts). Wash Face and genitals (private parts)  with your normal soap.  6. Wash thoroughly, paying special attention to the area where your surgery will be performed.  7. Thoroughly rinse your body with warm water from the neck down.  8. DO NOT shower/wash with your normal soap after using and rinsing  off the CHG Soap.  9. Pat yourself dry with a CLEAN TOWEL.  10. Wear CLEAN PAJAMAS to bed the night before surgery, wear comfortable clothes the morning of surgery  11. Place CLEAN SHEETS on your bed the night of your first shower and DO NOT SLEEP WITH PETS.    Day of Surgery: Do not apply any deodorants/lotions. Please wear clean clothes to the hospital/surgery center.     Please read over the following fact sheets that you were given. Pain Booklet, Coughing and Deep Breathing,  MRSA Information and Surgical Site Infection Prevention

## 2017-03-25 NOTE — Progress Notes (Signed)
Anesthesia Chart Review:  Pt is a 46 year old female scheduled for L4-5, L5-S1 PLIF on 03/27/2017 with Marikay Alaravid Jones, MD  - PCP is Pearson GrippeJames Kim, MD - Used to see cardiologist Arnoldo HookerBruce Kowalski, MD for tachycardia.  Last office visit 12/08/14 (notes in care everywhere). F/u prn recommended.   PMH includes:  Tachycardia, GERD. Current smoker. BMI 23  Medications include: vyvanse, protonix, simvastatin  BP 129/74   Pulse 100   Temp 36.8 C   Resp 20   Ht 5\' 1"  (1.549 m)   Wt 120 lb 1.6 oz (54.5 kg)   SpO2 95%   BMI 22.69 kg/m    Preoperative labs reviewed.    CXR 03/22/17: No acute abnormalities.  EKG 03/22/17: NSR. HR 88  Exercise treadmill test 05/24/14 [facility]): Normal treadmill ECG without evidence of ischemia or arrhythmia.  Echo 05/17/14 (care everywhere):  1. Normal LV systolic function. EF >55%. 2. Normal RV systolic function. 3. Mild AR, mild MR, mild TR, mild PR. 4. No valvular stenosis.  Holter monitor 05/06/14 [facility]): occasional PVCs  If no changes, I anticipate pt can proceed with surgery as scheduled.   Rica Mastngela Chizuko Trine, FNP-BC [facility] Phone: [phone] 03/25/2017 2:28 PM

## 2017-03-27 ENCOUNTER — Inpatient Hospital Stay (HOSPITAL_COMMUNITY): Payer: BLUE CROSS/BLUE SHIELD | Admitting: Anesthesiology

## 2017-03-27 ENCOUNTER — Encounter (HOSPITAL_COMMUNITY)
Admission: RE | Disposition: A | Discharge status: Home / Self Care | Payer: Self-pay | Source: Ambulatory Visit | Attending: Neurological Surgery

## 2017-03-27 ENCOUNTER — Inpatient Hospital Stay (HOSPITAL_COMMUNITY)
Admission: RE | Admit: 2017-03-27 | Discharge: 2017-03-29 | DRG: 460 | Disposition: A | Discharge status: Home / Self Care | Payer: BLUE CROSS/BLUE SHIELD | Source: Ambulatory Visit | Attending: Neurological Surgery | Admitting: Neurological Surgery

## 2017-03-27 ENCOUNTER — Inpatient Hospital Stay (HOSPITAL_COMMUNITY): Payer: BLUE CROSS/BLUE SHIELD | Admitting: Emergency Medicine

## 2017-03-27 ENCOUNTER — Inpatient Hospital Stay (HOSPITAL_COMMUNITY): Payer: BLUE CROSS/BLUE SHIELD

## 2017-03-27 ENCOUNTER — Encounter (HOSPITAL_COMMUNITY): Payer: Self-pay | Admitting: *Deleted

## 2017-03-27 DIAGNOSIS — M4317 Spondylolisthesis, lumbosacral region: Secondary | ICD-10-CM | POA: Diagnosis present

## 2017-03-27 DIAGNOSIS — Z881 Allergy status to other antibiotic agents status: Secondary | ICD-10-CM

## 2017-03-27 DIAGNOSIS — Z90710 Acquired absence of both cervix and uterus: Secondary | ICD-10-CM | POA: Diagnosis not present

## 2017-03-27 DIAGNOSIS — Z888 Allergy status to other drugs, medicaments and biological substances status: Secondary | ICD-10-CM | POA: Diagnosis not present

## 2017-03-27 DIAGNOSIS — M5410 Radiculopathy, site unspecified: Secondary | ICD-10-CM | POA: Diagnosis present

## 2017-03-27 DIAGNOSIS — M4316 Spondylolisthesis, lumbar region: Secondary | ICD-10-CM | POA: Diagnosis present

## 2017-03-27 DIAGNOSIS — M48061 Spinal stenosis, lumbar region without neurogenic claudication: Secondary | ICD-10-CM | POA: Diagnosis present

## 2017-03-27 DIAGNOSIS — M4807 Spinal stenosis, lumbosacral region: Secondary | ICD-10-CM | POA: Diagnosis present

## 2017-03-27 DIAGNOSIS — Z419 Encounter for procedure for purposes other than remedying health state, unspecified: Secondary | ICD-10-CM

## 2017-03-27 DIAGNOSIS — Z882 Allergy status to sulfonamides status: Secondary | ICD-10-CM | POA: Diagnosis not present

## 2017-03-27 DIAGNOSIS — Z880 Allergy status to penicillin: Secondary | ICD-10-CM | POA: Diagnosis not present

## 2017-03-27 DIAGNOSIS — Z981 Arthrodesis status: Secondary | ICD-10-CM

## 2017-03-27 DIAGNOSIS — F17210 Nicotine dependence, cigarettes, uncomplicated: Secondary | ICD-10-CM | POA: Diagnosis present

## 2017-03-27 SURGERY — POSTERIOR LUMBAR FUSION 2 LEVEL
Anesthesia: General | Site: Back

## 2017-03-27 MED ORDER — CHLORHEXIDINE GLUCONATE CLOTH 2 % EX PADS: 6.0000 | MEDICATED_PAD | Freq: Once | CUTANEOUS | Status: DC

## 2017-03-27 MED ORDER — DEXAMETHASONE SODIUM PHOSPHATE 10 MG/ML IJ SOLN
INTRAMUSCULAR | Status: AC
  Filled 2017-03-27: qty 1

## 2017-03-27 MED ORDER — POTASSIUM CHLORIDE IN NACL 20-0.9 MEQ/L-% IV SOLN: INTRAVENOUS | Status: DC

## 2017-03-27 MED ORDER — METHOCARBAMOL 1000 MG/10ML IJ SOLN
500.0000 mg | Freq: Four times a day (QID) | INTRAMUSCULAR | Status: DC | PRN
  Filled 2017-03-27: qty 5

## 2017-03-27 MED ORDER — VENLAFAXINE HCL ER 37.5 MG PO CP24
37.5000 mg | ORAL_CAPSULE | Freq: Every day | ORAL | Status: DC
  Administered 2017-03-28 – 2017-03-29 (×2): 37.5 mg via ORAL
  Filled 2017-03-27 (×3): qty 1

## 2017-03-27 MED ORDER — THROMBIN (RECOMBINANT) 5000 UNITS EX SOLR
CUTANEOUS | Status: AC
  Filled 2017-03-27: qty 5000

## 2017-03-27 MED ORDER — TAMSULOSIN HCL 0.4 MG PO CAPS
0.4000 mg | ORAL_CAPSULE | Freq: Every day | ORAL | Status: DC
  Administered 2017-03-27 – 2017-03-28 (×2): 0.4 mg via ORAL
  Filled 2017-03-27 (×2): qty 1

## 2017-03-27 MED ORDER — SUGAMMADEX SODIUM 200 MG/2ML IV SOLN
INTRAVENOUS | Status: AC
  Filled 2017-03-27: qty 2

## 2017-03-27 MED ORDER — MIDAZOLAM HCL 5 MG/5ML IJ SOLN
INTRAMUSCULAR | Status: DC | PRN
  Administered 2017-03-27 (×2): 1 mg via INTRAVENOUS

## 2017-03-27 MED ORDER — ROCURONIUM BROMIDE 100 MG/10ML IV SOLN
INTRAVENOUS | Status: DC | PRN
  Administered 2017-03-27: 20 mg via INTRAVENOUS
  Administered 2017-03-27 (×2): 10 mg via INTRAVENOUS
  Administered 2017-03-27: 50 mg via INTRAVENOUS

## 2017-03-27 MED ORDER — OXYCODONE HCL 5 MG PO TABS
10.0000 mg | ORAL_TABLET | ORAL | Status: DC | PRN
  Administered 2017-03-27 – 2017-03-28 (×6): 10 mg via ORAL
  Filled 2017-03-27 (×5): qty 2

## 2017-03-27 MED ORDER — MENTHOL 3 MG MT LOZG: 1.0000 | LOZENGE | OROMUCOSAL | Status: DC | PRN

## 2017-03-27 MED ORDER — SODIUM CHLORIDE 0.9 % IR SOLN
Status: DC | PRN
  Administered 2017-03-27: 09:00:00

## 2017-03-27 MED ORDER — ACETAMINOPHEN 650 MG RE SUPP: 650.0000 mg | RECTAL | Status: DC | PRN

## 2017-03-27 MED ORDER — DEXAMETHASONE SODIUM PHOSPHATE 10 MG/ML IJ SOLN
10.0000 mg | INTRAMUSCULAR | Status: AC
  Administered 2017-03-27: 10 mg via INTRAVENOUS

## 2017-03-27 MED ORDER — VANCOMYCIN HCL 1000 MG IV SOLR
INTRAVENOUS | Status: AC
  Filled 2017-03-27: qty 1000

## 2017-03-27 MED ORDER — ONDANSETRON HCL 4 MG/2ML IJ SOLN
INTRAMUSCULAR | Status: DC | PRN
  Administered 2017-03-27: 4 mg via INTRAVENOUS

## 2017-03-27 MED ORDER — MIDAZOLAM HCL 2 MG/2ML IJ SOLN
INTRAMUSCULAR | Status: AC
  Filled 2017-03-27: qty 2

## 2017-03-27 MED ORDER — ACETAMINOPHEN 10 MG/ML IV SOLN
INTRAVENOUS | Status: AC
  Administered 2017-03-27: 1000 mg via INTRAVENOUS
  Filled 2017-03-27: qty 100

## 2017-03-27 MED ORDER — BUPIVACAINE HCL (PF) 0.25 % IJ SOLN
INTRAMUSCULAR | Status: AC
  Filled 2017-03-27: qty 30

## 2017-03-27 MED ORDER — KETOROLAC TROMETHAMINE 15 MG/ML IJ SOLN
15.0000 mg | Freq: Four times a day (QID) | INTRAMUSCULAR | Status: AC
  Administered 2017-03-27 – 2017-03-28 (×4): 15 mg via INTRAVENOUS
  Filled 2017-03-27 (×3): qty 1

## 2017-03-27 MED ORDER — ONDANSETRON HCL 4 MG/2ML IJ SOLN: 4.0000 mg | Freq: Four times a day (QID) | INTRAMUSCULAR | Status: DC | PRN

## 2017-03-27 MED ORDER — PHENYLEPHRINE HCL 10 MG/ML IJ SOLN
INTRAVENOUS | Status: DC | PRN
  Administered 2017-03-27: 10 ug/min via INTRAVENOUS

## 2017-03-27 MED ORDER — FENTANYL CITRATE (PF) 100 MCG/2ML IJ SOLN
25.0000 ug | INTRAMUSCULAR | Status: DC | PRN
  Administered 2017-03-27 (×3): 50 ug via INTRAVENOUS

## 2017-03-27 MED ORDER — HEMOSTATIC AGENTS (NO CHARGE) OPTIME
TOPICAL | Status: DC | PRN
  Administered 2017-03-27 (×2): 1 via TOPICAL

## 2017-03-27 MED ORDER — METHOCARBAMOL 500 MG PO TABS
500.0000 mg | ORAL_TABLET | Freq: Four times a day (QID) | ORAL | Status: DC | PRN
  Administered 2017-03-27 – 2017-03-29 (×8): 500 mg via ORAL
  Filled 2017-03-27 (×8): qty 1

## 2017-03-27 MED ORDER — KETOROLAC TROMETHAMINE 15 MG/ML IJ SOLN
INTRAMUSCULAR | Status: AC
  Filled 2017-03-27: qty 1

## 2017-03-27 MED ORDER — LISDEXAMFETAMINE DIMESYLATE 20 MG PO CAPS
100.0000 mg | ORAL_CAPSULE | Freq: Every day | ORAL | Status: DC
  Filled 2017-03-27: qty 5

## 2017-03-27 MED ORDER — ROCURONIUM BROMIDE 10 MG/ML (PF) SYRINGE
PREFILLED_SYRINGE | INTRAVENOUS | Status: AC
  Filled 2017-03-27: qty 5

## 2017-03-27 MED ORDER — FENTANYL CITRATE (PF) 250 MCG/5ML IJ SOLN
INTRAMUSCULAR | Status: AC
  Filled 2017-03-27: qty 5

## 2017-03-27 MED ORDER — LACTATED RINGERS IV SOLN
INTRAVENOUS | Status: DC | PRN
  Administered 2017-03-27 (×2): via INTRAVENOUS

## 2017-03-27 MED ORDER — VANCOMYCIN HCL 1000 MG IV SOLR
INTRAVENOUS | Status: DC | PRN
  Administered 2017-03-27: 1000 mg

## 2017-03-27 MED ORDER — MORPHINE SULFATE (PF) 4 MG/ML IV SOLN
2.0000 mg | INTRAVENOUS | Status: DC | PRN
  Administered 2017-03-28 (×2): 2 mg via INTRAVENOUS
  Filled 2017-03-27 (×2): qty 1

## 2017-03-27 MED ORDER — VANCOMYCIN HCL IN DEXTROSE 1-5 GM/200ML-% IV SOLN
1000.0000 mg | INTRAVENOUS | Status: AC
  Administered 2017-03-27: 1000 mg via INTRAVENOUS

## 2017-03-27 MED ORDER — LIDOCAINE HCL (CARDIAC) 20 MG/ML IV SOLN
INTRAVENOUS | Status: DC | PRN
  Administered 2017-03-27: 60 mg via INTRAVENOUS

## 2017-03-27 MED ORDER — FENTANYL CITRATE (PF) 100 MCG/2ML IJ SOLN
INTRAMUSCULAR | Status: DC | PRN
  Administered 2017-03-27 (×3): 50 ug via INTRAVENOUS
  Administered 2017-03-27: 150 ug via INTRAVENOUS
  Administered 2017-03-27: 50 ug via INTRAVENOUS

## 2017-03-27 MED ORDER — MIDAZOLAM HCL 2 MG/2ML IJ SOLN
INTRAMUSCULAR | Status: AC
  Administered 2017-03-27: 1 mg
  Filled 2017-03-27: qty 2

## 2017-03-27 MED ORDER — ACETAMINOPHEN 325 MG PO TABS
650.0000 mg | ORAL_TABLET | ORAL | Status: DC | PRN
  Administered 2017-03-28 – 2017-03-29 (×3): 650 mg via ORAL
  Filled 2017-03-27 (×3): qty 2

## 2017-03-27 MED ORDER — FENTANYL CITRATE (PF) 100 MCG/2ML IJ SOLN
INTRAMUSCULAR | Status: AC
Start: 2017-03-27 — End: 2017-03-28
  Filled 2017-03-27: qty 2

## 2017-03-27 MED ORDER — BUPROPION HCL ER (XL) 300 MG PO TB24
300.0000 mg | ORAL_TABLET | Freq: Every evening | ORAL | Status: DC
  Administered 2017-03-27 – 2017-03-29 (×2): 300 mg via ORAL
  Filled 2017-03-27 (×2): qty 1

## 2017-03-27 MED ORDER — PROPOFOL 10 MG/ML IV BOLUS
INTRAVENOUS | Status: AC
  Filled 2017-03-27: qty 40

## 2017-03-27 MED ORDER — ACETAMINOPHEN 10 MG/ML IV SOLN
1000.0000 mg | Freq: Once | INTRAVENOUS | Status: AC
  Administered 2017-03-27: 1000 mg via INTRAVENOUS

## 2017-03-27 MED ORDER — ONDANSETRON HCL 4 MG/2ML IJ SOLN
INTRAMUSCULAR | Status: AC
  Filled 2017-03-27: qty 2

## 2017-03-27 MED ORDER — PHENOL 1.4 % MT LIQD: 1.0000 | OROMUCOSAL | Status: DC | PRN

## 2017-03-27 MED ORDER — VANCOMYCIN HCL IN DEXTROSE 1-5 GM/200ML-% IV SOLN
INTRAVENOUS | Status: AC
  Filled 2017-03-27: qty 200

## 2017-03-27 MED ORDER — SENNA 8.6 MG PO TABS
1.0000 | ORAL_TABLET | Freq: Two times a day (BID) | ORAL | Status: DC
  Administered 2017-03-27 – 2017-03-29 (×4): 8.6 mg via ORAL
  Filled 2017-03-27 (×4): qty 1

## 2017-03-27 MED ORDER — THROMBIN (RECOMBINANT) 20000 UNITS EX SOLR
CUTANEOUS | Status: AC
  Filled 2017-03-27: qty 20000

## 2017-03-27 MED ORDER — THROMBIN (RECOMBINANT) 5000 UNITS EX SOLR
CUTANEOUS | Status: DC | PRN
  Administered 2017-03-27: 5000 [IU] via TOPICAL

## 2017-03-27 MED ORDER — 0.9 % SODIUM CHLORIDE (POUR BTL) OPTIME
TOPICAL | Status: DC | PRN
  Administered 2017-03-27: 1000 mL

## 2017-03-27 MED ORDER — ONDANSETRON HCL 4 MG/2ML IJ SOLN: 4.0000 mg | Freq: Once | INTRAMUSCULAR | Status: DC | PRN

## 2017-03-27 MED ORDER — ARTIFICIAL TEARS OPHTHALMIC OINT
TOPICAL_OINTMENT | OPHTHALMIC | Status: DC | PRN
  Administered 2017-03-27: 1 via OPHTHALMIC

## 2017-03-27 MED ORDER — SODIUM CHLORIDE 0.9% FLUSH: 3.0000 mL | INTRAVENOUS | Status: DC | PRN

## 2017-03-27 MED ORDER — VANCOMYCIN HCL 10 G IV SOLR
1250.0000 mg | INTRAVENOUS | Status: AC
  Administered 2017-03-27: 1250 mg via INTRAVENOUS
  Filled 2017-03-27: qty 1250

## 2017-03-27 MED ORDER — PROPOFOL 10 MG/ML IV BOLUS
INTRAVENOUS | Status: DC | PRN
  Administered 2017-03-27: 200 mg via INTRAVENOUS

## 2017-03-27 MED ORDER — LIDOCAINE 2% (20 MG/ML) 5 ML SYRINGE
INTRAMUSCULAR | Status: AC
  Filled 2017-03-27: qty 5

## 2017-03-27 MED ORDER — OXYCODONE HCL 5 MG PO TABS
ORAL_TABLET | ORAL | Status: AC
  Administered 2017-03-27: 10 mg via ORAL
  Filled 2017-03-27: qty 2

## 2017-03-27 MED ORDER — BUPIVACAINE HCL (PF) 0.25 % IJ SOLN
INTRAMUSCULAR | Status: DC | PRN
  Administered 2017-03-27: 4 mL

## 2017-03-27 MED ORDER — MIDAZOLAM HCL 2 MG/2ML IJ SOLN
1.0000 mg | Freq: Once | INTRAMUSCULAR | Status: AC
Start: 2017-03-27 — End: 2017-03-27
  Administered 2017-03-27: 1 mg via INTRAVENOUS

## 2017-03-27 MED ORDER — SODIUM CHLORIDE 0.9% FLUSH
3.0000 mL | Freq: Two times a day (BID) | INTRAVENOUS | Status: DC
  Administered 2017-03-27 – 2017-03-28 (×3): 3 mL via INTRAVENOUS

## 2017-03-27 MED ORDER — ONDANSETRON HCL 4 MG PO TABS: 4.0000 mg | ORAL_TABLET | Freq: Four times a day (QID) | ORAL | Status: DC | PRN

## 2017-03-27 MED ORDER — FENTANYL CITRATE (PF) 100 MCG/2ML IJ SOLN
INTRAMUSCULAR | Status: AC
  Filled 2017-03-27: qty 2

## 2017-03-27 SURGICAL SUPPLY — 65 items
ATEC PORO TIPS 10D 9W 25X9X11 (Bone Implant) ×8 IMPLANT
BAG DECANTER FOR FLEXI CONT (MISCELLANEOUS) ×2 IMPLANT
BASKET BONE COLLECTION (BASKET) ×2 IMPLANT
BENZOIN TINCTURE PRP APPL 2/3 (GAUZE/BANDAGES/DRESSINGS) ×2 IMPLANT
BLADE CLIPPER SURG (BLADE) IMPLANT
BUR MATCHSTICK NEURO 3.0 LAGG (BURR) ×2 IMPLANT
CANISTER SUCT 3000ML PPV (MISCELLANEOUS) ×2 IMPLANT
CARTRIDGE OIL MAESTRO DRILL (MISCELLANEOUS) ×1 IMPLANT
CONT SPEC 4OZ CLIKSEAL STRL BL (MISCELLANEOUS) ×2 IMPLANT
COVER BACK TABLE 60X90IN (DRAPES) ×2 IMPLANT
DERMABOND ADVANCED (GAUZE/BANDAGES/DRESSINGS) ×1
DERMABOND ADVANCED .7 DNX12 (GAUZE/BANDAGES/DRESSINGS) ×1 IMPLANT
DIFFUSER DRILL AIR PNEUMATIC (MISCELLANEOUS) ×2 IMPLANT
DRAPE C-ARM 42X72 X-RAY (DRAPES) ×2 IMPLANT
DRAPE C-ARMOR (DRAPES) ×2 IMPLANT
DRAPE LAPAROTOMY 100X72X124 (DRAPES) ×2 IMPLANT
DRAPE POUCH INSTRU U-SHP 10X18 (DRAPES) ×2 IMPLANT
DRAPE SURG 17X23 STRL (DRAPES) ×2 IMPLANT
DRSG OPSITE POSTOP 4X6 (GAUZE/BANDAGES/DRESSINGS) ×2 IMPLANT
DURAPREP 26ML APPLICATOR (WOUND CARE) ×2 IMPLANT
ELECT REM PT RETURN 9FT ADLT (ELECTROSURGICAL) ×2
ELECTRODE REM PT RTRN 9FT ADLT (ELECTROSURGICAL) ×1 IMPLANT
EVACUATOR 1/8 PVC DRAIN (DRAIN) IMPLANT
GAUZE SPONGE 4X4 16PLY XRAY LF (GAUZE/BANDAGES/DRESSINGS) IMPLANT
GLOVE BIO SURGEON STRL SZ7 (GLOVE) ×2 IMPLANT
GLOVE BIO SURGEON STRL SZ8 (GLOVE) ×4 IMPLANT
GLOVE BIOGEL PI IND STRL 7.0 (GLOVE) ×1 IMPLANT
GLOVE BIOGEL PI IND STRL 7.5 (GLOVE) ×1 IMPLANT
GLOVE BIOGEL PI INDICATOR 7.0 (GLOVE) ×1
GLOVE BIOGEL PI INDICATOR 7.5 (GLOVE) ×1
GLOVE SS BIOGEL STRL SZ 7 (GLOVE) ×1 IMPLANT
GLOVE SUPERSENSE BIOGEL SZ 7 (GLOVE) ×1
GOWN STRL REUS W/ TWL LRG LVL3 (GOWN DISPOSABLE) ×2 IMPLANT
GOWN STRL REUS W/ TWL XL LVL3 (GOWN DISPOSABLE) ×2 IMPLANT
GOWN STRL REUS W/TWL 2XL LVL3 (GOWN DISPOSABLE) IMPLANT
GOWN STRL REUS W/TWL LRG LVL3 (GOWN DISPOSABLE) ×2
GOWN STRL REUS W/TWL XL LVL3 (GOWN DISPOSABLE) ×2
HEMOSTAT POWDER KIT SURGIFOAM (HEMOSTASIS) ×2 IMPLANT
KIT BASIN OR (CUSTOM PROCEDURE TRAY) ×2 IMPLANT
KIT ROOM TURNOVER OR (KITS) ×2 IMPLANT
MATRIX STRIP NEOCORE 12C (Putty) ×1 IMPLANT
MILL MEDIUM DISP (BLADE) ×2 IMPLANT
NEEDLE HYPO 25X1 1.5 SAFETY (NEEDLE) ×2 IMPLANT
NS IRRIG 1000ML POUR BTL (IV SOLUTION) ×2 IMPLANT
OIL CARTRIDGE MAESTRO DRILL (MISCELLANEOUS) ×2
PACK LAMINECTOMY NEURO (CUSTOM PROCEDURE TRAY) ×2 IMPLANT
PAD ARMBOARD 7.5X6 YLW CONV (MISCELLANEOUS) ×6 IMPLANT
ROD PC 5.5X60 TI ARSENAL (Rod) ×4 IMPLANT
SCREW CBX 5.0X40MM (Screw) ×2 IMPLANT
SCREW CBX 6.5X35MM (Screw) ×4 IMPLANT
SCREW CORT CBX 5.5X40 (Screw) ×6 IMPLANT
SCREW SET SPINAL ARSENAL 47127 (Screw) ×12 IMPLANT
SPONGE LAP 4X18 X RAY DECT (DISPOSABLE) IMPLANT
SPONGE SURGIFOAM ABS GEL 100 (HEMOSTASIS) ×2 IMPLANT
STRIP CLOSURE SKIN 1/2X4 (GAUZE/BANDAGES/DRESSINGS) ×2 IMPLANT
STRIP MATRIX NEOCORE 12CC (Putty) ×1 IMPLANT
SUT VIC AB 0 CT1 18XCR BRD8 (SUTURE) ×1 IMPLANT
SUT VIC AB 0 CT1 8-18 (SUTURE) ×1
SUT VIC AB 2-0 CP2 18 (SUTURE) ×2 IMPLANT
SUT VIC AB 3-0 SH 8-18 (SUTURE) ×4 IMPLANT
SYR CONTROL 10ML LL (SYRINGE) ×2 IMPLANT
TOWEL GREEN STERILE (TOWEL DISPOSABLE) ×2 IMPLANT
TOWEL GREEN STERILE FF (TOWEL DISPOSABLE) ×2 IMPLANT
TRAY FOLEY W/METER SILVER 16FR (SET/KITS/TRAYS/PACK) ×2 IMPLANT
WATER STERILE IRR 1000ML POUR (IV SOLUTION) ×2 IMPLANT

## 2017-03-27 NOTE — Transfer of Care (Signed)
Immediate Anesthesia Transfer of Care Note  Patient: Monique Castro  Procedure(s) Performed: Posterior Lumbar nterbody Fusion - Lumbar four-five  Lumbar five Sacral one (N/A Back)  Patient Location: PACU  Anesthesia Type:General  Level of Consciousness: drowsy  Airway & Oxygen Therapy: Patient Spontanous Breathing and Patient connected to nasal cannula oxygen  Post-op Assessment: Report given to RN and Post -op Vital signs reviewed and stable  Post vital signs: Reviewed and stable  Last Vitals:  Vitals:   03/27/17 0658  BP: (!) 137/93  Pulse: 99  Resp: 20  Temp: 37.1 C    Last Pain:  Vitals:   03/27/17 0715  TempSrc:   PainSc: 7          Complications: No apparent anesthesia complications

## 2017-03-27 NOTE — Anesthesia Procedure Notes (Signed)
Procedure Name: Intubation Date/Time: 03/27/2017 8:40 AM Performed by: Eligha Bridegroom, CRNA Pre-anesthesia Checklist: Patient identified, Emergency Drugs available, Suction available and Patient being monitored Patient Re-evaluated:Patient Re-evaluated prior to induction Oxygen Delivery Method: Circle System Utilized Preoxygenation: Pre-oxygenation with 100% oxygen Induction Type: IV induction Ventilation: Mask ventilation without difficulty Laryngoscope Size: Mac and 3 Grade View: Grade II Tube type: Oral Tube size: 7.0 mm Number of attempts: 1 Airway Equipment and Method: Stylet and Oral airway Placement Confirmation: ETT inserted through vocal cords under direct vision,  positive ETCO2 and breath sounds checked- equal and bilateral Secured at: 21 cm Tube secured with: Tape Dental Injury: Teeth and Oropharynx as per pre-operative assessment  Comments: Intubation per Vickii Penna, SRNA.

## 2017-03-27 NOTE — Evaluation (Signed)
Physical Therapy Evaluation Patient Details Name: Monique Castro MRN: [id number] DOB: 03/14/1971 Today's Date: 03/27/2017   History of Present Illness  46 y.o.femaleadmitted for spondylolisthesis now s/p decompressive lumbar laminectomyL4-5 and L5-S1and PLIFL4-5 and L5-S1  Clinical Impression  Patient seen for mobility assessment s/p spinal surgery. Mobilizing well. Educated patient on precautions, mobility expectations, safety and car transfers. No further acute PT needs. Will sign off.     Follow Up Recommendations No PT follow up    Equipment Recommendations  None recommended by PT    Recommendations for Other Services       Precautions / Restrictions Precautions Precautions: Back Precaution Booklet Issued: Yes (comment) Precaution Comments: verbally reviewed with patient and spouse Required Braces or Orthoses: Spinal Brace Spinal Brace: Lumbar corset      Mobility  Bed Mobility Overal bed mobility: Needs Assistance Bed Mobility: Rolling;Sidelying to Sit;Sit to Sidelying Rolling: Supervision Sidelying to sit: Supervision     Sit to sidelying: Supervision General bed mobility comments: Vcs for technique and sequencing, increased time to perform. No physical assist required  Transfers Overall transfer level: Needs assistance Equipment used: None Transfers: Sit to/from Stand Sit to Stand: Supervision         General transfer comment: Supervision for safety, no physical assist required  Ambulation/Gait Ambulation/Gait assistance: Supervision Ambulation Distance (Feet): 180 Feet Assistive device: None Gait Pattern/deviations: Step-through pattern;Decreased stride length;Narrow base of support Gait velocity: decreased Gait velocity interpretation: Below normal speed for age/gender General Gait Details: slow guarded gait at times with modest instability. Improved with VCs for increased cadence  Stairs Stairs: Yes Stairs assistance: Supervision Stair  Management: One rail Right Number of Stairs: 4 General stair comments: VCs for sequencing, no physical assist required  Wheelchair Mobility    Modified Rankin (Stroke Patients Only)       Balance Overall balance assessment: Needs assistance   Sitting balance-Leahy Scale: Good       Standing balance-Leahy Scale: Fair Standing balance comment: able to static stand without difficulty, guarded                             Pertinent Vitals/Pain Pain Assessment: Faces Faces Pain Scale: Hurts even more Pain Location: back Pain Descriptors / Indicators: Operative site guarding Pain Intervention(s): Monitored during session    Home Living Family/patient expects to be discharged to:: Private residence Living Arrangements: Spouse/significant other Available Help at Discharge: Family Type of Home: House Home Access: Stairs to enter Entrance Stairs-Rails: None Secretary/administratorntrance Stairs-Number of Steps: 3 Home Layout: Two level;Bed/bath upstairs Home Equipment: None      Prior Function Level of Independence: Independent               Hand Dominance   Dominant Hand: Right    Extremity/Trunk Assessment   Upper Extremity Assessment Upper Extremity Assessment: Overall WFL for tasks assessed    Lower Extremity Assessment Lower Extremity Assessment: Overall WFL for tasks assessed    Cervical / Trunk Assessment Cervical / Trunk Assessment: (s/p spinal surgery)  Communication   Communication: No difficulties  Cognition Arousal/Alertness: Awake/alert Behavior During Therapy: WFL for tasks assessed/performed Overall Cognitive Status: Within Functional Limits for tasks assessed                                        General Comments General comments (skin integrity, edema, etc.): educated  on car transfers, mobility expectations, sleeping and positional changes    Exercises     Assessment/Plan    PT Assessment Patent does not need any further  PT services  PT Problem List         PT Treatment Interventions      PT Goals (Current goals can be found in the Care Plan section)  Acute Rehab PT Goals PT Goal Formulation: All assessment and education complete, DC therapy    Frequency     Barriers to discharge        Co-evaluation               AM-PAC PT "6 Clicks" Daily Activity  Outcome Measure Difficulty turning over in bed (including adjusting bedclothes, sheets and blankets)?: A Little Difficulty moving from lying on back to sitting on the side of the bed? : A Little Difficulty sitting down on and standing up from a chair with arms (e.g., wheelchair, bedside commode, etc,.)?: A Little Help needed moving to and from a bed to chair (including a wheelchair)?: A Little Help needed walking in hospital room?: A Little Help needed climbing 3-5 steps with a railing? : A Little 6 Click Score: 18    End of Session Equipment Utilized During Treatment: Back brace Activity Tolerance: Patient tolerated treatment well Patient left: in bed;with call bell/phone within reach;with family/visitor present Nurse Communication: Mobility status PT Visit Diagnosis: Difficulty in walking, not elsewhere classified (R26.2)    Time: [id number] PT Time Calculation (min) (ACUTE ONLY): 20 min   Charges:   PT Evaluation $PT Eval Low Complexity: 1 Low     PT G Codes:   PT G-Codes **NOT FOR INPATIENT CLASS** Functional Assessment Tool Used: Clinical judgement Functional Limitation: Mobility: Walking and moving around Mobility: Walking and Moving Around Current Status ([id number](G8978): At least 1 percent but less than 20 percent impaired, limited or restricted Mobility: Walking and Moving Around Goal Status [phone](G8979): At least 1 percent but less than 20 percent impaired, limited or restricted Mobility: Walking and Moving Around Discharge Status [phone](G8980): At least 1 percent but less than 20 percent impaired, limited or restricted    Charlotte Crumbevon Makinzey Banes, PT  DPT  Board Certified Neurologic Specialist [phone]   Fabio AsaDevon J Hanifah Royse 03/27/2017, 4:47 PM

## 2017-03-27 NOTE — Op Note (Signed)
03/27/2017  12:38 PM  PATIENT:  Monique Castro  46 y.o. female  PRE-OPERATIVE DIAGNOSIS:  spondyloListhesis L4-5 and L5-S1, back and leg pain  POST-OPERATIVE DIAGNOSIS:  same  PROCEDURE:   1. Decompressive lumbar laminectomy L4-5 and L5-S1 requiring more work than would be required for a simple exposure of the disk for PLIF in order to adequately decompress the neural elements and address the spinal stenosis 2. Posterior lumbar interbody fusion L4-5 and L5-S1 using porous titanium interbody cages packed with morcellized allograft and autograft 3. Posterior fixation L4-S1 inclusive using Alphatec cortical pedicle screws.  4. Intertransverse arthrodesis L4-S1 using morcellized autograft and allograft.  SURGEON:  Marikay Alaravid Keelin Neville, MD  ASSISTANTSAdelene Idler: Meyran FNP  ANESTHESIA:  General  EBL: 150 ml  Total I/O In: 1000 [I.V.:1000] Out: 400 [Urine:250; Blood:150]  BLOOD ADMINISTERED:none  DRAINS: none   INDICATION FOR PROCEDURE: This patient presented with chronic severe back and leg pain. Imaging revealed spondylolisthesis L4-5 and L5-S1 with lateral recess narrowing. The patient tried a reasonable attempt at conservative medical measures without relief. I recommended decompression and instrumented fusion to address the stenosis as well as the segmental  instability.  Patient understood the risks, benefits, and alternatives and potential outcomes and wished to proceed.  PROCEDURE DETAILS:  The patient was brought to the operating room. After induction of generalized endotracheal anesthesia the patient was rolled into the prone position on chest rolls and all pressure points were padded. The patient's lumbar region was cleaned and then prepped with DuraPrep and draped in the usual sterile fashion. Anesthesia was injected and then a dorsal midline incision was made and carried down to the lumbosacral fascia. The fascia was opened and the paraspinous musculature was taken down in a subperiosteal  fashion to expose L4-5 and L5-S1. A self-retaining retractor was placed. Intraoperative fluoroscopy confirmed my level, and I started with placement of the L4 cortical pedicle screws. The pedicle screw entry zones were identified utilizing surface landmarks and  AP and lateral fluoroscopy. I scored the cortex with the high-speed drill and then used the hand drill to drill an upward and outward direction into the pedicle. I then tapped line to line. I then placed a 5.5 x 40 mm cortical pedicle screw into the pedicles of L4 bilaterally. I then turned my attention to the decompression and complete lumbar laminectomies, hemi- facetectomies, and foraminotomies were performed at L4-5 and L5-S1. The patient had significant spinal stenosis and this required more work than would be required for a simple exposure of the disc for posterior lumbar interbody fusion which would only require a limited laminotomy. Much more generous decompression and generous foraminotomy was undertaken in order to adequately decompress the neural elements and address the patient's leg pain. The yellow ligament was removed to expose the underlying dura and nerve roots, and generous foraminotomies were performed to adequately decompress the neural elements. Both the exiting and traversing nerve roots were decompressed on both sides until a coronary dilator passed easily along the nerve roots. Once the decompression was complete, I turned my attention to the posterior lower lumbar interbody fusion. The epidural venous vasculature was coagulated and cut sharply. Disc space was incised and the initial discectomy was performed with pituitary rongeurs. The disc space was distracted with sequential distractors to a height of 11 mm at each level. We then used a series of scrapers and shavers to prepare the endplates for fusion. The midline was prepared with Epstein curettes. Once the complete discectomy was finished, we packed an  appropriate sized  interbody cage with local autograft and morcellized allograft, gently retracted the nerve root, and tapped the cage into position at L4-5 and L5-S1.  The midline between the cages was packed with morselized autograft and allograft. We then turned our attention to the placement of the lower pedicle screws. The pedicle screw entry zones were identified utilizing surface landmarks and fluoroscopy. I drilled into each pedicle utilizing the hand drill, and tapped each pedicle with the appropriate tap. We palpated with a ball probe to assure no break in the cortex. We then placed 5.5 x 40 mm cortical pedicle screws into the pedicles bilaterally at L5 and 6.5 by a 40 mm pedicle screws at S1. We then decorticated the transverse processes and laid a mixture of morcellized autograft and allograft out over these to perform intertransverse arthrodesis at L4-S1. We then placed lordotic rods into the multiaxial screw heads of the pedicle screws and locked these in position with the locking caps and anti-torque device. We then checked our construct with AP and lateral fluoroscopy. Irrigated with copious amounts of bacitracin-containing saline solution. Inspected the nerve roots once again to assure adequate decompression, lined to the dura with Gelfoam, placed powdered vancomycin into the wound, and closed the muscle and the fascia with 0 Vicryl. Closed the subcutaneous tissues with 2-0 Vicryl and subcuticular tissues with 3-0 Vicryl. The skin was closed with benzoin and Steri-Strips. Dressing was then applied, the patient was awakened from general anesthesia and transported to the recovery room in stable condition. At the end of the procedure all sponge, needle and instrument counts were correct.   PLAN OF CARE: admit to inpatient  PATIENT DISPOSITION:  PACU - hemodynamically stable.   Delay start of Pharmacological VTE agent (>24hrs) due to surgical blood loss or risk of bleeding:  yes

## 2017-03-27 NOTE — Anesthesia Postprocedure Evaluation (Signed)
Anesthesia Post Note  Patient: Monique Castro  Procedure(s) Performed: Posterior Lumbar nterbody Fusion - Lumbar four-five  Lumbar five Sacral one (N/A Back)     Patient location during evaluation: PACU Anesthesia Type: General Level of consciousness: awake, awake and alert and oriented Pain management: pain level controlled Vital Signs Assessment: post-procedure vital signs reviewed and stable Respiratory status: spontaneous breathing, nonlabored ventilation and respiratory function stable Cardiovascular status: blood pressure returned to baseline Anesthetic complications: no    Last Vitals:  Vitals:   03/27/17 1530 03/27/17 1545  BP: (!) 121/91 104/71  Pulse: (!) 110 (!) 102  Resp: (!) 31 16  Temp:  36.9 C  SpO2: 100% 100%    Last Pain:  Vitals:   03/27/17 1500  TempSrc:   PainSc: 4                  Maddyx Vallie COKER

## 2017-03-27 NOTE — Progress Notes (Signed)
Pharmacy Antibiotic Note  Monique Castro is a 46 y.o. female admitted on 03/27/2017 with spondylolisthesis. Now s/p Decompressive lumbar laminectomy L4-5 and L5 -S1 and Posterior lumbar interbody fusion L4-5 and L5-S1.   Pharmacy has been consulted for Vancomycin dosing for surgical prophylaxis..  Plan: Vancomycin 1250 mg IV q24h x 1 dose at 22:00 tonight.  Then pharmacy will sign off.   Height: 5\' 1"  (154.9 cm) Weight: 118 lb (53.5 kg) IBW/kg (Calculated) : 47.8  Temp (24hrs), Avg:98.3 F (36.8 C), Min:98.1 F (36.7 C), Max:98.7 F (37.1 C)  Recent Labs  Lab 03/22/17 1430  WBC 11.4*  CREATININE 0.75    Estimated Creatinine Clearance: 66.3 mL/min (by C-G formula based on SCr of 0.75 mg/dL).    Allergies  Allergen Reactions  . Sulfa Antibiotics Rash  . Amoxicillin-Pot Clavulanate Diarrhea    Has patient had a PCN reaction causing immediate rash, facial/tongue/throat swelling, SOB or lightheadedness with hypotension: No Has patient had a PCN reaction causing severe rash involving mucus membranes or skin necrosis: No Has patient had a PCN reaction that required hospitalization: No Has patient had a PCN reaction occurring within the last 10 years: #  #  #  YES  #  #  #  If all of the above answers are "NO", then may proceed with Cephalosporin use.   Marland Kitchen. Doxycycline Nausea And Vomiting    GI Upset  . Macrobid [Nitrofurantoin Macrocrystal] Nausea Only     Thank you for allowing pharmacy to be a part of this patient's care. Noah Delaineuth Cardarius Senat, RPh Clinical Pharmacist 8am -330pm # [phone] 330p-1030p phone [phone] or [phone] Main pharmacy [phone]  03/27/2017 4:03 PM

## 2017-03-27 NOTE — Anesthesia Preprocedure Evaluation (Signed)
Anesthesia Evaluation  Patient identified by MRN, date of birth, ID band Patient awake    Reviewed: Allergy & Precautions, NPO status , Patient's Chart, lab work & pertinent test results  Airway Mallampati: II  TM Distance: >3 FB Neck ROM: Full    Dental  (+) Teeth Intact, Dental Advisory Given   Pulmonary Current Smoker,    breath sounds clear to auscultation       Cardiovascular  Rhythm:Regular Rate:Normal     Neuro/Psych    GI/Hepatic   Endo/Other    Renal/GU      Musculoskeletal   Abdominal   Peds  Hematology   Anesthesia Other Findings   Reproductive/Obstetrics                             Anesthesia Physical Anesthesia Plan  ASA: II  Anesthesia Plan: General   Post-op Pain Management:    Induction: Intravenous  PONV Risk Score and Plan: 1 and Ondansetron and Dexamethasone  Airway Management Planned: Oral ETT  Additional Equipment:   Intra-op Plan:   Post-operative Plan:   Informed Consent: I have reviewed the patients History and Physical, chart, labs and discussed the procedure including the risks, benefits and alternatives for the proposed anesthesia with the patient or authorized representative who has indicated his/her understanding and acceptance.   Dental advisory given  Plan Discussed with: CRNA and Anesthesiologist  Anesthesia Plan Comments:         Anesthesia Quick Evaluation

## 2017-03-27 NOTE — H&P (Signed)
Subjective: Patient is a 46 y.o. female admitted for spondylolisthesis. Onset of symptoms was several months ago, gradually worsening since that time.  The pain is rated severe, and is located at the across the lower back and radiates to legs. The pain is described as aching and occurs all day. The symptoms have been progressive. Symptoms are exacerbated by exercise. MRI or CT showed spondylolisthesis L4-S1   Past Medical History:  Diagnosis Date  . Allergy   . Anxiety   . Arthritis   . Depression   . Dysrhythmia    diagnosed with tachycardia in but not severe enough to require medications-per patient  . Fibromyalgia   . GERD (gastroesophageal reflux disease)   . Neuromuscular disorder (HCC)   . Strep throat     Past Surgical History:  Procedure Laterality Date  . ABDOMINAL HYSTERECTOMY    . APPENDECTOMY    . BACK SURGERY     ACDF 2011  . TUBAL LIGATION      Prior to Admission medications   Medication Sig Start Date End Date Taking? Authorizing Provider  buPROPion (WELLBUTRIN XL) 300 MG 24 hr tablet Take 300 mg every evening by mouth.  01/12/15 03/20/17 Yes [provider]  cetirizine-pseudoephedrine (ZYRTEC-D ALLERGY & CONGESTION) 5-120 MG tablet Take 1 tablet daily as needed by mouth for allergies. Reported on 11/30/2015 10/07/07  Yes [provider]  desvenlafaxine (PRISTIQ) 50 MG 24 hr tablet Take 50 mg every evening by mouth.    Yes [provider]  lisdexamfetamine (VYVANSE) 50 MG capsule Take 100 mg daily by mouth.   Yes [provider]  Oxycodone HCl 20 MG TABS Take 20 mg 5 (five) times daily as needed by mouth (pain).   Yes [provider]  pantoprazole (PROTONIX) 40 MG tablet Take 40 mg every evening by mouth. Reported on 08/09/2015 10/18/14  Yes [provider]  simvastatin (ZOCOR) 10 MG tablet Take 10 mg every evening by mouth.   Yes [provider]  tamsulosin (FLOMAX) 0.4 MG CAPS capsule Take 0.4 mg at bedtime by  mouth.   Yes [provider]  tiZANidine (ZANAFLEX) 4 MG tablet Limit one half to one tab by mouth per day or 2-3 times per day if tolerated Patient taking differently: Take 4 mg at bedtime by mouth.  12/29/15  Yes Ewing Schleinrisp, Gregory, MD  diclofenac sodium (VOLTAREN) 1 % GEL Apply 2 g daily as needed topically (pain).    [provider]  oxymorphone (OPANA) 10 MG tablet Limit 1 tab by mouth 3 - 5 times per day if tolerated Patient not taking: Reported on 03/20/2017 12/29/15   Ewing Schleinrisp, Gregory, MD  triamcinolone (NASACORT) 55 MCG/ACT AERO nasal inhaler Place 1 spray daily as needed into the nose (allergies). Reported on 07/11/2015    [provider]   Allergies  Allergen Reactions  . Sulfa Antibiotics Rash  . Amoxicillin-Pot Clavulanate Diarrhea    Has patient had a PCN reaction causing immediate rash, facial/tongue/throat swelling, SOB or lightheadedness with hypotension: No Has patient had a PCN reaction causing severe rash involving mucus membranes or skin necrosis: No Has patient had a PCN reaction that required hospitalization: No Has patient had a PCN reaction occurring within the last 10 years: #  #  #  YES  #  #  #  If all of the above answers are "NO", then may proceed with Cephalosporin use.   Marland Kitchen. Doxycycline Nausea And Vomiting    GI Upset  . Macrobid Dynegy[Nitrofurantoin  Macrocrystal] Nausea Only    Social History   Tobacco Use  . Smoking status: Current Every Day Smoker    Packs/day: 0.50    Types: Cigarettes  . Smokeless tobacco: Never Used  Substance Use Topics  . Alcohol use: Yes    Alcohol/week: 0.0 oz    Comment: rarely    Family History  Problem Relation Age of Onset  . Alcohol abuse Father      Review of Systems  Positive ROS: neg  All other systems have been reviewed and were otherwise negative with the exception of those mentioned in the HPI and as above.  Objective: Vital signs in last 24 hours: Temp:  [98.7 F (37.1 C)] 98.7 F (37.1  C) (11/14 0658) Pulse Rate:  [99] 99 (11/14 0658) Resp:  [20] 20 (11/14 0658) BP: (137)/(93) 137/93 (11/14 0658) Weight:  [53.5 kg (118 lb)] 53.5 kg (118 lb) (11/14 0715)  General Appearance: Alert, cooperative, no distress, appears stated age Head: Normocephalic, without obvious abnormality, atraumatic Eyes: PERRL, conjunctiva/corneas clear, EOM's intact    Neck: Supple, symmetrical, trachea midline Back: Symmetric, no curvature, ROM normal, no CVA tenderness Lungs:  respirations unlabored Heart: Regular rate and rhythm Abdomen: Soft, non-tender Extremities: Extremities normal, atraumatic, no cyanosis or edema Pulses: 2+ and symmetric all extremities Skin: Skin color, texture, turgor normal, no rashes or lesions  NEUROLOGIC:   Mental status: Alert and oriented x4,  no aphasia, good attention span, fund of knowledge, and memory Motor Exam - grossly normal Sensory Exam - grossly normal Reflexes: 1+ Coordination - grossly normal Gait - grossly normal Balance - grossly normal Cranial Nerves: I: smell Not tested  II: visual acuity  OS: nl    OD: nl  II: visual fields Full to confrontation  II: pupils Equal, round, reactive to light  III,VII: ptosis None  III,IV,VI: extraocular muscles  Full ROM  V: mastication Normal  V: facial light touch sensation  Normal  V,VII: corneal reflex  Present  VII: facial muscle function - upper  Normal  VII: facial muscle function - lower Normal  VIII: hearing Not tested  IX: soft palate elevation  Normal  IX,X: gag reflex Present  XI: trapezius strength  5/5  XI: sternocleidomastoid strength 5/5  XI: neck flexion strength  5/5  XII: tongue strength  Normal    Data Review Lab Results  Component Value Date   WBC 11.4 (H) 03/22/2017   HGB 13.4 03/22/2017   HCT 40.2 03/22/2017   MCV 94.4 03/22/2017   PLT 363 03/22/2017   Lab Results  Component Value Date   NA 136 03/22/2017   K 3.6 03/22/2017   CL 105 03/22/2017   CO2 23  03/22/2017   BUN 8 03/22/2017   CREATININE 0.75 03/22/2017   GLUCOSE 109 (H) 03/22/2017   Lab Results  Component Value Date   INR 0.88 03/22/2017    Assessment/Plan: Patient admitted for PLIF L4-5 L5-S1. Patient has failed a reasonable attempt at conservative therapy.  I explained the condition and procedure to the patient and answered any questions.  Patient wishes to proceed with procedure as planned. Understands risks/ benefits and typical outcomes of procedure.   Verner Kopischke S 03/27/2017 7:26 AM

## 2017-03-28 MED ORDER — OXYCODONE HCL 5 MG PO TABS
20.0000 mg | ORAL_TABLET | ORAL | Status: DC | PRN
  Administered 2017-03-28 – 2017-03-29 (×8): 20 mg via ORAL
  Filled 2017-03-28 (×8): qty 4

## 2017-03-28 MED ORDER — MELOXICAM 7.5 MG PO TABS
15.0000 mg | ORAL_TABLET | Freq: Every day | ORAL | Status: DC
  Administered 2017-03-28 – 2017-03-29 (×2): 15 mg via ORAL
  Filled 2017-03-28 (×2): qty 2

## 2017-03-28 NOTE — Progress Notes (Signed)
Patient ID: Monique Castro, female   DOB: February 15, 1971, 46 y.o.   MRN: [id number] Subjective: Patient reports back soreness no leg pain or NTW  Objective: Vital signs in last 24 hours: Temp:  [98.1 F (36.7 C)-99.9 F (37.7 C)] 99.9 F (37.7 C) (11/15 0733) Pulse Rate:  [102-113] 103 (11/15 0733) Resp:  [13-39] 18 (11/15 0733) BP: (88-121)/(56-91) 93/56 (11/15 0733) SpO2:  [99 %-100 %] 99 % (11/15 0733)  Intake/Output from previous day: 11/14 0701 - 11/15 0700 In: 1500 [I.V.:1500] Out: 2050 [Urine:1900; Blood:150] Intake/Output this shift: No intake/output data recorded.  Neurologic: Grossly normal  Lab Results: Lab Results  Component Value Date   WBC 11.4 (H) 03/22/2017   HGB 13.4 03/22/2017   HCT 40.2 03/22/2017   MCV 94.4 03/22/2017   PLT 363 03/22/2017   Lab Results  Component Value Date   INR 0.88 03/22/2017   BMET Lab Results  Component Value Date   NA 136 03/22/2017   K 3.6 03/22/2017   CL 105 03/22/2017   CO2 23 03/22/2017   GLUCOSE 109 (H) 03/22/2017   BUN 8 03/22/2017   CREATININE 0.75 03/22/2017   CALCIUM 9.0 03/22/2017    Studies/Results: Dg Lumbar Spine 2-3 Views  Result Date: 03/27/2017 CLINICAL DATA:  Posterior lumbar interbody fusion L4-S1 EXAM: DG C-ARM 61-120 MIN; LUMBAR SPINE - 2-3 VIEW COMPARISON:  None FLUOROSCOPY TIME:  1 minutes 40 seconds FINDINGS: Three intraoperative fluoroscopic spot images demonstrate posterior lumbar interbody fusion from L4 through S1 with interbody cages in satisfactory position. IMPRESSION: Posterior lumbar interbody fusion at L4-S1. Electronically Signed   By: Elige KoHetal  Patel   On: 03/27/2017 13:19   Dg C-arm 61-120 Min  Result Date: 03/27/2017 CLINICAL DATA:  Posterior lumbar interbody fusion L4-S1 EXAM: DG C-ARM 61-120 MIN; LUMBAR SPINE - 2-3 VIEW COMPARISON:  None FLUOROSCOPY TIME:  1 minutes 40 seconds FINDINGS: Three intraoperative fluoroscopic spot images demonstrate posterior lumbar interbody fusion from L4  through S1 with interbody cages in satisfactory position. IMPRESSION: Posterior lumbar interbody fusion at L4-S1. Electronically Signed   By: Elige KoHetal  Patel   On: 03/27/2017 13:19    Assessment/Plan: Doing relatively well, quite sore but only on half the pain medicine she was taking pre-op. Mobilize today and put her on her pre-op dose of oxycodone and I think she'll likely be ready for d/c tomorrow   LOS: 1 day    Dade Rodin S 03/28/2017, 9:56 AM

## 2017-03-28 NOTE — Evaluation (Signed)
Occupational Therapy Evaluation and Discharge Patient Details Name: Weston AnnaDianna C Rietz MRN: [id number] DOB: 1970-11-30 Today's Date: 03/28/2017    History of Present Illness 46 y.o.femaleadmitted for spondylolisthesis now s/p decompressive lumbar laminectomyL4-5 and L5-S1and PLIFL4-5 and L5-S1   Clinical Impression   Pt reports she was independent with ADL PTA. Currently pt supervision with ADL and functional mobility. All back, safety, and ADL education completed with pt. Pt planning to d/c home with supervision from family/friends. No further acute OT needs identified; signing off at this time. Please re-consult if needs change. Thank you for this referral.    Follow Up Recommendations  No OT follow up;Supervision - Intermittent    Equipment Recommendations  3 in 1 bedside commode    Recommendations for Other Services       Precautions / Restrictions Precautions Precautions: Back Precaution Booklet Issued: No Precaution Comments: Pt able to recall 2/3 back precuations. Reviewed all with pt. Required Braces or Orthoses: Spinal Brace Spinal Brace: Lumbar corset Restrictions Weight Bearing Restrictions: No      Mobility Bed Mobility Overal bed mobility: Needs Assistance Bed Mobility: Rolling;Sidelying to Sit Rolling: Supervision Sidelying to sit: Supervision       General bed mobility comments: Cues for log roll technique. Supervision for safety, no physical assist needed  Transfers Overall transfer level: Needs assistance Equipment used: None Transfers: Sit to/from Stand Sit to Stand: Supervision         General transfer comment: for safety    Balance Overall balance assessment: Needs assistance Sitting-balance support: Feet supported;No upper extremity supported Sitting balance-Leahy Scale: Good     Standing balance support: No upper extremity supported;During functional activity Standing balance-Leahy Scale: Good                            ADL either performed or assessed with clinical judgement   ADL Overall ADL's : Needs assistance/impaired Eating/Feeding: Set up;Sitting   Grooming: Supervision/safety;Standing Grooming Details (indicate cue type and reason): Educated on use of 2 cups for oral care Upper Body Bathing: Set up;Supervision/ safety;Sitting   Lower Body Bathing: Supervison/ safety;Sit to/from stand   Upper Body Dressing : Set up;Supervision/safety;Sitting Upper Body Dressing Details (indicate cue type and reason): for brace management Lower Body Dressing: Set up;Supervision/safety;Sit to/from stand Lower Body Dressing Details (indicate cue type and reason): Pt able to cross foot over opposite knee to don socks. Educated on compensatory strategies for LB ADL. Toilet Transfer: Supervision/safety;Ambulation;BSC     Toileting - Clothing Manipulation Details (indicate cue type and reason): Educated on proper technique for peri care without twisting Tub/ Shower Transfer: Supervision/safety;Walk-in shower;Ambulation;3 in 1 Tub/Shower Transfer Details (indicate cue type and reason): Educated on use of 3 in 1 in shower as a seat and supervision for safety initially Functional mobility during ADLs: Supervision/safety General ADL Comments: Educated pt on maintaining back precuations during functional activities, keeping frequently used items at counter top height, log roll for bed mobility, and frequent mobillity thorughout the day upon return home.     Vision         Perception     Praxis      Pertinent Vitals/Pain Pain Assessment: Faces Faces Pain Scale: Hurts even more Pain Location: back Pain Descriptors / Indicators: Aching;Sore Pain Intervention(s): Monitored during session;Repositioned     Hand Dominance Right   Extremity/Trunk Assessment Upper Extremity Assessment Upper Extremity Assessment: Overall WFL for tasks assessed   Lower Extremity Assessment Lower Extremity Assessment: Defer  to PT  evaluation   Cervical / Trunk Assessment Cervical / Trunk Assessment: Other exceptions Cervical / Trunk Exceptions: s/p spinal sx   Communication Communication Communication: No difficulties   Cognition Arousal/Alertness: Awake/alert Behavior During Therapy: WFL for tasks assessed/performed Overall Cognitive Status: Within Functional Limits for tasks assessed                                     General Comments       Exercises     Shoulder Instructions      Home Living Family/patient expects to be discharged to:: Private residence Living Arrangements: Spouse/significant other Available Help at Discharge: Family Type of Home: House Home Access: Stairs to enter Secretary/administratorntrance Stairs-Number of Steps: 3 Entrance Stairs-Rails: None Home Layout: Two level;Bed/bath upstairs     Bathroom Shower/Tub: Producer, television/film/videoWalk-in shower   Bathroom Toilet: Standard     Home Equipment: None          Prior Functioning/Environment Level of Independence: Independent                 OT Problem List:        OT Treatment/Interventions:      OT Goals(Current goals can be found in the care plan section) Acute Rehab OT Goals Patient Stated Goal: decrease pain OT Goal Formulation: All assessment and education complete, DC therapy  OT Frequency:     Barriers to D/C:            Co-evaluation              AM-PAC PT "6 Clicks" Daily Activity     Outcome Measure Help from another person eating meals?: None Help from another person taking care of personal grooming?: A Little Help from another person toileting, which includes using toliet, bedpan, or urinal?: A Little Help from another person bathing (including washing, rinsing, drying)?: A Little Help from another person to put on and taking off regular upper body clothing?: A Little Help from another person to put on and taking off regular lower body clothing?: A Little 6 Click Score: 19   End of Session Equipment  Utilized During Treatment: Rolling walker;Back brace Nurse Communication: Mobility status;Other (comment)(equipment and f/u needs)  Activity Tolerance: Patient tolerated treatment well Patient left: in chair;with call bell/phone within reach  OT Visit Diagnosis: Unsteadiness on feet (R26.81);Other abnormalities of gait and mobility (R26.89);Pain Pain - part of body: (back)                Time: [id number] OT Time Calculation (min): 14 min Charges:  OT General Charges $OT Visit: 1 Visit OT Evaluation $OT Eval Low Complexity: 1 Low G-Codes:     Gerrell Tabet A. Brett Albinooffey, M.S., OTR/L Pager: [phone]  Gaye AlkenBailey A Ebunoluwa Gernert 03/28/2017, 8:47 AM

## 2017-03-29 MED ORDER — OXYCODONE HCL 20 MG PO TABS: 20.0000 mg | ORAL_TABLET | ORAL | 0 refills | Status: AC | PRN

## 2017-03-29 MED ORDER — TIZANIDINE HCL 4 MG PO TABS: 4.0000 mg | ORAL_TABLET | Freq: Every day | ORAL | 0 refills | Status: AC

## 2017-03-29 NOTE — Progress Notes (Signed)
Subjective: Patient reports Patient still condition of back pain some right leg radiculopathy that started yesterday is getting some better.  Objective: Vital signs in last 24 hours: Temp:  [98.5 F (36.9 C)-100.5 F (38.1 C)] 98.5 F (36.9 C) (11/16 0736) Pulse Rate:  [77-111] 97 (11/16 0736) Resp:  [16-18] 18 (11/16 0736) BP: (96-112)/(57-76) 103/76 (11/16 0736) SpO2:  [97 %-100 %] 100 % (11/16 0736)  Intake/Output from previous day: No intake/output data recorded. Intake/Output this shift: No intake/output data recorded.  Neurologically stable wound clean dry and intact  Lab Results: No results for input(s): WBC, HGB, HCT, PLT in the last 72 hours. BMET No results for input(s): NA, K, CL, CO2, GLUCOSE, BUN, CREATININE, CALCIUM in the last 72 hours.  Studies/Results: Dg Lumbar Spine 2-3 Views  Result Date: 03/27/2017 CLINICAL DATA:  Posterior lumbar interbody fusion L4-S1 EXAM: DG C-ARM 61-120 MIN; LUMBAR SPINE - 2-3 VIEW COMPARISON:  None FLUOROSCOPY TIME:  1 minutes 40 seconds FINDINGS: Three intraoperative fluoroscopic spot images demonstrate posterior lumbar interbody fusion from L4 through S1 with interbody cages in satisfactory position. IMPRESSION: Posterior lumbar interbody fusion at L4-S1. Electronically Signed   By: Elige KoHetal  Patel   On: 03/27/2017 13:19   Dg C-arm 61-120 Min  Result Date: 03/27/2017 CLINICAL DATA:  Posterior lumbar interbody fusion L4-S1 EXAM: DG C-ARM 61-120 MIN; LUMBAR SPINE - 2-3 VIEW COMPARISON:  None FLUOROSCOPY TIME:  1 minutes 40 seconds FINDINGS: Three intraoperative fluoroscopic spot images demonstrate posterior lumbar interbody fusion from L4 through S1 with interbody cages in satisfactory position. IMPRESSION: Posterior lumbar interbody fusion at L4-S1. Electronically Signed   By: Elige KoHetal  Patel   On: 03/27/2017 13:19    Assessment/Plan: Patient not ready discharge will keep another day possible discharge in a.m.  LOS: 2 days      Sheryn Aldaz P 03/29/2017, 11:37 AM

## 2017-03-29 NOTE — Discharge Summary (Signed)
Physician Discharge Summary  Patient ID: Monique Castro MRN: [id number] DOB/AGE: 12/12/1970 46 y.o.  Admit date: 03/27/2017 Discharge date: 03/29/2017  Admission Diagnoses: Lumbar spondylosis and stenosis  Discharge Diagnoses: Same Active Problems:   S/P lumbar spinal fusion   Discharged Condition: good  Hospital Course: 46 year old female who is admitted hospital underwent a two-level lumbar fusion postoperative patient did very well recovered in the floor on the floor was angling and voiding tolerated tolerating a regular diet and pain was controlled on pills patient stable for discharge home on postoperative day 2. Patient will be discharged scheduled follow-up in 2 weeks  Consults: Significant Diagnostic Studies: Treatments: two-level lumbar fusion Discharge Exam: Blood pressure (!) 103/46, pulse 96, temperature 98.2 F (36.8 C), temperature source Oral, resp. rate 18, height 5\' 1"  (1.549 m), weight 53.5 kg (118 lb), SpO2 100 %.  Strength out of 5 wound clean dry and intact  Disposition:  Home   Allergies as of 03/29/2017      Reactions   Sulfa Antibiotics Rash   Amoxicillin-pot Clavulanate Diarrhea   Has patient had a PCN reaction causing immediate rash, facial/tongue/throat swelling, SOB or lightheadedness with hypotension: No Has patient had a PCN reaction causing severe rash involving mucus membranes or skin necrosis: No Has patient had a PCN reaction that required hospitalization: No Has patient had a PCN reaction occurring within the last 10 years: #  #  #  YES  #  #  #  If all of the above answers are "NO", then may proceed with Cephalosporin use.   Doxycycline Nausea And Vomiting   GI Upset   Macrobid [nitrofurantoin Macrocrystal] Nausea Only      Medication List    TAKE these medications   buPROPion 300 MG 24 hr tablet Commonly known as:  WELLBUTRIN XL Take 300 mg every evening by mouth.   desvenlafaxine 50 MG 24 hr tablet Commonly known as:   PRISTIQ Take 50 mg every evening by mouth.   diclofenac sodium 1 % Gel Commonly known as:  VOLTAREN Apply 2 g daily as needed topically (pain).   lisdexamfetamine 50 MG capsule Commonly known as:  VYVANSE Take 100 mg daily by mouth.   Oxycodone HCl 20 MG Tabs Take 20 mg 5 (five) times daily as needed by mouth (pain). What changed:  Another medication with the same name was added. Make sure you understand how and when to take each.   Oxycodone HCl 20 MG Tabs Take 1 tablet (20 mg total) every 4 (four) hours as needed by mouth for severe pain ((score 7 to 10)). What changed:  You were already taking a medication with the same name, and this prescription was added. Make sure you understand how and when to take each.   oxymorphone 10 MG tablet Commonly known as:  OPANA Limit 1 tab by mouth 3 - 5 times per day if tolerated   pantoprazole 40 MG tablet Commonly known as:  PROTONIX Take 40 mg every evening by mouth. Reported on 08/09/2015   simvastatin 10 MG tablet Commonly known as:  ZOCOR Take 10 mg every evening by mouth.   tamsulosin 0.4 MG Caps capsule Commonly known as:  FLOMAX Take 0.4 mg at bedtime by mouth.   tiZANidine 4 MG tablet Commonly known as:  ZANAFLEX Take 1 tablet (4 mg total) at bedtime by mouth.   triamcinolone 55 MCG/ACT Aero nasal inhaler Commonly known as:  NASACORT Place 1 spray daily as needed into the nose (allergies). Reported on  07/11/2015   ZYRTEC-D ALLERGY & CONGESTION 5-120 MG tablet Generic drug:  cetirizine-pseudoephedrine Take 1 tablet daily as needed by mouth for allergies. Reported on 11/30/2015            Durable Medical Equipment  (From admission, onward)        Start     Ordered   03/27/17 1557  DME Walker rolling  Once    Question:  Patient needs a walker to treat with the following condition  Answer:  S/P lumbar fusion   03/27/17 1556   03/27/17 1557  DME 3 n 1  Once     03/27/17 1556     Follow-up Information    Tressie StalkerJenkins,  Jeffrey, MD Follow up.   Specialty:  Neurosurgery Contact information: [address] [place] [address] [phone]           Signed: Mariam DollarCRAM,Atonya Templer P 03/29/2017, 3:06 PM

## 2017-06-27 HISTORY — PX: CERVICAL FUSION: SHX112

## 2017-08-23 ENCOUNTER — Encounter: Payer: BLUE CROSS/BLUE SHIELD | Admitting: Certified Nurse Midwife

## 2017-08-28 ENCOUNTER — Encounter: Payer: BLUE CROSS/BLUE SHIELD | Admitting: Certified Nurse Midwife

## 2017-09-02 ENCOUNTER — Encounter: Payer: BLUE CROSS/BLUE SHIELD | Admitting: Certified Nurse Midwife

## 2017-09-25 ENCOUNTER — Encounter: Payer: Self-pay | Admitting: Certified Nurse Midwife

## 2017-09-25 ENCOUNTER — Ambulatory Visit (INDEPENDENT_AMBULATORY_CARE_PROVIDER_SITE_OTHER): Payer: BLUE CROSS/BLUE SHIELD | Admitting: Certified Nurse Midwife

## 2017-09-25 VITALS — BP 131/92 | HR 98 | Ht 61.0 in | Wt 113.3 lb

## 2017-09-25 DIAGNOSIS — Z1231 Encounter for screening mammogram for malignant neoplasm of breast: Secondary | ICD-10-CM | POA: Diagnosis not present

## 2017-09-25 DIAGNOSIS — Z202 Contact with and (suspected) exposure to infections with a predominantly sexual mode of transmission: Secondary | ICD-10-CM | POA: Diagnosis not present

## 2017-09-25 DIAGNOSIS — Z1239 Encounter for other screening for malignant neoplasm of breast: Secondary | ICD-10-CM

## 2017-09-25 DIAGNOSIS — Z124 Encounter for screening for malignant neoplasm of cervix: Secondary | ICD-10-CM | POA: Diagnosis not present

## 2017-09-25 NOTE — Progress Notes (Signed)
GYNECOLOGY ANNUAL PREVENTATIVE CARE ENCOUNTER NOTE  Subjective:   Monique Castro is a 47 y.o. G34P1102 female here for a routine annual gynecologic exam.  Current complaints: her husband is continuously cheating on her and she is concerned she has contracted an STD.   Denies abnormal vaginal bleeding, discharge, pelvic pain, problems with intercourse or other gynecologic concerns. She has one sexual partner (Husband) Neither of them use condoms.     Gynecologic History No LMP recorded. Patient has had a hysterectomy. Contraception: hysterectomy Last Pap: 10/21/15  Results were: abnormal w/ ASCUS/high risk HPV Last mammogram: June 2017. Results were: normal  Obstetric History OB History  Gravida Para Term Preterm AB Living  SAB TAB Ectopic Multiple Live Births          2    # Outcome Date GA Lbr Len/2nd Weight Sex Delivery Anes PTL Lv  2 Preterm 10/26/95 [redacted]w[redacted]d  6 lb 8 oz (2.948 kg) F Vag-Spont   LIV  1 Term 05/14/90 [redacted]w[redacted]d  6 lb 2.4 oz (2.79 kg) F Vag-Spont   LIV    Past Medical History:  Diagnosis Date  . Allergy   . Anxiety   . Arthritis   . Depression   . Dysrhythmia    diagnosed with tachycardia in but not severe enough to require medications-per patient  . Fibromyalgia   . GERD (gastroesophageal reflux disease)   . Neuromuscular disorder (HCC)   . Strep throat     Past Surgical History:  Procedure Laterality Date  . ABDOMINAL HYSTERECTOMY    . APPENDECTOMY    . BACK SURGERY     ACDF 2011  . TUBAL LIGATION      Current Outpatient Medications on File Prior to Visit  Medication Sig Dispense Refill  . buPROPion (WELLBUTRIN XL) 300 MG 24 hr tablet Take 300 mg every evening by mouth.     . cetirizine-pseudoephedrine (ZYRTEC-D ALLERGY & CONGESTION) 5-120 MG tablet Take 1 tablet daily as needed by mouth for allergies. Reported on 11/30/2015    . desvenlafaxine (PRISTIQ) 50 MG 24 hr tablet Take 50 mg every evening by mouth.     . diclofenac sodium  (VOLTAREN) 1 % GEL Apply 2 g daily as needed topically (pain).    Marland Kitchen lisdexamfetamine (VYVANSE) 50 MG capsule Take 100 mg daily by mouth.    . oxyCODONE 20 MG TABS Take 1 tablet (20 mg total) every 4 (four) hours as needed by mouth for severe pain ((score 7 to 10)). 40 tablet 0  . Oxycodone HCl 20 MG TABS Take 20 mg 5 (five) times daily as needed by mouth (pain).    Marland Kitchen oxymorphone (OPANA) 10 MG tablet Limit 1 tab by mouth 3 - 5 times per day if tolerated (Patient not taking: Reported on 03/20/2017) 140 tablet 0  . pantoprazole (PROTONIX) 40 MG tablet Take 40 mg every evening by mouth. Reported on 08/09/2015    . simvastatin (ZOCOR) 10 MG tablet Take 10 mg every evening by mouth.    . tamsulosin (FLOMAX) 0.4 MG CAPS capsule Take 0.4 mg at bedtime by mouth.    Marland Kitchen tiZANidine (ZANAFLEX) 4 MG tablet Take 1 tablet (4 mg total) at bedtime by mouth. 40 tablet 0  . triamcinolone (NASACORT) 55 MCG/ACT AERO nasal inhaler Place 1 spray daily as needed into the nose (allergies). Reported on 07/11/2015     No current facility-administered medications on file prior to visit.  Allergies  Allergen Reactions  . Sulfa Antibiotics Rash  . Amoxicillin-Pot Clavulanate Diarrhea    Has patient had a PCN reaction causing immediate rash, facial/tongue/throat swelling, SOB or lightheadedness with hypotension: No Has patient had a PCN reaction causing severe rash involving mucus membranes or skin necrosis: No Has patient had a PCN reaction that required hospitalization: No Has patient had a PCN reaction occurring within the last 10 years: #  #  #  YES  #  #  #  If all of the above answers are "NO", then may proceed with Cephalosporin use.   Marland Kitchen Doxycycline Nausea And Vomiting    GI Upset  . Macrobid [Nitrofurantoin Macrocrystal] Nausea Only    Social History   Socioeconomic History  . Marital status: Married    Spouse name: Not on file  . Number of children: Not on file  . Years of education: Not on file  .  Highest education level: Not on file  Occupational History  . Not on file  Social Needs  . Financial resource strain: Not on file  . Food insecurity:    Worry: Not on file    Inability: Not on file  . Transportation needs:    Medical: Not on file    Non-medical: Not on file  Tobacco Use  . Smoking status: Current Every Day Smoker    Packs/day: 0.50    Types: Cigarettes  . Smokeless tobacco: Never Used  Substance and Sexual Activity  . Alcohol use: Yes    Alcohol/week: 0.0 oz    Comment: rarely  . Drug use: No  . Sexual activity: Yes    Partners: Male    Birth control/protection: None  Lifestyle  . Physical activity:    Days per week: Not on file    Minutes per session: Not on file  . Stress: Not on file  Relationships  . Social connections:    Talks on phone: Not on file    Gets together: Not on file    Attends religious service: Not on file    Active member of club or organization: Not on file    Attends meetings of clubs or organizations: Not on file    Relationship status: Not on file  . Intimate partner violence:    Fear of current or ex partner: Not on file    Emotionally abused: Not on file    Physically abused: Not on file    Forced sexual activity: Not on file  Other Topics Concern  . Not on file  Social History Narrative  . Not on file    Family History  Problem Relation Age of Onset  . Alcohol abuse Father     The following portions of the patient's history were reviewed and updated as appropriate: allergies, current medications, past family history, past medical history, past social history, past surgical history and problem list.  Review of Systems Pertinent items noted in HPI and remainder of comprehensive ROS otherwise negative.   Objective:  There were no vitals taken for this visit. CONSTITUTIONAL: Well-developed, well-nourished female in no acute distress.  HENT:  Normocephalic, atraumatic, External right and left ear normal. Oropharynx is  clear and moist EYES: Conjunctivae and EOM are normal. Pupils are equal, round, and reactive to light. No scleral icterus.  NECK: Normal range of motion, supple, no masses.  Normal thyroid.  SKIN: Skin is warm and dry. No rash noted. Not diaphoretic. No erythema. No pallor. NEUROLOGIC: Alert and oriented to person, place,  and time. Normal reflexes, muscle tone coordination. No cranial nerve deficit noted. PSYCHIATRIC: Normal mood and affect. Normal behavior. Normal judgment and thought content. CARDIOVASCULAR: Normal heart rate noted, regular rhythm RESPIRATORY: Clear to auscultation bilaterally. Effort and breath sounds normal, no problems with respiration noted. BREASTS: Symmetric in size. No masses, skin changes, nipple drainage, or lymphadenopathy. ABDOMEN: Soft, normal bowel sounds, no distention noted.  No tenderness, rebound or guarding.  PELVIC: Normal appearing external genitalia; normal appearing vaginal mucosa and cervix.  No abnormal discharge noted.  Pap smear obtained.  No uterus or ovaries. MUSCULOSKELETAL: Normal range of motion. No tenderness.  No cyanosis, clubbing, or edema.  2+ distal pulses.   Assessment and Plan:  Annual gynecologic exam STD testing/NuSwab, Declines annual lab testing stating that it is done by her primary care MD.  Mammogram ordered Will follow up results of pap smear and manage accordingly. Routine preventative health maintenance measures emphasized. Emphasized safe sex practices and use of condoms.  Shanika Creacy,SNM/Jaylina Ramdass,CNM

## 2017-09-25 NOTE — Patient Instructions (Signed)
Preventing Cervical Cancer Cervical cancer is cancer that grows on the cervix. The cervix is at the bottom of the uterus. It connects the uterus to the vagina. The uterus is where a baby develops during pregnancy. Cancer occurs when cells become abnormal and start to grow out of control. Cervical cancer grows slowly and may not cause any symptoms at first. Over time, the cancer can grow deep into the cervix tissue and spread to other areas. If it is found early, cervical cancer can be treated effectively. You can also take steps to prevent this type of cancer. Most cases of cervical cancer are caused by an STI (sexually transmitted infection) called human papillomavirus (HPV). One way to reduce your risk of cervical cancer is to avoid infection with the HPV virus. You can do this by practicing safe sex and by getting the HPV vaccine. Getting regular Pap tests is also important because this can help identify changes in cells that could lead to cancer. Your chances of getting this disease can also be reduced by making certain lifestyle changes. How can I protect myself from cervical cancer? Preventing HPV infection  Ask your health care provider about getting the HPV vaccine. If you are 26 years old or younger, you may need to get this vaccine, which is given in three doses over 6 months. This vaccine protects against the types of HPV that could cause cancer.  Limit the number of people you have sex with. Also avoid having sex with people who have had many sex partners.  Use a latex condom during sex. Getting Pap tests  Get Pap tests regularly, starting at age 21. Talk with your health care provider about how often you need these tests. ? Most women who are 21?47 years of age should have a Pap test every 3 years. ? Most women who are 30?47 years of age should have a Pap test in combination with an HPV test every 5 years. ? Women with a higher risk of cervical cancer, such as those with a weakened  immune system or those who have been exposed to the drug diethylstilbestrol (DES), may need more frequent testing. Making other lifestyle changes  Do not use any products that contain nicotine or tobacco, such as cigarettes and e-cigarettes. If you need help quitting, ask your health care provider.  Eat at least 5 servings of fruits and vegetables every day.  Lose weight if you are overweight. Why are these changes important?  These changes and screening tests are designed to address the factors that are known to increase the risk of cervical cancer. Taking these steps is the best way to reduce your risk.  Having regular Pap tests will help identify changes in cells that could lead to cancer. Steps can then be taken to prevent cancer from developing.  These changes will also help find cervical cancer early. This type of cancer can be treated effectively if it is found early. It can be more dangerous and difficult to treat if cancer has grown deep into your cervix or has spread.  In addition to making you less likely to get cervical cancer, these changes will also provide other health benefits, such as the following: ? Practicing safe sex is important for preventing STIs and unplanned pregnancies. ? Avoiding tobacco can reduce your risk for other cancers and health issues. ? Eating a healthy diet and maintaining a healthy weight are good for your overall health. What can happen if changes are not made? In the   early stages, cervical cancer might not have any symptoms. It can take many years for the cancer to grow and get deep into the cervix tissue. This may be happening without you knowing about it. If you develop any symptoms, such as pelvic pain or unusual discharge or bleeding from your vagina, you should see your health care provider right away. If cervical cancer is not found early, you might need treatments such as radiation, chemotherapy, or surgery. In some cases, surgery may mean that  you will not be able to get pregnant or carry a pregnancy to term. Where to find support: Talk with your health care provider, school nurse, or local health department for guidance about screening and vaccination. Some children and teens may be able to get the HPV vaccine free of charge through the U.S. government's Vaccines for Children (VFC) program. Other places that provide vaccinations include:  Public health clinics. Check with your local health department.  Federally Qualified Health Centers, where you would pay only what you can afford. To find one near you, check this website: www.fqhc.org/find-an-fqhc/  Rural Health Clinics. These are part of a program for Medicare and Medicaid patients who live in rural areas.  The National Breast and Cervical Cancer Early Detection Program also provides breast and cervical cancer screenings and diagnostic services to low-income, uninsured, and underinsured women. Cervical cancer can be passed down through families. Talk with your health care provider or genetic counselor to learn more about genetic testing for cancer. Where to find more information: Learn more about cervical cancer from:  American College of Gynecology: www.acog.org/Patients/FAQs/Cervical-Cancer  American Cancer Society: www.cancer.org/cancer/cervicalcancer/  U.S. Centers for Disease Control and Prevention: www.cdc.gov/cancer/cervical/  Summary  Talk with your health care provider about getting the HPV vaccine.  Be sure to get regular Pap tests as recommended by your health care provider.  See your health care provider right away if you have any pelvic pain or unusual discharge or bleeding from your vagina. This information is not intended to replace advice given to you by your health care provider. Make sure you discuss any questions you have with your health care provider. Document Released: 05/15/2015 Document Revised: 12/27/2015 Document Reviewed: 12/27/2015 Elsevier  Interactive Patient Education  2018 Elsevier Inc.  

## 2017-09-25 NOTE — Progress Notes (Signed)
Pt is here for an annual exam. Pt states last pap was in April 2017.

## 2017-09-26 LAB — HSV 1 AND 2 IGM ABS, INDIRECT

## 2017-09-26 LAB — HIV ANTIBODY (ROUTINE TESTING W REFLEX): HIV Screen 4th Generation wRfx: NONREACTIVE

## 2017-09-26 LAB — HEPATITIS B SURFACE ANTIGEN: Hepatitis B Surface Ag: NEGATIVE

## 2017-09-26 LAB — RPR: RPR Ser Ql: NONREACTIVE

## 2017-09-27 LAB — PAP IG AND HPV HIGH-RISK
HPV, HIGH-RISK: NEGATIVE
PAP Smear Comment: 0

## 2017-09-27 LAB — NUSWAB VAGINITIS PLUS (VG+)
CANDIDA ALBICANS, NAA: NEGATIVE
CHLAMYDIA TRACHOMATIS, NAA: NEGATIVE
Candida glabrata, NAA: NEGATIVE
Neisseria gonorrhoeae, NAA: NEGATIVE
Trich vag by NAA: NEGATIVE

## 2017-09-30 ENCOUNTER — Telehealth: Payer: Self-pay

## 2017-09-30 NOTE — Telephone Encounter (Signed)
Pt informed neg results per provider. 

## 2018-02-25 ENCOUNTER — Telehealth: Payer: Self-pay | Admitting: Certified Nurse Midwife

## 2018-02-25 ENCOUNTER — Telehealth: Payer: Self-pay

## 2018-02-25 ENCOUNTER — Other Ambulatory Visit: Payer: Self-pay | Admitting: Certified Nurse Midwife

## 2018-02-25 MED ORDER — METRONIDAZOLE 500 MG PO TABS
500.0000 mg | ORAL_TABLET | Freq: Two times a day (BID) | ORAL | 0 refills | Status: AC
Start: 2018-02-25 — End: 2018-03-04

## 2018-02-25 NOTE — Telephone Encounter (Signed)
The patient is asking for some Flagyl for a possible bacterial infection, or if her nurse or provider could call her and let her know if she needs to be seen first.    If no appointment needed, please call medication in to [facility] on [address] [place], [place], please advise, thanks.

## 2018-02-25 NOTE — Telephone Encounter (Signed)
Can you let her know I sent medication order in to pharmacy.   Thanks,  Pattricia Boss

## 2018-02-25 NOTE — Telephone Encounter (Signed)
Informed pt script is at pharmacy per AT. Also sent pt mychart link.

## 2018-03-06 ENCOUNTER — Emergency Department: Payer: BLUE CROSS/BLUE SHIELD

## 2018-03-06 ENCOUNTER — Encounter: Payer: Self-pay | Admitting: Emergency Medicine

## 2018-03-06 ENCOUNTER — Inpatient Hospital Stay
Admission: EM | Admit: 2018-03-06 | Discharge: 2018-03-08 | DRG: 091 | Disposition: A | Discharge status: Home / Self Care | Payer: BLUE CROSS/BLUE SHIELD | Attending: Family Medicine | Admitting: Family Medicine

## 2018-03-06 ENCOUNTER — Inpatient Hospital Stay: Payer: BLUE CROSS/BLUE SHIELD

## 2018-03-06 DIAGNOSIS — F17210 Nicotine dependence, cigarettes, uncomplicated: Secondary | ICD-10-CM | POA: Diagnosis present

## 2018-03-06 DIAGNOSIS — R4182 Altered mental status, unspecified: Secondary | ICD-10-CM

## 2018-03-06 DIAGNOSIS — F329 Major depressive disorder, single episode, unspecified: Secondary | ICD-10-CM | POA: Diagnosis present

## 2018-03-06 DIAGNOSIS — Z452 Encounter for adjustment and management of vascular access device: Secondary | ICD-10-CM

## 2018-03-06 DIAGNOSIS — F1310 Sedative, hypnotic or anxiolytic abuse, uncomplicated: Secondary | ICD-10-CM | POA: Diagnosis not present

## 2018-03-06 DIAGNOSIS — F419 Anxiety disorder, unspecified: Secondary | ICD-10-CM | POA: Diagnosis present

## 2018-03-06 DIAGNOSIS — G92 Toxic encephalopathy: Principal | ICD-10-CM | POA: Diagnosis present

## 2018-03-06 DIAGNOSIS — D539 Nutritional anemia, unspecified: Secondary | ICD-10-CM | POA: Diagnosis present

## 2018-03-06 DIAGNOSIS — T43591A Poisoning by other antipsychotics and neuroleptics, accidental (unintentional), initial encounter: Secondary | ICD-10-CM | POA: Diagnosis present

## 2018-03-06 DIAGNOSIS — Z880 Allergy status to penicillin: Secondary | ICD-10-CM

## 2018-03-06 DIAGNOSIS — Z638 Other specified problems related to primary support group: Secondary | ICD-10-CM

## 2018-03-06 DIAGNOSIS — K219 Gastro-esophageal reflux disease without esophagitis: Secondary | ICD-10-CM | POA: Diagnosis present

## 2018-03-06 DIAGNOSIS — G47419 Narcolepsy without cataplexy: Secondary | ICD-10-CM | POA: Diagnosis present

## 2018-03-06 DIAGNOSIS — J9601 Acute respiratory failure with hypoxia: Secondary | ICD-10-CM | POA: Diagnosis present

## 2018-03-06 DIAGNOSIS — E872 Acidosis: Secondary | ICD-10-CM | POA: Diagnosis present

## 2018-03-06 DIAGNOSIS — J690 Pneumonitis due to inhalation of food and vomit: Secondary | ICD-10-CM | POA: Diagnosis present

## 2018-03-06 DIAGNOSIS — R9431 Abnormal electrocardiogram [ECG] [EKG]: Secondary | ICD-10-CM

## 2018-03-06 DIAGNOSIS — M797 Fibromyalgia: Secondary | ICD-10-CM | POA: Diagnosis present

## 2018-03-06 DIAGNOSIS — D649 Anemia, unspecified: Secondary | ICD-10-CM

## 2018-03-06 DIAGNOSIS — Z79891 Long term (current) use of opiate analgesic: Secondary | ICD-10-CM

## 2018-03-06 DIAGNOSIS — Z90710 Acquired absence of both cervix and uterus: Secondary | ICD-10-CM

## 2018-03-06 DIAGNOSIS — Z882 Allergy status to sulfonamides status: Secondary | ICD-10-CM | POA: Diagnosis not present

## 2018-03-06 DIAGNOSIS — Z6821 Body mass index (BMI) 21.0-21.9, adult: Secondary | ICD-10-CM | POA: Diagnosis not present

## 2018-03-06 DIAGNOSIS — F19129 Other psychoactive substance abuse with intoxication, unspecified: Secondary | ICD-10-CM | POA: Diagnosis present

## 2018-03-06 DIAGNOSIS — Z881 Allergy status to other antibiotic agents status: Secondary | ICD-10-CM

## 2018-03-06 DIAGNOSIS — T50904A Poisoning by unspecified drugs, medicaments and biological substances, undetermined, initial encounter: Secondary | ICD-10-CM

## 2018-03-06 DIAGNOSIS — R7989 Other specified abnormal findings of blood chemistry: Secondary | ICD-10-CM

## 2018-03-06 DIAGNOSIS — E876 Hypokalemia: Secondary | ICD-10-CM | POA: Diagnosis present

## 2018-03-06 DIAGNOSIS — G894 Chronic pain syndrome: Secondary | ICD-10-CM | POA: Diagnosis present

## 2018-03-06 DIAGNOSIS — Z79899 Other long term (current) drug therapy: Secondary | ICD-10-CM

## 2018-03-06 DIAGNOSIS — F13188 Sedative, hypnotic or anxiolytic abuse with other sedative, hypnotic or anxiolytic-induced disorder: Secondary | ICD-10-CM | POA: Diagnosis present

## 2018-03-06 DIAGNOSIS — R000 Tachycardia, unspecified: Secondary | ICD-10-CM | POA: Diagnosis present

## 2018-03-06 DIAGNOSIS — Z9049 Acquired absence of other specified parts of digestive tract: Secondary | ICD-10-CM

## 2018-03-06 DIAGNOSIS — T50901A Poisoning by unspecified drugs, medicaments and biological substances, accidental (unintentional), initial encounter: Secondary | ICD-10-CM | POA: Diagnosis present

## 2018-03-06 DIAGNOSIS — J9600 Acute respiratory failure, unspecified whether with hypoxia or hypercapnia: Secondary | ICD-10-CM

## 2018-03-06 DIAGNOSIS — J9811 Atelectasis: Secondary | ICD-10-CM

## 2018-03-06 DIAGNOSIS — Z811 Family history of alcohol abuse and dependence: Secondary | ICD-10-CM

## 2018-03-06 DIAGNOSIS — Z7951 Long term (current) use of inhaled steroids: Secondary | ICD-10-CM

## 2018-03-06 DIAGNOSIS — J189 Pneumonia, unspecified organism: Secondary | ICD-10-CM

## 2018-03-06 HISTORY — DX: Sedative, hypnotic or anxiolytic abuse, uncomplicated: F13.10

## 2018-03-06 LAB — COMPREHENSIVE METABOLIC PANEL
ALBUMIN: 3.6 g/dL (ref 3.5–5.0)
ALT: 16 U/L (ref 0–44)
ANION GAP: 10 (ref 5–15)
AST: 21 U/L (ref 15–41)
Alkaline Phosphatase: 88 U/L (ref 38–126)
BUN: 8 mg/dL (ref 6–20)
CO2: 25 mmol/L (ref 22–32)
Calcium: 8.5 mg/dL — ABNORMAL LOW (ref 8.9–10.3)
Chloride: 104 mmol/L (ref 98–111)
Creatinine, Ser: 0.7 mg/dL (ref 0.44–1.00)
GFR calc Af Amer: >60 mL/min (ref >60)
GFR calc non Af Amer: >60 mL/min (ref >60)
GLUCOSE: 136 mg/dL — AB (ref 70–99)
Potassium: 2.5 mmol/L — CL (ref 3.5–5.1)
Sodium: 139 mmol/L (ref 135–145)
Total Bilirubin: 0.5 mg/dL (ref 0.3–1.2)
Total Protein: 6.6 g/dL (ref 6.5–8.1)

## 2018-03-06 LAB — FERRITIN: Ferritin: 47 ng/mL (ref 11–307)

## 2018-03-06 LAB — ABO/RH: ABO/RH(D): A POS

## 2018-03-06 LAB — BLOOD GAS, ARTERIAL
ACID-BASE EXCESS: 0.9 mmol/L (ref 0.0–2.0)
BICARBONATE: 25.2 mmol/L (ref 20.0–28.0)
FIO2: 0.4
MECHVT: 400 mL
Mechanical Rate: 20
O2 SAT: 99.1 %
PATIENT TEMPERATURE: 37
PCO2 ART: 38 mmHg (ref 32.0–48.0)
PEEP/CPAP: 5 cmH2O
PO2 ART: 132 mmHg — AB (ref 83.0–108.0)
pH, Arterial: 7.43 (ref 7.350–7.450)

## 2018-03-06 LAB — BASIC METABOLIC PANEL
Anion gap: 7 (ref 5–15)
Anion gap: 3 — ABNORMAL LOW (ref 5–15)
BUN: 6 mg/dL (ref 6–20)
BUN: 7 mg/dL (ref 6–20)
CALCIUM: 8.3 mg/dL — AB (ref 8.9–10.3)
CHLORIDE: 106 mmol/L (ref 98–111)
CHLORIDE: 108 mmol/L (ref 98–111)
CO2: 28 mmol/L (ref 22–32)
CO2: 27 mmol/L (ref 22–32)
CREATININE: 0.74 mg/dL (ref 0.44–1.00)
CREATININE: 0.73 mg/dL (ref 0.44–1.00)
Calcium: 8.1 mg/dL — ABNORMAL LOW (ref 8.9–10.3)
GFR calc Af Amer: >60 mL/min (ref >60)
GFR calc non Af Amer: >60 mL/min (ref >60)
GFR calc non Af Amer: >60 mL/min (ref >60)
Glucose, Bld: 92 mg/dL (ref 70–99)
Glucose, Bld: 95 mg/dL (ref 70–99)
Potassium: 4 mmol/L (ref 3.5–5.1)
Potassium: 3.6 mmol/L (ref 3.5–5.1)
Sodium: 141 mmol/L (ref 135–145)
Sodium: 138 mmol/L (ref 135–145)

## 2018-03-06 LAB — URINALYSIS, ROUTINE W REFLEX MICROSCOPIC
BILIRUBIN URINE: NEGATIVE
Glucose, UA: NEGATIVE mg/dL
HGB URINE DIPSTICK: NEGATIVE
KETONES UR: NEGATIVE mg/dL
Leukocytes, UA: NEGATIVE
NITRITE: NEGATIVE
PROTEIN: NEGATIVE mg/dL
Specific Gravity, Urine: 1.025 (ref 1.005–1.030)
pH: 5.5 (ref 5.0–8.0)

## 2018-03-06 LAB — URINE DRUG SCREEN, QUALITATIVE (ARMC ONLY)
Amphetamines, Ur Screen: NOT DETECTED
Barbiturates, Ur Screen: NOT DETECTED
Benzodiazepine, Ur Scrn: POSITIVE — AB
COCAINE METABOLITE, UR ~~LOC~~: NOT DETECTED
Cannabinoid 50 Ng, Ur ~~LOC~~: NOT DETECTED
MDMA (ECSTASY) UR SCREEN: NOT DETECTED
METHADONE SCREEN, URINE: NOT DETECTED
Opiate, Ur Screen: POSITIVE — AB
Phencyclidine (PCP) Ur S: NOT DETECTED
TRICYCLIC, UR SCREEN: POSITIVE — AB

## 2018-03-06 LAB — PREPARE RBC (CROSSMATCH)

## 2018-03-06 LAB — GLUCOSE, CAPILLARY: Glucose-Capillary: 160 mg/dL — ABNORMAL HIGH (ref 70–99)

## 2018-03-06 LAB — CBC WITH DIFFERENTIAL/PLATELET
ABS IMMATURE GRANULOCYTES: 0.02 10*3/uL (ref 0.00–0.07)
BASOS ABS: 0 10*3/uL (ref 0.0–0.1)
Basophils Relative: 1 %
Eosinophils Absolute: 0.1 10*3/uL (ref 0.0–0.5)
Eosinophils Relative: 2 %
HEMATOCRIT: 20 % — AB (ref 36.0–46.0)
HEMOGLOBIN: 6.1 g/dL — AB (ref 12.0–15.0)
Immature Granulocytes: 0 %
LYMPHS ABS: 2.2 10*3/uL (ref 0.7–4.0)
LYMPHS PCT: 46 %
MCH: 31 pg (ref 26.0–34.0)
MCHC: 30.5 g/dL (ref 30.0–36.0)
MCV: 101.5 fL — ABNORMAL HIGH (ref 80.0–100.0)
MONO ABS: 0.4 10*3/uL (ref 0.1–1.0)
Monocytes Relative: 9 %
NEUTROS ABS: 1.9 10*3/uL (ref 1.7–7.7)
NRBC: 0 % (ref 0.0–0.2)
Neutrophils Relative %: 42 %
Platelets: 176 10*3/uL (ref 150–400)
RBC: 1.97 MIL/uL — AB (ref 3.87–5.11)
RDW: 11.9 % (ref 11.5–15.5)
WBC: 4.7 10*3/uL (ref 4.0–10.5)

## 2018-03-06 LAB — MRSA PCR SCREENING: MRSA by PCR: NEGATIVE

## 2018-03-06 LAB — PROTIME-INR
INR: 0.94
PROTHROMBIN TIME: 12.5 s (ref 11.4–15.2)

## 2018-03-06 LAB — HEMOGLOBIN AND HEMATOCRIT, BLOOD
HCT: 36.2 % (ref 36.0–46.0)
HEMATOCRIT: 41.4 % (ref 36.0–46.0)
HEMOGLOBIN: 13.4 g/dL (ref 12.0–15.0)
Hemoglobin: 11.8 g/dL — ABNORMAL LOW (ref 12.0–15.0)

## 2018-03-06 LAB — IRON AND TIBC
Iron: 93 ug/dL (ref 28–170)
SATURATION RATIOS: 30 % (ref 10.4–31.8)
TIBC: 305 ug/dL (ref 250–450)
UIBC: 213 ug/dL

## 2018-03-06 LAB — MAGNESIUM
MAGNESIUM: 2 mg/dL (ref 1.7–2.4)
Magnesium: 1.6 mg/dL — ABNORMAL LOW (ref 1.7–2.4)
Magnesium: 2.1 mg/dL (ref 1.7–2.4)

## 2018-03-06 LAB — PHOSPHORUS
PHOSPHORUS: 3.3 mg/dL (ref 2.5–4.6)
PHOSPHORUS: 2.5 mg/dL (ref 2.5–4.6)
Phosphorus: 4.5 mg/dL (ref 2.5–4.6)

## 2018-03-06 LAB — LACTIC ACID, PLASMA
Lactic Acid, Venous: 3.2 mmol/L (ref 0.5–1.9)
Lactic Acid, Venous: 1.2 mmol/L (ref 0.5–1.9)

## 2018-03-06 LAB — RETICULOCYTES
Immature Retic Fract: 7.3 % (ref 2.3–15.9)
RBC.: 3.82 MIL/uL — AB (ref 3.87–5.11)
RETIC CT PCT: 1.5 % (ref 0.4–3.1)
Retic Count, Absolute: 56.9 10*3/uL (ref 19.0–186.0)

## 2018-03-06 LAB — CK: Total CK: 41 U/L (ref 38–234)

## 2018-03-06 LAB — ACETAMINOPHEN LEVEL: Acetaminophen (Tylenol), Serum: <10 ug/mL — ABNORMAL LOW (ref 10–30)

## 2018-03-06 LAB — PROCALCITONIN: Procalcitonin: <0.1 ng/mL

## 2018-03-06 LAB — HCG, QUANTITATIVE, PREGNANCY: hCG, Beta Chain, Quant, S: 2 m[IU]/mL (ref <5)

## 2018-03-06 LAB — SALICYLATE LEVEL: Salicylate Lvl: <7 mg/dL (ref 2.8–30.0)

## 2018-03-06 LAB — FOLATE: Folate: 7.3 ng/mL (ref >5.9)

## 2018-03-06 LAB — ETHANOL: Alcohol, Ethyl (B): <10 mg/dL (ref <10)

## 2018-03-06 LAB — TRANSFERRIN: Transferrin: 220 mg/dL (ref 192–382)

## 2018-03-06 LAB — VITAMIN B12: Vitamin B-12: 210 pg/mL (ref 180–914)

## 2018-03-06 MED ORDER — SODIUM BICARBONATE 8.4 % IV SOLN
50.0000 meq | Freq: Once | INTRAVENOUS | Status: AC
  Administered 2018-03-06: 50 meq via INTRAVENOUS
  Filled 2018-03-06: qty 50

## 2018-03-06 MED ORDER — CHLORHEXIDINE GLUCONATE 0.12% ORAL RINSE (MEDLINE KIT)
15.0000 mL | Freq: Two times a day (BID) | OROMUCOSAL | Status: DC
  Administered 2018-03-06 – 2018-03-07 (×3): 15 mL via OROMUCOSAL

## 2018-03-06 MED ORDER — FENTANYL 2500MCG IN NS 250ML (10MCG/ML) PREMIX INFUSION
INTRAVENOUS | Status: AC
  Administered 2018-03-06: 150 ug/h via INTRAVENOUS
  Filled 2018-03-06: qty 250

## 2018-03-06 MED ORDER — SODIUM CHLORIDE 0.9 % IV SOLN
1.0000 g | Freq: Once | INTRAVENOUS | Status: AC
  Administered 2018-03-06: 1 g via INTRAVENOUS
  Filled 2018-03-06: qty 10

## 2018-03-06 MED ORDER — BISACODYL 5 MG PO TBEC: 5.0000 mg | DELAYED_RELEASE_TABLET | Freq: Every day | ORAL | Status: DC | PRN

## 2018-03-06 MED ORDER — ORAL CARE MOUTH RINSE
15.0000 mL | OROMUCOSAL | Status: DC
  Administered 2018-03-06 – 2018-03-07 (×10): 15 mL via OROMUCOSAL

## 2018-03-06 MED ORDER — ROCURONIUM BROMIDE 100 MG/10ML IV SOLN
1.0000 mg/kg | Freq: Once | INTRAVENOUS | Status: AC
  Administered 2018-03-06: 04:00:00 via INTRAVENOUS

## 2018-03-06 MED ORDER — SODIUM CHLORIDE 0.9 % IV SOLN
2.0000 mg/h | INTRAVENOUS | Status: DC
  Administered 2018-03-06: 2 mg/h via INTRAVENOUS
  Administered 2018-03-06: 4 mg/h via INTRAVENOUS
  Filled 2018-03-06 (×3): qty 10

## 2018-03-06 MED ORDER — SENNOSIDES-DOCUSATE SODIUM 8.6-50 MG PO TABS
1.0000 | ORAL_TABLET | Freq: Every evening | ORAL | Status: DC | PRN
  Administered 2018-03-07: 1 via ORAL
  Filled 2018-03-06: qty 1

## 2018-03-06 MED ORDER — KCL-LACTATED RINGERS 20 MEQ/L IV SOLN: INTRAVENOUS | Status: DC

## 2018-03-06 MED ORDER — MIDAZOLAM HCL 5 MG/5ML IJ SOLN
5.0000 mg | Freq: Once | INTRAMUSCULAR | Status: AC
  Administered 2018-03-06: 5 mg via INTRAVENOUS

## 2018-03-06 MED ORDER — FENTANYL 2500MCG IN NS 250ML (10MCG/ML) PREMIX INFUSION
150.0000 ug/h | INTRAVENOUS | Status: DC
  Administered 2018-03-06 (×2): 150 ug/h via INTRAVENOUS
  Filled 2018-03-06: qty 250

## 2018-03-06 MED ORDER — ETOMIDATE 2 MG/ML IV SOLN
0.3000 mg/kg | Freq: Once | INTRAVENOUS | Status: AC
  Administered 2018-03-06: 04:00:00 via INTRAVENOUS

## 2018-03-06 MED ORDER — HALOPERIDOL LACTATE 5 MG/ML IJ SOLN
INTRAMUSCULAR | Status: AC
  Filled 2018-03-06: qty 1

## 2018-03-06 MED ORDER — POTASSIUM CHLORIDE 10 MEQ/100ML IV SOLN
10.0000 meq | INTRAVENOUS | Status: AC
  Administered 2018-03-06 (×6): 10 meq via INTRAVENOUS
  Filled 2018-03-06 (×6): qty 100

## 2018-03-06 MED ORDER — PANTOPRAZOLE SODIUM 40 MG IV SOLR
40.0000 mg | Freq: Two times a day (BID) | INTRAVENOUS | Status: DC
  Administered 2018-03-06 – 2018-03-07 (×3): 40 mg via INTRAVENOUS
  Filled 2018-03-06 (×3): qty 40

## 2018-03-06 MED ORDER — ACETAMINOPHEN 325 MG PO TABS
650.0000 mg | ORAL_TABLET | ORAL | Status: DC | PRN
  Administered 2018-03-08: 650 mg via ORAL
  Filled 2018-03-06: qty 2

## 2018-03-06 MED ORDER — SODIUM CHLORIDE 0.9 % IV BOLUS
1000.0000 mL | Freq: Once | INTRAVENOUS | Status: AC
  Administered 2018-03-06: 1000 mL via INTRAVENOUS

## 2018-03-06 MED ORDER — MIDAZOLAM HCL 5 MG/5ML IJ SOLN
INTRAMUSCULAR | Status: AC
  Administered 2018-03-06: 5 mg via INTRAVENOUS
  Filled 2018-03-06: qty 5

## 2018-03-06 MED ORDER — SODIUM CHLORIDE 0.9 % IV SOLN
10.0000 mL/h | Freq: Once | INTRAVENOUS | Status: AC
  Administered 2018-03-06: 10 mL/h via INTRAVENOUS

## 2018-03-06 MED ORDER — MAGNESIUM SULFATE 4 GM/100ML IV SOLN
4.0000 g | Freq: Once | INTRAVENOUS | Status: DC
  Filled 2018-03-06: qty 100

## 2018-03-06 MED ORDER — SODIUM CHLORIDE 0.9% FLUSH
10.0000 mL | Freq: Two times a day (BID) | INTRAVENOUS | Status: DC
  Administered 2018-03-06 – 2018-03-07 (×3): 10 mL
  Administered 2018-03-07: 30 mL
  Administered 2018-03-08: 10 mL

## 2018-03-06 MED ORDER — ENOXAPARIN SODIUM 40 MG/0.4ML ~~LOC~~ SOLN: 40.0000 mg | SUBCUTANEOUS | Status: DC

## 2018-03-06 MED ORDER — SODIUM CHLORIDE 0.9% FLUSH: 10.0000 mL | INTRAVENOUS | Status: DC | PRN

## 2018-03-06 MED ORDER — PROPOFOL 1000 MG/100ML IV EMUL: 5.0000 ug/kg/min | Freq: Once | INTRAVENOUS | Status: DC

## 2018-03-06 MED ORDER — SODIUM CHLORIDE 0.9 % IV SOLN
3.0000 g | Freq: Four times a day (QID) | INTRAVENOUS | Status: DC
  Administered 2018-03-06 – 2018-03-08 (×8): 3 g via INTRAVENOUS
  Filled 2018-03-06 (×14): qty 3

## 2018-03-06 MED ORDER — MAGNESIUM SULFATE 2 GM/50ML IV SOLN: 2.0000 g | Freq: Once | INTRAVENOUS | Status: DC

## 2018-03-06 MED ORDER — POTASSIUM CHLORIDE 2 MEQ/ML IV SOLN
INTRAVENOUS | Status: DC
  Filled 2018-03-06 (×2): qty 1000

## 2018-03-06 MED ORDER — MAGNESIUM SULFATE 2 GM/50ML IV SOLN
2.0000 g | Freq: Once | INTRAVENOUS | Status: AC
  Administered 2018-03-06: 2 g via INTRAVENOUS
  Filled 2018-03-06: qty 50

## 2018-03-06 MED FILL — Rocuronium Bromide IV Soln 100 MG/10ML (10 MG/ML): INTRAVENOUS | Qty: 10 | Status: AC

## 2018-03-06 NOTE — Consult Note (Addendum)
Pharmacy Antibiotic Note  Monique Castro is a 47 y.o. female admitted on 03/06/2018 with pneumonia.  Pharmacy has been consulted for Unasyn dosing. Patient was admitted for Seroquel overdose. Patient has Augmentin allergy, but is only listed as diarrhea. Patient is intubated secondary to overdose. Covering with Unasyn for potential aspiration PNA.  Plan: Ordered Unasyn 3 g IV q6h.  Height: 5\' 4"  (162.6 cm) Weight: 128 lb 1.4 oz (58.1 kg) IBW/kg (Calculated) : 54.7  Temp (24hrs), Avg:97.9 F (36.6 C), Min:93.7 F (34.3 C), Max:100 F (37.8 C)  Recent Labs  Lab 03/06/18 0319 03/06/18 0529 03/06/18 1147  WBC 4.7  --   --   CREATININE 0.70  --  0.74  LATICACIDVEN 3.2* 1.2  --     Estimated Creatinine Clearance: 75.1 mL/min (by C-G formula based on SCr of 0.74 mg/dL).    Allergies  Allergen Reactions  . Sulfa Antibiotics Rash  . Amoxicillin-Pot Clavulanate Diarrhea    Has patient had a PCN reaction causing immediate rash, facial/tongue/throat swelling, SOB or lightheadedness with hypotension: No Has patient had a PCN reaction causing severe rash involving mucus membranes or skin necrosis: No Has patient had a PCN reaction that required hospitalization: No Has patient had a PCN reaction occurring within the last 10 years: #  #  #  YES  #  #  #  If all of the above answers are "NO", then may proceed with Cephalosporin use.   Marland Kitchen Doxycycline Nausea And Vomiting    GI Upset  . Macrobid [Nitrofurantoin Macrocrystal] Nausea Only    Antimicrobials this admission: 10/24 Unasyn >>    Dose adjustments this admission: N/A  Microbiology results:  10/24 MRSA PCR: (-)  Thank you for allowing pharmacy to be a part of this patient's care.   Mauri Reading, PharmD Pharmacy Resident  03/06/2018 1:18 PM

## 2018-03-06 NOTE — Consult Note (Signed)
Name: Monique Castro MRN: [id number] DOB: [birth year]     CONSULTATION DATE: 03/06/2018  HISTORY OF PRESENT ILLNESS:    47 years old with history of anxiety, depression, fibromyalgia, chronic pain, previous history of drug overdose.  The patient presented to the ED with altered mental status and history reported by friend of overdose on Seroquel.  Not responded to Narcan and had to be intubated in the ED for airway protection. All history was obtained from admitting physician and EMR. Patient arrived to the intensive care unit on the ventilator, sedated with Versed and fentanyl in no distress  PAST MEDICAL HISTORY :   has a past medical history of Allergy, Anxiety, Arthritis, Depression, Dysrhythmia, Fibromyalgia, GERD (gastroesophageal reflux disease), Neuromuscular disorder (HCC), and Strep throat.  has a past surgical history that includes Appendectomy; Abdominal hysterectomy; Tubal ligation; and Back surgery. Prior to Admission medications   Medication Sig Start Date End Date Taking? Authorizing Provider  buPROPion (WELLBUTRIN XL) 300 MG 24 hr tablet Take 300 mg by mouth daily.  01/12/15  Yes [provider]  desvenlafaxine (PRISTIQ) 50 MG 24 hr tablet Take 50 mg every evening by mouth.    Yes [provider]  hydrOXYzine (VISTARIL) 25 MG capsule Take 1 capsule by mouth 2 (two) times daily as needed for anxiety. 02/12/18  Yes [provider]  lisdexamfetamine (VYVANSE) 50 MG capsule Take 100 mg daily by mouth.   Yes [provider]  oxyCODONE 20 MG TABS Take 1 tablet (20 mg total) every 4 (four) hours as needed by mouth for severe pain ((score 7 to 10)). 03/29/17  Yes Donalee Citrin, MD  Oxycodone HCl 20 MG TABS Take 20 mg by mouth every 4 (four) hours as needed (severe pain). (score 7 to 10)   Yes [provider]  pantoprazole (PROTONIX) 40 MG tablet Take 40 mg every evening by mouth. Reported on 08/09/2015 10/18/14  Yes [provider]    QUEtiapine (SEROQUEL) 50 MG tablet Take 1 tablet by mouth at bedtime. 02/12/18  Yes [provider]  tiZANidine (ZANAFLEX) 4 MG tablet Take 1 tablet (4 mg total) at bedtime by mouth. 03/29/17  Yes Donalee Citrin, MD  buPROPion (WELLBUTRIN XL) 300 MG 24 hr tablet Take 300 mg every evening by mouth.  01/12/15 03/20/17  [provider]  cetirizine-pseudoephedrine (ZYRTEC-D ALLERGY & CONGESTION) 5-120 MG tablet Take 1 tablet daily as needed by mouth for allergies. Reported on 11/30/2015 10/07/07   [provider]  cholestyramine Lanetta Inch) 4 GM/DOSE powder Take by mouth. 07/08/13   [provider]  clotrimazole-betamethasone (LOTRISONE) cream APP EXT AA BID 06/24/17   [provider]  diclofenac sodium (VOLTAREN) 1 % GEL Apply 2 g daily as needed topically (pain).    [provider]  oxymorphone (OPANA) 10 MG tablet Limit 1 tab by mouth 3 - 5 times per day if tolerated Patient not taking: Reported on 03/20/2017 12/29/15   Ewing Schlein, MD  simvastatin (ZOCOR) 10 MG tablet Take 10 mg every evening by mouth.    [provider]  tamsulosin (FLOMAX) 0.4 MG CAPS capsule Take 0.4 mg at bedtime by mouth.    [provider]  triamcinolone (NASACORT) 55 MCG/ACT AERO nasal inhaler Place 1 spray daily as needed into the nose (allergies). Reported on 07/11/2015    [provider]   Allergies  Allergen Reactions  . Sulfa Antibiotics Rash  . Amoxicillin-Pot Clavulanate Diarrhea    Has patient had a PCN reaction causing  immediate rash, facial/tongue/throat swelling, SOB or lightheadedness with hypotension: No Has patient had a PCN reaction causing severe rash involving mucus membranes or skin necrosis: No Has patient had a PCN reaction that required hospitalization: No Has patient had a PCN reaction occurring within the last 10 years: #  #  #  YES  #  #  #  If all of the above answers are "NO", then may proceed with Cephalosporin use.   Marland Kitchen  Doxycycline Nausea And Vomiting    GI Upset  . Macrobid [Nitrofurantoin Macrocrystal] Nausea Only    FAMILY HISTORY:  family history includes Alcohol abuse in her father. SOCIAL HISTORY:  reports that she has been smoking cigarettes. She has been smoking about 0.50 packs per day. She has never used smokeless tobacco. She reports that she drinks alcohol. She reports that she does not use drugs.  REVIEW OF SYSTEMS:   Unable to obtain due to critical illness   VITAL SIGNS: Temp:  [93.7 F (34.3 C)-100 F (37.8 C)] 99.3 F (37.4 C) (10/24 1800) Pulse Rate:  [85-151] 89 (10/24 1800) Resp:  [19-33] 20 (10/24 1800) BP: (82-148)/(55-99) 90/64 (10/24 1800) SpO2:  [98 %-100 %] 100 % (10/24 1600) FiO2 (%):  [28 %-40 %] 28 % (10/24 1307) Weight:  [58.1 kg] 58.1 kg (10/24 1610)  Physical Examination:  Sedated with fentanyl + Versed  To RASS of -3.  Reported by nursing awake moving extremities with tapering sedation On vent, no distress, bilateral equal air entry with no adventitious sounds S1 & S2 are audible with no murmur Benign abdomen with feeble peristalsis No leg edema   ASSESSMENT / PLAN:  Acute respiratory failure intubated for airway protection  Altered mental status was drug intoxication/overmedication.  UDS positive for benzodiazepine + opiates + try cyclic.  Reported by nursing the patient was able to move all extremities with tapering off sedation. -Monitor neuro status consider CT head if no improvement.  Possible suicidal attempt with history of depression and previous drug overdose -Psych evaluation once extubated and medically clear.  Atelectasis was possible aspiration.  Bibasilar airspace disease -Empiric Unasyn.  Monitor CXR + CBC + FiO2 requirement -Monitor procalcitonin  QTC prolongation initially 586 and improved to 462 -Monitor EKG  Lactic acidemia most possible sepsis versus tissue hypoperfusion -Optimize hydration, monitor lactic acid and  procalcitonin.  Anemia versus not better with initial hemoglobin 6.1 repeat 11.8 -Monitor H&H and keep hemoglobin more than 7 g/dL  Fibromyalgia -Optimize analgesia  GERD -PPI  Hypokalemia and hypomagnesemia -Replete and monitor electrolytes  Full code  DVT & GI prophylaxis.  Continue with supportive care.  Critical care time 45 minutes

## 2018-03-06 NOTE — ED Notes (Addendum)
MEDICATION BROUGHT IN WITH PT BY EMS:  OXYCODONE HCI 20MG , 90 COUNT---- BOTTLE EMPTY VYVANSE 50MG , 60 COUNT---- 4 IN BOTTLE TIZANIDINE HCI 4MG , 40 COUNT---- 21 1/2 IN BOTTLE METRONIDAZOLE 500MG , 14 COUNT---- 2 IN BOTTLE BLUE ROUND PILLS, UNMARKED BOTTLE---- 20 IN BOTTLE   ALL PLACED IN PHARMACY BAG AND WALKED TO PHARMACY BY THIS RN.

## 2018-03-06 NOTE — Consult Note (Addendum)
Pharmacy Electrolyte Monitoring Consult:  Pharmacy consulted to assist in monitoring and replacing electrolytes in this 47 y.o. female admitted on 03/06/2018 with overdose on Seroquel. QTc was 586 upon admission; has decreased to 462.  Labs:  Sodium (mmol/L)  Date Value  03/06/2018 141  07/02/2013 139   Potassium (mmol/L)  Date Value  03/06/2018 4.0  07/02/2013 2.8 (L)   Magnesium (mg/dL)  Date Value  09/81/1914 2.0   Phosphorus (mg/dL)  Date Value  78/29/5621 3.3   Calcium (mg/dL)  Date Value  30/86/5784 8.1 (L)   Calcium, Total (mg/dL)  Date Value  69/62/9528 8.7   Albumin (g/dL)  Date Value  41/32/4401 3.6  07/02/2013 3.6    Assessment/Plan: Goal electrolytes: K = 4.0 and Mg = 2.0.  Patient received KCl 10 mEq IV x 6, and Magnesium 2 g IV.   BMP is ordered q8h; will continue to monitor.   Mauri Reading, PharmD Pharmacy Resident  03/06/2018 1:03 PM

## 2018-03-06 NOTE — H&P (Addendum)
[facility] - [place] at [facility]   PATIENT NAME: Monique Castro    MR#:  [id number]  DATE OF BIRTH:  1970/07/09  DATE OF ADMISSION:  03/06/2018  PRIMARY CARE PHYSICIAN: Pearson Grippe, MD   REQUESTING/REFERRING PHYSICIAN: Loleta Rose, MD  CHIEF COMPLAINT:   Chief Complaint  Patient presents with  . Ingestion    HISTORY OF PRESENT ILLNESS:  Monique Castro  is a 47 y.o. female with a known history of anxiety/depression/insomnia, DJD/OA, fibromyalgia, chronic pain p/w overdose, acute hypoxemic respiratory failure. Pt is already intubated at the time of my assessment. Hx limited, pt's family estranged. Pt reportedly w/ friend, starting taking Seroquel tablets to help w/ sleep. Friend told pt to stop, pt continued taking tablets, EMS called. Per EMS, pt medication bottles included Oxycodone, Vyvanse, Tizanidine, Flagyl, and an unmarked bottle w/ round blue tablets (possibly Xanax). Did not respond to Narcan in field. Obtunded on arrival, poorly arousable, not protecting airway, intubated in ED. No further Hx/ROS is obtainable.  PAST MEDICAL HISTORY:   Past Medical History:  Diagnosis Date  . Allergy   . Anxiety   . Arthritis   . Depression   . Dysrhythmia    diagnosed with tachycardia in but not severe enough to require medications-per patient  . Fibromyalgia   . GERD (gastroesophageal reflux disease)   . Neuromuscular disorder (HCC)   . Strep throat     PAST SURGICAL HISTORY:   Past Surgical History:  Procedure Laterality Date  . ABDOMINAL HYSTERECTOMY    . APPENDECTOMY    . BACK SURGERY     ACDF 2011  . TUBAL LIGATION      SOCIAL HISTORY:   Social History   Tobacco Use  . Smoking status: Current Every Day Smoker    Packs/day: 0.50    Types: Cigarettes  . Smokeless tobacco: Never Used  Substance Use Topics  . Alcohol use: Yes    Alcohol/week: 0.0 standard drinks    Comment: rarely    FAMILY HISTORY:   Family History  Problem Relation  Age of Onset  . Alcohol abuse Father     DRUG ALLERGIES:   Allergies  Allergen Reactions  . Sulfa Antibiotics Rash  . Amoxicillin-Pot Clavulanate Diarrhea    Has patient had a PCN reaction causing immediate rash, facial/tongue/throat swelling, SOB or lightheadedness with hypotension: No Has patient had a PCN reaction causing severe rash involving mucus membranes or skin necrosis: No Has patient had a PCN reaction that required hospitalization: No Has patient had a PCN reaction occurring within the last 10 years: #  #  #  YES  #  #  #  If all of the above answers are "NO", then may proceed with Cephalosporin use.   Marland Kitchen Doxycycline Nausea And Vomiting    GI Upset  . Macrobid [Nitrofurantoin Macrocrystal] Nausea Only    REVIEW OF SYSTEMS:   Review of Systems  Unable to perform ROS: Intubated  Psychiatric/Behavioral: The patient has insomnia.    MEDICATIONS AT HOME:   Prior to Admission medications   Medication Sig Start Date End Date Taking? Authorizing Provider  buPROPion (WELLBUTRIN XL) 300 MG 24 hr tablet Take 300 mg by mouth daily.  01/12/15  Yes [provider]  desvenlafaxine (PRISTIQ) 50 MG 24 hr tablet Take 50 mg every evening by mouth.    Yes [provider]  hydrOXYzine (VISTARIL) 25 MG capsule Take 1 capsule by mouth 2 (two) times daily as needed for anxiety. 02/12/18  Yes [provider]  lisdexamfetamine (VYVANSE) 50 MG capsule Take 100 mg daily by mouth.   Yes [provider]  oxyCODONE 20 MG TABS Take 1 tablet (20 mg total) every 4 (four) hours as needed by mouth for severe pain ((score 7 to 10)). 03/29/17  Yes Donalee Citrin, MD  Oxycodone HCl 20 MG TABS Take 20 mg by mouth every 4 (four) hours as needed (severe pain). (score 7 to 10)   Yes [provider]  pantoprazole (PROTONIX) 40 MG tablet Take 40 mg every evening by mouth. Reported on 08/09/2015 10/18/14  Yes [provider]  QUEtiapine (SEROQUEL) 50 MG tablet Take 1  tablet by mouth at bedtime. 02/12/18  Yes [provider]  tiZANidine (ZANAFLEX) 4 MG tablet Take 1 tablet (4 mg total) at bedtime by mouth. 03/29/17  Yes Donalee Citrin, MD  buPROPion (WELLBUTRIN XL) 300 MG 24 hr tablet Take 300 mg every evening by mouth.  01/12/15 03/20/17  [provider]  cetirizine-pseudoephedrine (ZYRTEC-D ALLERGY & CONGESTION) 5-120 MG tablet Take 1 tablet daily as needed by mouth for allergies. Reported on 11/30/2015 10/07/07   [provider]  cholestyramine Lanetta Inch) 4 GM/DOSE powder Take by mouth. 07/08/13   [provider]  clotrimazole-betamethasone (LOTRISONE) cream APP EXT AA BID 06/24/17   [provider]  diclofenac sodium (VOLTAREN) 1 % GEL Apply 2 g daily as needed topically (pain).    [provider]  oxymorphone (OPANA) 10 MG tablet Limit 1 tab by mouth 3 - 5 times per day if tolerated Patient not taking: Reported on 03/20/2017 12/29/15   Ewing Schlein, MD  simvastatin (ZOCOR) 10 MG tablet Take 10 mg every evening by mouth.    [provider]  tamsulosin (FLOMAX) 0.4 MG CAPS capsule Take 0.4 mg at bedtime by mouth.    [provider]  triamcinolone (NASACORT) 55 MCG/ACT AERO nasal inhaler Place 1 spray daily as needed into the nose (allergies). Reported on 07/11/2015    [provider]      VITAL SIGNS:  Blood pressure 109/86, pulse (!) 109, temperature (!) 94.3 F (34.6 C), resp. rate 19, height 5\' 4"  (1.626 m), weight 58.1 kg, SpO2 100 %.  PHYSICAL EXAMINATION:  Physical Exam  Constitutional: She appears well-developed and well-nourished. She is sleeping.  Non-toxic appearance. She does not have a sickly appearance. She does not appear ill. No distress. She is sedated and intubated.  HENT:  Head: Atraumatic.  Eyes: Conjunctivae and lids are normal. No scleral icterus.  Neck: Neck supple. No JVD present. No thyromegaly present.  Cardiovascular: Regular rhythm, S1 normal and S2 normal.   No extrasystoles are present. Tachycardia present. Exam reveals no gallop, no S3, no S4, no distant heart sounds and no friction rub.  No murmur heard. Pulmonary/Chest: Effort normal and breath sounds normal. No accessory muscle usage or stridor. No apnea, no tachypnea and no bradypnea. She is intubated. No respiratory distress. She has no decreased breath sounds. She has no wheezes. She has no rhonchi. She has no rales.  Abdominal: Soft. She exhibits no distension. Bowel sounds are decreased. There is no tenderness. There is no rigidity, no rebound and no guarding.  Musculoskeletal: Normal range of motion. She exhibits no edema or tenderness.  Lymphadenopathy:    She has no cervical adenopathy.  Neurological: She is unresponsive.  Intubated/sedated.  Skin: Skin is warm, dry and intact. No rash noted. She is not diaphoretic. No erythema. No pallor.  Psychiatric: She is noncommunicative.  Intubated/sedated. She is inattentive.   LABORATORY PANEL:   CBC Recent Labs  Lab 03/06/18 0319  WBC 4.7  HGB 6.1*  HCT 20.0*  PLT 176   ------------------------------------------------------------------------------------------------------------------  Chemistries  Recent Labs  Lab 03/06/18 0319  NA 139  K 2.5*  CL 104  CO2 25  GLUCOSE 136*  BUN 8  CREATININE 0.70  CALCIUM 8.5*  MG 1.6*  AST 21  ALT 16  ALKPHOS 88  BILITOT 0.5   ------------------------------------------------------------------------------------------------------------------  Cardiac Enzymes No results for input(s): TROPONINI in the last 168 hours. ------------------------------------------------------------------------------------------------------------------  RADIOLOGY:  Dg Chest Portable 1 View  Result Date: 03/06/2018 CLINICAL DATA:  Central line placement. EXAM: PORTABLE CHEST 1 VIEW COMPARISON:  Earlier this day at 0317 hour FINDINGS: Tip of the right subclavian central venous catheter in the proximal  SVC. No pneumothorax. Endotracheal and enteric tubes remain in place. Unchanged heart size and mediastinal contours. Mild bibasilar atelectasis. No pleural fluid. IMPRESSION: 1. Tip of the right subclavian central venous catheter in the proximal SVC. No pneumothorax. 2. Endotracheal and enteric tubes remain in place. 3. Persistent low lung volumes with bibasilar atelectasis. Electronically Signed   By: Narda Rutherford M.D.   On: 03/06/2018 05:44   Dg Chest Port 1 View  Result Date: 03/06/2018 CLINICAL DATA:  Heroin overdose. Endotracheal and NG tube placement. EXAM: PORTABLE CHEST 1 VIEW COMPARISON:  Chest radiograph 03/22/2017 FINDINGS: Endotracheal tube tip 17 mm from the carina. Enteric tube in place with tip and side-port below the diaphragm in the stomach. Lung volumes are low with mild bibasilar atelectasis. Normal heart size and mediastinal contours. No pleural fluid, pneumothorax or confluent airspace disease. No acute osseous abnormalities. IMPRESSION: 1. Endotracheal tube tip 17 mm from the carina. Enteric tube in place with tip and side-port below the diaphragm. 2. Low lung volumes with bibasilar atelectasis. Electronically Signed   By: Narda Rutherford M.D.   On: 03/06/2018 04:00   IMPRESSION AND PLAN:   A/P: 44F overdose (Seroquel +/- ?). Hypokalemia, hypomagnesemia, hypocalcemia, lactate elevation, macrocytic anemia. -Overdose: Pt witnessed to have taken Seroquel. Unclear if other medications/agents were ingested. UTox (+) Benzo, opiate, TCA (likely positive due to muscle relaxants). EtOH, ASA and Tylenol levels normal. Cardiac monitoring, pulse oximetry. IVF. QTc prolonged, monitor. Holding home meds. Psychiatry consult once improved. -Hypokalemia, hypomagnesemia, hypocalcemia: Replete K+ and Mag. Ionized calcium. -Lactate elevation: Lactate 3.2, improved to 1.2 w/ IVF. -Macrocytic anemia: Hgb 6.1, MCV 101.5. Ordered for pRBC txfn in ED. B12, Folate, heavy metal panel pending. -Hold  home meds as above. -FEN/GI: NPO for now. -DVT PPx: Lovenox. -Code status: Full code. -Disposition: Admission, > 2 midnights.   All the records are reviewed and case discussed with ED provider. Management plans discussed with the patient, family and they are in agreement.  CODE STATUS: Full code.  TOTAL TIME TAKING CARE OF THIS PATIENT: 75 minutes.    Barbaraann Rondo M.D on 03/06/2018 at 9:01 AM  Between 7am to 6pm - Pager - [phone]  After 6pm go to [email or web address] - Social research officer, government  [facility] [place] [facility]  Office  [phone]  CC: Primary care physician; Pearson Grippe, MD   Note: This dictation was prepared with Dragon dictation along with smaller phrase technology. Any transcriptional errors that result from this process are unintentional.

## 2018-03-06 NOTE — ED Notes (Signed)
Pt unable to maintain airway at this time. Intubation initiated by MD.

## 2018-03-06 NOTE — ED Triage Notes (Addendum)
Pt arrived via EMS from [facility] due to being found by friend with snoring respirations. Friend reported that pt took Seroquel x4 to sleep. Pt is combative, unresponsive with snoring respirations on arrival to ED. Pt received narcan in route, with no response.

## 2018-03-06 NOTE — Progress Notes (Signed)
Initial Nutrition Assessment  DOCUMENTATION CODES:   Not applicable  INTERVENTION:  Plan is to hold off on initiation of enteral nutrition today.  If patient expected to remain intubated >24-48 hours recommend initiating Vital AF 1.2 at 50 mL/hr (1200 mL goal daily volume) via OGT. Provides 1440 kcal, 90 grams of protein, 972 mL H2O daily.  If tube feeds are initiated provide liquid MVI daily per tube and free water flush of 30 mL Q4hrs to maintain tube patency.  NUTRITION DIAGNOSIS:   Inadequate oral intake related to inability to eat as evidenced by NPO status.  GOAL:   Provide needs based on ASPEN/SCCM guidelines  MONITOR:   Vent status, Labs, Weight trends, TF tolerance, I & O's  REASON FOR ASSESSMENT:   Ventilator    ASSESSMENT:   47 year old female with PMHx of anxiety, depression, arthritis, GERD, fibromyalgia, hx ACDF in 2011, chronic pain who is admitted after overdose and intubated on 10/24 for airway protection.   Patient intubated and sedated. On PRVC mode with FiO2 28% and PEEP 5 cmH2O. Abdomen soft. Weight looks stable in chart. Patient is currently 58.1 kg (128.09 lbs).  Enteral Access: 18 Fr. OGT placed 10/24; terminates in stomach per chest x-ray 10/24; 55 cm at corner of mouth  MAP: 69-92 mmHg  Patient is currently intubated on ventilator support Ve: 7.4 L/min Temp (24hrs), Avg:97.9 F (36.6 C), Min:93.7 F (34.3 C), Max:100 F (37.8 C)  Propofol: N/A  Medications reviewed and include: pantoprazole, Unasyn, fentanyl gtt, Versed gtt.  Labs reviewed.  I/O: 700 mL UOP so far this shift  Patient does not meet criteria for malnutrition at this time.  Discussed with RN and on rounds. Plan is to hold off on starting tube feeds today.  NUTRITION - FOCUSED PHYSICAL EXAM:    Most Recent Value  Orbital Region  No depletion  Upper Arm Region  No depletion  Thoracic and Lumbar Region  No depletion  Buccal Region  Unable to assess  Temple Region   No depletion  Clavicle Bone Region  No depletion  Clavicle and Acromion Bone Region  No depletion  Scapular Bone Region  Unable to assess  Dorsal Hand  No depletion  Patellar Region  No depletion  Anterior Thigh Region  No depletion  Posterior Calf Region  No depletion  Edema (RD Assessment)  None  Hair  Reviewed  Eyes  Unable to assess  Mouth  Unable to assess  Skin  Reviewed  Nails  Reviewed     Diet Order:   Diet Order            Diet NPO time specified  Diet effective now              EDUCATION NEEDS:   No education needs have been identified at this time  Skin:  Skin Assessment: Reviewed RN Assessment  Last BM:  Unknown/PTA  Height:   Ht Readings from Last 1 Encounters:  03/06/18 5\' 4"  (1.626 m)    Weight:   Wt Readings from Last 1 Encounters:  03/06/18 58.1 kg    Ideal Body Weight:  54.5 kg  BMI:  Body mass index is 21.99 kg/m.  Estimated Nutritional Needs:   Kcal:  1486 (PSU 2003b w/ MSJ 1204, Ve 7.4, Tmax 37.8)  Protein:  70-90 grams (1.2-1.5 grams/kg)  Fluid:  1.7-2 L/day (30-35 mL/kg)  Helane Rima, MS, RD, LDN Office: [phone] Pager: [phone] After Hours/Weekend Pager: [phone]

## 2018-03-06 NOTE — Progress Notes (Signed)
MEDICATION RELATED CONSULT NOTE - INITIAL  Pharmacy consulted to assist in monitoring and replacing electrolytes in this 47 y.o. female admitted on 03/06/2018 with overdose on Seroquel. QTc was 586 upon admission; has decreased to 462.  Labs:  LastLabs     Sodium (mmol/L)  Date Value  03/06/2018 141  07/02/2013 139      Potassium (mmol/L)  Date Value  03/06/2018 4.0  07/02/2013 2.8 (L)      Magnesium (mg/dL)  Date Value  16/02/9603 2.0      Phosphorus (mg/dL)  Date Value  54/01/8118 3.3      Calcium (mg/dL)  Date Value  14/78/2956 8.1 (L)      Calcium, Total (mg/dL)  Date Value  21/30/8657 8.7      Albumin (g/dL)  Date Value  84/69/6295 3.6  07/02/2013 3.6      Assessment/Plan: Goal electrolytes: K = 4.0 and Mg = 2.0.  Patient received KCl 10 mEq IV x 6, and Magnesium 2 g IV.   BMP is ordered q8h; will continue to monitor.  10/24 @ 20:30 :  All electrolytes WNL .  Will recheck electrolytes on 10/25 @ 0500.    Sofija Antwi D 03/06/2018,9:17 PM

## 2018-03-06 NOTE — Progress Notes (Signed)
[facility] - [place] at [facility]   PATIENT NAME: Monique Castro    MR#:  [id number]  DATE OF BIRTH:  February 17, 1971  SUBJECTIVE:  CHIEF COMPLAINT: pt is  Intubated.  No family members at bedside  REVIEW OF SYSTEMS:   unobtainable as the patient is intubated DRUG ALLERGIES:   Allergies  Allergen Reactions  . Sulfa Antibiotics Rash  . Amoxicillin-Pot Clavulanate Diarrhea    Has patient had a PCN reaction causing immediate rash, facial/tongue/throat swelling, SOB or lightheadedness with hypotension: No Has patient had a PCN reaction causing severe rash involving mucus membranes or skin necrosis: No Has patient had a PCN reaction that required hospitalization: No Has patient had a PCN reaction occurring within the last 10 years: #  #  #  YES  #  #  #  If all of the above answers are "NO", then may proceed with Cephalosporin use.   Marland Kitchen Doxycycline Nausea And Vomiting    GI Upset  . Macrobid [Nitrofurantoin Macrocrystal] Nausea Only    VITALS:  Blood pressure 96/68, pulse (!) 106, temperature 99.9 F (37.7 C), resp. rate 20, height 5\' 4"  (1.626 m), weight 58.1 kg, SpO2 99 %.  PHYSICAL EXAMINATION:  GENERAL:  47 y.o.-year-old patient lying in the bed with no acute distress.  EYES: Pupils equal, round, reactive to light and accommodation. No scleral icterus. Marland Kitchen  HEENT: Head atraumatic, normocephalic. Oropharynx ET tube NECK:  Supple, no jugular venous distention. No thyroid enlargement, no tenderness.  LUNGS: Normal breath sounds bilaterally, no wheezing, rales,rhonchi or crepitation. No use of accessory muscles of respiration.  CARDIOVASCULAR: S1, S2 normal. No murmurs, rubs, or gallops.  ABDOMEN: Soft, nontender, nondistended. Bowel sounds present. EXTREMITIES: No pedal edema, cyanosis, or clubbing.  NEUROLOGIC: Sedated sensation intact. Gait not checked.  PSYCHIATRIC: The patient is sedated SKIN: No obvious rash, lesion, or ulcer.    LABORATORY PANEL:    CBC Recent Labs  Lab 03/06/18 0319 03/06/18 1147  WBC 4.7  --   HGB 6.1* 11.8*  HCT 20.0* 36.2  PLT 176  --    ------------------------------------------------------------------------------------------------------------------  Chemistries  Recent Labs  Lab 03/06/18 0319 03/06/18 1147  NA 139 141  K 2.5* 4.0  CL 104 106  CO2 25 28  GLUCOSE 136* 92  BUN 8 6  CREATININE 0.70 0.74  CALCIUM 8.5* 8.1*  MG 1.6* 2.0  AST 21  --   ALT 16  --   ALKPHOS 88  --   BILITOT 0.5  --    ------------------------------------------------------------------------------------------------------------------  Cardiac Enzymes No results for input(s): TROPONINI in the last 168 hours. ------------------------------------------------------------------------------------------------------------------  RADIOLOGY:  Dg Chest Port 1 View  Result Date: 03/06/2018 CLINICAL DATA:  Central line placement. EXAM: PORTABLE CHEST 1 VIEW COMPARISON:  Chest x-ray from same day at 5:23 a.m. FINDINGS: The patient is rotated to the left. Tip of the right subclavian central venous catheter unchanged in position in the proximal SVC. There is mild redundancy of the catheter within the right subclavian vein. Unchanged positioning of the endotracheal and enteric tubes. Stable cardiomediastinal silhouette. No focal consolidation, pleural effusion, or pneumothorax. No acute osseous abnormality. IMPRESSION: 1. Unchanged positioning of the right subclavian central venous catheter with the tip in the proximal SVC. New mild redundancy of the catheter within the right subclavian vein. Electronically Signed   By: Obie Dredge M.D.   On: 03/06/2018 11:25   Dg Chest Portable 1 View  Result Date: 03/06/2018 CLINICAL DATA:  Central line placement.  EXAM: PORTABLE CHEST 1 VIEW COMPARISON:  Earlier this day at 0317 hour FINDINGS: Tip of the right subclavian central venous catheter in the proximal SVC. No pneumothorax. Endotracheal  and enteric tubes remain in place. Unchanged heart size and mediastinal contours. Mild bibasilar atelectasis. No pleural fluid. IMPRESSION: 1. Tip of the right subclavian central venous catheter in the proximal SVC. No pneumothorax. 2. Endotracheal and enteric tubes remain in place. 3. Persistent low lung volumes with bibasilar atelectasis. Electronically Signed   By: Narda Rutherford M.D.   On: 03/06/2018 05:44   Dg Chest Port 1 View  Result Date: 03/06/2018 CLINICAL DATA:  Heroin overdose. Endotracheal and NG tube placement. EXAM: PORTABLE CHEST 1 VIEW COMPARISON:  Chest radiograph 03/22/2017 FINDINGS: Endotracheal tube tip 17 mm from the carina. Enteric tube in place with tip and side-port below the diaphragm in the stomach. Lung volumes are low with mild bibasilar atelectasis. Normal heart size and mediastinal contours. No pleural fluid, pneumothorax or confluent airspace disease. No acute osseous abnormalities. IMPRESSION: 1. Endotracheal tube tip 17 mm from the carina. Enteric tube in place with tip and side-port below the diaphragm. 2. Low lung volumes with bibasilar atelectasis. Electronically Signed   By: Narda Rutherford M.D.   On: 03/06/2018 04:00    EKG:   Orders placed or performed during the hospital encounter of 03/06/18  . EKG 12-Lead  . EKG 12-Lead  . EKG 12-Lead  . EKG 12-Lead  . EKG 12-Lead  . EKG 12-Lead    ASSESSMENT AND PLAN:    #Acute encephalopathy secondary to altered mental status Currently intubated Intensivist is following   #Drug overdose not sure whether it is intentional/ unintentional Need psych evaluation once patient is extubated  #History of anxiety/depression Currently intubated and sedated needs psych evaluation  #Hypokalemia/hypomagnesemia repleted  #Chronic pain syndrome with history of fibromyalgia  #GERD PPI  All the records are reviewed and case discussed with Care Management/Social Workerr. Management plans discussed with the patient,  family and they are in agreement.  CODE STATUS: fc   TOTAL TIME TAKING CARE OF THIS PATIENT: 35  minutes.   POSSIBLE D/C IN  ?  DAYS, DEPENDING ON CLINICAL CONDITION.  Note: This dictation was prepared with Dragon dictation along with smaller phrase technology. Any transcriptional errors that result from this process are unintentional.   Ramonita Lab M.D on 03/06/2018 at 2:33 PM  Between 7am to 6pm - Pager - [phone] After 6pm go to [email or web address] - password [email or web address] [facility]  [place] [facility]  Office  [phone]  CC: Primary care physician; Pearson Grippe, MD

## 2018-03-06 NOTE — ED Provider Notes (Signed)
[facility] Emergency Department Provider Note  ____________________________________________   First MD Initiated Contact with Patient 03/06/18 [phone]     (approximate)  I have reviewed the triage vital signs and the nursing notes.   HISTORY  Chief Complaint Ingestion  Level 5 caveat:  history/ROS limited by acute/critical illness  HPI Monique Castro is a 47 y.o. female with medical history as listed below who presents with severely altered mental status by EMS after suspected overdose.  History is very limited.  Reportedly a friend found her altered with multiple empty are mostly empty bottles of pills near her.  Whether or not the overdose was intentional is unknown.  She is altered, unable to follow commands, tachycardic in the 150s, intermittently unresponsive and agitated and combative.  See nursing notes for additional details regarding medications, but it appears she could have taken some combination of Vyvanse, Seroquel, Percocet, Flagyl, and possibly other medications as well.    Past Medical History:  Diagnosis Date  . Allergy   . Anxiety   . Arthritis   . Depression   . Dysrhythmia    diagnosed with tachycardia in but not severe enough to require medications-per patient  . Fibromyalgia   . GERD (gastroesophageal reflux disease)   . Neuromuscular disorder (HCC)   . Strep throat     Patient Active Problem List   Diagnosis Date Noted  . Overdose 03/06/2018  . S/P lumbar spinal fusion 03/27/2017  . Epicondylitis 12/29/2015  . Epicondylitis syndrome of elbow 02/03/2015  . DDD (degenerative disc disease), lumbar 12/21/2014  . Facet syndrome, lumbar 12/21/2014  . Lumbar radiculopathy 12/21/2014  . Sacroiliac joint dysfunction 12/21/2014  . Fast heart beat 05/04/2014  . Arthropathy of lumbar facet joint 01/28/2014  . Adaptive colitis 08/11/2013  . Neuropathy 07/14/2013  . Interdigital neuralgia 06/17/2013  . Clinical depression 06/03/2013   . Fibrositis 04/18/2012    Past Surgical History:  Procedure Laterality Date  . ABDOMINAL HYSTERECTOMY    . APPENDECTOMY    . BACK SURGERY     ACDF 2011  . TUBAL LIGATION      Prior to Admission medications   Medication Sig Start Date End Date Taking? Authorizing Provider  buPROPion (WELLBUTRIN XL) 300 MG 24 hr tablet Take 300 mg by mouth daily.  01/12/15  Yes [provider]  desvenlafaxine (PRISTIQ) 50 MG 24 hr tablet Take 50 mg every evening by mouth.    Yes [provider]  hydrOXYzine (VISTARIL) 25 MG capsule Take 1 capsule by mouth 2 (two) times daily as needed for anxiety. 02/12/18  Yes [provider]  lisdexamfetamine (VYVANSE) 50 MG capsule Take 100 mg daily by mouth.   Yes [provider]  oxyCODONE 20 MG TABS Take 1 tablet (20 mg total) every 4 (four) hours as needed by mouth for severe pain ((score 7 to 10)). 03/29/17  Yes Donalee Citrin, MD  Oxycodone HCl 20 MG TABS Take 20 mg by mouth every 4 (four) hours as needed (severe pain). (score 7 to 10)   Yes [provider]  pantoprazole (PROTONIX) 40 MG tablet Take 40 mg every evening by mouth. Reported on 08/09/2015 10/18/14  Yes [provider]  QUEtiapine (SEROQUEL) 50 MG tablet Take 1 tablet by mouth at bedtime. 02/12/18  Yes [provider]  tiZANidine (ZANAFLEX) 4 MG tablet Take 1 tablet (4 mg total) at bedtime by mouth. 03/29/17  Yes Donalee Citrin, MD  buPROPion (WELLBUTRIN XL) 300 MG 24 hr tablet  Take 300 mg every evening by mouth.  01/12/15 03/20/17  [provider]  cetirizine-pseudoephedrine (ZYRTEC-D ALLERGY & CONGESTION) 5-120 MG tablet Take 1 tablet daily as needed by mouth for allergies. Reported on 11/30/2015 10/07/07   [provider]  cholestyramine Lanetta Inch) 4 GM/DOSE powder Take by mouth. 07/08/13   [provider]  clotrimazole-betamethasone (LOTRISONE) cream APP EXT AA BID 06/24/17   [provider]  diclofenac sodium  (VOLTAREN) 1 % GEL Apply 2 g daily as needed topically (pain).    [provider]  oxymorphone (OPANA) 10 MG tablet Limit 1 tab by mouth 3 - 5 times per day if tolerated Patient not taking: Reported on 03/20/2017 12/29/15   Ewing Schlein, MD  simvastatin (ZOCOR) 10 MG tablet Take 10 mg every evening by mouth.    [provider]  tamsulosin (FLOMAX) 0.4 MG CAPS capsule Take 0.4 mg at bedtime by mouth.    [provider]  triamcinolone (NASACORT) 55 MCG/ACT AERO nasal inhaler Place 1 spray daily as needed into the nose (allergies). Reported on 07/11/2015    [provider]    Allergies Sulfa antibiotics; Amoxicillin-pot clavulanate; Doxycycline; and Macrobid [nitrofurantoin macrocrystal]  Family History  Problem Relation Age of Onset  . Alcohol abuse Father     Social History Social History   Tobacco Use  . Smoking status: Current Every Day Smoker    Packs/day: 0.50    Types: Cigarettes  . Smokeless tobacco: Never Used  Substance Use Topics  . Alcohol use: Yes    Alcohol/week: 0.0 standard drinks    Comment: rarely  . Drug use: No    Review of Systems Level 5 caveat:  history/ROS limited by acute/critical illness ____________________________________________   PHYSICAL EXAM:  ED Triage Vitals [03/06/18 0330]  Enc Vitals Group     BP (!) 135/91     Pulse Rate (!) 151     Resp (!) 33     Temp 97.9 rectal     Temp src      SpO2 100 %     Weight      Height      Head Circumference      Peak Flow      Pain Score      Pain Loc      Pain Edu?      Excl. in GC?      Constitutional: Severely altered mental status, unable to respond, intermittently agitated and unresponsive Eyes: Conjunctivae are normal.  Sluggish but equally reactive pupils Head: Atraumatic. Nose: No congestion/rhinnorhea. Mouth/Throat: Mucous membranes are dry Neck: No stridor.  No meningeal signs.   Cardiovascular: Moderate tachycardia, regular rhythm. Good  peripheral circulation. Grossly normal heart sounds. Respiratory: Increased respiratory rate and effort but with clear lung sounds throughout Gastrointestinal: Soft and nontender. No distention.  GU: Light brown stool per rectum, Hemoccult negative.  ED chaperone present during exam. Musculoskeletal: No lower extremity tenderness nor edema. No gross deformities of extremities. Neurologic: Moving all 4 extremities but unable to participate in neurological exam Skin:  Skin is warm, dry and intact. No rash noted.   ____________________________________________   LABS (all labs ordered are listed, but only abnormal results are displayed)  Labs Reviewed  ACETAMINOPHEN LEVEL - Abnormal; Notable for the following components:      Result Value   Acetaminophen (Tylenol), Serum <10 (*)    All other components within normal limits  COMPREHENSIVE METABOLIC PANEL - Abnormal; Notable for the following components:   Potassium  2.5 (*)    Glucose, Bld 136 (*)    Calcium 8.5 (*)    All other components within normal limits  CBC WITH DIFFERENTIAL/PLATELET - Abnormal; Notable for the following components:   RBC 1.97 (*)    Hemoglobin 6.1 (*)    HCT 20.0 (*)    MCV 101.5 (*)    All other components within normal limits  URINE DRUG SCREEN, QUALITATIVE ([facility] ONLY) - Abnormal; Notable for the following components:   Tricyclic, Ur Screen POSITIVE (*)    Opiate, Ur Screen POSITIVE (*)    Benzodiazepine, Ur Scrn POSITIVE (*)    All other components within normal limits  BLOOD GAS, ARTERIAL - Abnormal; Notable for the following components:   pO2, Arterial 132 (*)    All other components within normal limits  LACTIC ACID, PLASMA - Abnormal; Notable for the following components:   Lactic Acid, Venous 3.2 (*)    All other components within normal limits  ETHANOL  SALICYLATE LEVEL  HCG, QUANTITATIVE, PREGNANCY  CK  PROTIME-INR  LACTIC ACID, PLASMA  URINALYSIS, ROUTINE W REFLEX MICROSCOPIC  POC URINE  PREG, ED  TYPE AND SCREEN  PREPARE RBC (CROSSMATCH)  ABO/RH   ____________________________________________  EKG  ED ECG REPORT I, Loleta Rose, the attending physician, personally viewed and interpreted this ECG.  Date: 03/06/2018 EKG Time: 3:19 AM Rate: 153 Rhythm: Sinus tachycardia QRS Axis: normal Intervals: Prolonged QTC at 586 ms, normal QRS ST/T Wave abnormalities: Non-specific ST segment / T-wave changes, but no evidence of acute ischemia. Narrative Interpretation: no evidence of acute ischemia   ____________________________________________  RADIOLOGY I, Loleta Rose, personally viewed and evaluated these images (plain radiographs) as part of my medical decision making, as well as reviewing the written report by the radiologist.  ED MD interpretation: Appropriately placed ET tube and right subclavian line  Official radiology report(s): Dg Chest Portable 1 View  Result Date: 03/06/2018 CLINICAL DATA:  Central line placement. EXAM: PORTABLE CHEST 1 VIEW COMPARISON:  Earlier this day at 0317 hour FINDINGS: Tip of the right subclavian central venous catheter in the proximal SVC. No pneumothorax. Endotracheal and enteric tubes remain in place. Unchanged heart size and mediastinal contours. Mild bibasilar atelectasis. No pleural fluid. IMPRESSION: 1. Tip of the right subclavian central venous catheter in the proximal SVC. No pneumothorax. 2. Endotracheal and enteric tubes remain in place. 3. Persistent low lung volumes with bibasilar atelectasis. Electronically Signed   By: Narda Rutherford M.D.   On: 03/06/2018 05:44   Dg Chest Port 1 View  Result Date: 03/06/2018 CLINICAL DATA:  Heroin overdose. Endotracheal and NG tube placement. EXAM: PORTABLE CHEST 1 VIEW COMPARISON:  Chest radiograph 03/22/2017 FINDINGS: Endotracheal tube tip 17 mm from the carina. Enteric tube in place with tip and side-port below the diaphragm in the stomach. Lung volumes are low with mild bibasilar  atelectasis. Normal heart size and mediastinal contours. No pleural fluid, pneumothorax or confluent airspace disease. No acute osseous abnormalities. IMPRESSION: 1. Endotracheal tube tip 17 mm from the carina. Enteric tube in place with tip and side-port below the diaphragm. 2. Low lung volumes with bibasilar atelectasis. Electronically Signed   By: Narda Rutherford M.D.   On: 03/06/2018 04:00    ____________________________________________   PROCEDURES  Critical Care performed: Yes, see critical care procedure note(s)   Procedure(s) performed:   .Central Line Date/Time: 03/06/2018 6:08 AM Performed by: Loleta Rose, MD Authorized by: Loleta Rose, MD   Consent:    Consent obtained:  Emergent  situation Pre-procedure details:    Hand hygiene: Hand hygiene performed prior to insertion     Sterile barrier technique: All elements of maximal sterile technique followed     Skin preparation:  2% chlorhexidine   Skin preparation agent: Skin preparation agent completely dried prior to procedure   Sedation:    Sedation type: intubated and sedated. Procedure details:    Location:  R subclavian   Patient position:  Trendelenburg   Procedural supplies:  Triple lumen   Landmarks identified: yes     Ultrasound guidance: no     Number of attempts:  1   Successful placement: yes   Post-procedure details:    Post-procedure:  Dressing applied and line sutured   Assessment:  Blood return through all ports, free fluid flow and placement verified by x-ray   Patient tolerance of procedure:  Tolerated well, no immediate complications .Critical Care Performed by: Loleta Rose, MD Authorized by: Loleta Rose, MD   Critical care provider statement:    Critical care time (minutes):  75   Critical care time was exclusive of:  Separately billable procedures and treating other patients   Critical care was necessary to treat or prevent imminent or life-threatening deterioration of the following  conditions:  Toxidrome   Critical care was time spent personally by me on the following activities:  Development of treatment plan with patient or surrogate, discussions with consultants, evaluation of patient's response to treatment, examination of patient, obtaining history from patient or surrogate, ordering and performing treatments and interventions, ordering and review of laboratory studies, ordering and review of radiographic studies, pulse oximetry, re-evaluation of patient's condition and review of old charts Procedure Name: Intubation Date/Time: 03/06/2018 6:09 AM Performed by: Loleta Rose, MD Pre-anesthesia Checklist: Patient identified, Emergency Drugs available, Suction available and Patient being monitored Preoxygenation: Pre-oxygenation with 100% oxygen Induction Type: IV induction and Rapid sequence Laryngoscope Size: Glidescope Tube size: 7.5 mm Number of attempts: 1 Placement Confirmation: ETT inserted through vocal cords under direct vision,  CO2 detector and Breath sounds checked- equal and bilateral Secured at: 22 cm Tube secured with: ETT holder Dental Injury: Teeth and Oropharynx as per pre-operative assessment         ____________________________________________   INITIAL IMPRESSION / ASSESSMENT AND PLAN / ED COURSE  As part of my medical decision making, I reviewed the following data within the electronic MEDICAL RECORD NUMBER Nursing notes reviewed and incorporated, Labs reviewed , EKG interpreted , Old chart reviewed, Radiograph reviewed  and Discussed with admitting physician     Documentation was significantly delayed due to the critical nature of this patient as well as multiple other critical patients for whom I was caring simultaneously.  In short, the patient is altered no signs of unclear unspecified toxidrome but altered, tachycardic, concern for airway protection, and prolonged QTc interval.  Anticholinergics are certainly possible.  Other than her  tachycardia her vital signs are stable and she is afebrile.  For her airway protection and for safe treatment of the patient, I elected to intubate her emergently which went smoothly as documented above.  See nursing notes for details but I provided 2 L of normal saline bolus, 1 ampoule of sodium bicarb given my concern for possible EKG changes and anticholinergic overdose, magnesium 2 g IV and calcium gluconate 1 g IV for cardiac stabilization, potassium chloride 10 mEq IV with plans to repeat hourly for a potassium of 2.5.  I have also ordered 2 units of packed red blood cells  to be crossmatched for what appears to be a relatively acute hemoglobin of about 6 when her last hemoglobin on record in November of last year (about 1 year ago, was normal.  We have no obvious source of bleeding at this time.  She is being maintained on Versed and fentanyl drips after the intubation for which she received etomidate and rocuronium.  ABG was generally unremarkable which was reassuring.  Lactic acid is elevated at 3.2 but I do not believe this represents sepsis, but rather represents her toxic overdose and her respiratory status.  Pregnancy test is negative, urine drug screen positive for tricyclics, opiates, and benzodiazepines, CMP normal except for the potassium as documented above, CK and coagulation studies are normal. Given my concern that the patient may decompensate and that I knew there was going to be delayed getting her to the ICU due to patient population and staffing difficulties, I placed a right subclavian central line in the emergency department under sterile procedures.  The patient has no sign of infection at this time although urinalysis is still pending.  I will discuss the case with the hospitalist for admission.  According to the friend who called EMS and he came to the hospital, the patient is estranged from all of her family and no family is available.    Clinical Course as of Mar 06 620    Thu Mar 06, 2018  0543 Discussed case in person with hospitalist for admission.   [CF]    Clinical Course User Index [CF] Loleta Rose, MD    ____________________________________________  FINAL CLINICAL IMPRESSION(S) / ED DIAGNOSES  Final diagnoses:  Acute respiratory failure, unspecified whether with hypoxia or hypercapnia (HCC)  Drug overdose, undetermined intent, initial encounter  Sinus tachycardia  Prolonged QT interval  Hypokalemia  Anemia, unspecified type  Altered mental status, unspecified altered mental status type  Elevated lactic acid level     MEDICATIONS GIVEN DURING THIS VISIT:  Medications  midazolam (VERSED) 50 mg in sodium chloride 0.9 % 50 mL (1 mg/mL) infusion (2 mg/hr Intravenous Rate/Dose Change 03/06/18 0611)  fentaNYL in NS (6mcg/ml) infusion-PREMIX (150 mcg/hr Intravenous New Bag/Given 03/06/18 0345)  potassium chloride 10 mEq in 100 mL IVPB (0 mEq Intravenous Stopped 03/06/18 0617)  0.9 %  sodium chloride infusion (has no administration in time range)  midazolam (VERSED) 5 MG/5ML injection 5 mg (5 mg Intravenous Given 03/06/18 0315)  etomidate (AMIDATE) injection 0.3 mg/kg ( Intravenous Given 03/06/18 0330)  rocuronium (ZEMURON) injection 1 mg/kg ( Intravenous Given 03/06/18 0330)  sodium chloride 0.9 % bolus 1,000 mL (0 mLs Intravenous Stopped 03/06/18 0445)  magnesium sulfate IVPB 2 g 50 mL (0 g Intravenous Stopped 03/06/18 0549)  calcium gluconate 1 g in sodium chloride 0.9 % 100 mL IVPB (0 g Intravenous Stopped 03/06/18 0550)  sodium bicarbonate injection 50 mEq (50 mEq Intravenous Given 03/06/18 0602)     ED Discharge Orders    None       Note:  This document was prepared using Dragon voice recognition software and may include unintentional dictation errors.    Loleta Rose, MD 03/06/18 [phone]

## 2018-03-07 ENCOUNTER — Encounter: Payer: Self-pay | Admitting: Psychiatry

## 2018-03-07 ENCOUNTER — Other Ambulatory Visit: Payer: Self-pay

## 2018-03-07 ENCOUNTER — Inpatient Hospital Stay: Payer: BLUE CROSS/BLUE SHIELD

## 2018-03-07 DIAGNOSIS — Z79899 Other long term (current) drug therapy: Secondary | ICD-10-CM

## 2018-03-07 DIAGNOSIS — F1310 Sedative, hypnotic or anxiolytic abuse, uncomplicated: Secondary | ICD-10-CM

## 2018-03-07 HISTORY — DX: Sedative, hypnotic or anxiolytic abuse, uncomplicated: F13.10

## 2018-03-07 LAB — CBC WITH DIFFERENTIAL/PLATELET
Abs Immature Granulocytes: 0.06 10*3/uL (ref 0.00–0.07)
BASOS ABS: 0.1 10*3/uL (ref 0.0–0.1)
BASOS PCT: 0 %
EOS ABS: 0.1 10*3/uL (ref 0.0–0.5)
Eosinophils Relative: 1 %
HCT: 39.1 % (ref 36.0–46.0)
Hemoglobin: 12.7 g/dL (ref 12.0–15.0)
Immature Granulocytes: 1 %
Lymphocytes Relative: 21 %
Lymphs Abs: 2.3 10*3/uL (ref 0.7–4.0)
MCH: 30.8 pg (ref 26.0–34.0)
MCHC: 32.5 g/dL (ref 30.0–36.0)
MCV: 94.9 fL (ref 80.0–100.0)
Monocytes Absolute: 1 10*3/uL (ref 0.1–1.0)
Monocytes Relative: 9 %
NRBC: 0 % (ref 0.0–0.2)
Neutro Abs: 7.6 10*3/uL (ref 1.7–7.7)
Neutrophils Relative %: 68 %
PLATELETS: 272 10*3/uL (ref 150–400)
RBC: 4.12 MIL/uL (ref 3.87–5.11)
RDW: 14.3 % (ref 11.5–15.5)
WBC: 11.1 10*3/uL — AB (ref 4.0–10.5)

## 2018-03-07 LAB — TYPE AND SCREEN
ABO/RH(D): A POS
Antibody Screen: NEGATIVE
Unit division: 0
Unit division: 0

## 2018-03-07 LAB — BPAM RBC
Blood Product Expiration Date: 201911222359
Blood Product Expiration Date: 201911222359
ISSUE DATE / TIME: 201910240731
ISSUE DATE / TIME: 201910241210
UNIT TYPE AND RH: 6200
Unit Type and Rh: 6200

## 2018-03-07 LAB — BASIC METABOLIC PANEL
Anion gap: 4 — ABNORMAL LOW (ref 5–15)
BUN: 7 mg/dL (ref 6–20)
CHLORIDE: 108 mmol/L (ref 98–111)
CO2: 28 mmol/L (ref 22–32)
CREATININE: 0.77 mg/dL (ref 0.44–1.00)
Calcium: 8.3 mg/dL — ABNORMAL LOW (ref 8.9–10.3)
GFR calc Af Amer: >60 mL/min (ref >60)
GFR calc non Af Amer: >60 mL/min (ref >60)
Glucose, Bld: 99 mg/dL (ref 70–99)
Potassium: 3.4 mmol/L — ABNORMAL LOW (ref 3.5–5.1)
Sodium: 140 mmol/L (ref 135–145)

## 2018-03-07 LAB — HEAVY METALS, BLOOD
Arsenic: 6 ug/L (ref 2–23)
Lead: NOT DETECTED ug/dL (ref 0–4)
MERCURY: NOT DETECTED ug/L (ref 0.0–14.9)

## 2018-03-07 LAB — CALCIUM, IONIZED: Calcium, Ionized, Serum: 4.7 mg/dL (ref 4.5–5.6)

## 2018-03-07 LAB — POCT PREGNANCY, URINE: PREG TEST UR: NEGATIVE

## 2018-03-07 LAB — MAGNESIUM: Magnesium: 2 mg/dL (ref 1.7–2.4)

## 2018-03-07 LAB — PHOSPHORUS: PHOSPHORUS: 2.2 mg/dL — AB (ref 2.5–4.6)

## 2018-03-07 MED ORDER — PANTOPRAZOLE SODIUM 20 MG PO TBEC
20.0000 mg | DELAYED_RELEASE_TABLET | Freq: Two times a day (BID) | ORAL | Status: DC
  Administered 2018-03-08: 20 mg via ORAL
  Filled 2018-03-07 (×2): qty 1

## 2018-03-07 MED ORDER — PHENOL 1.4 % MT LIQD
1.0000 | OROMUCOSAL | Status: DC | PRN
  Filled 2018-03-07: qty 177

## 2018-03-07 MED ORDER — POTASSIUM & SODIUM PHOSPHATES 280-160-250 MG PO PACK
1.0000 | PACK | Freq: Three times a day (TID) | ORAL | Status: AC
  Administered 2018-03-07 (×2): 1 via ORAL
  Filled 2018-03-07 (×2): qty 1

## 2018-03-07 MED ORDER — ADULT MULTIVITAMIN W/MINERALS CH
1.0000 | ORAL_TABLET | Freq: Every day | ORAL | Status: DC
  Administered 2018-03-07 – 2018-03-08 (×2): 1 via ORAL
  Filled 2018-03-07 (×2): qty 1

## 2018-03-07 MED ORDER — POTASSIUM CHLORIDE CRYS ER 20 MEQ PO TBCR
40.0000 meq | EXTENDED_RELEASE_TABLET | ORAL | Status: DC
  Administered 2018-03-07: 40 meq via ORAL
  Filled 2018-03-07: qty 2

## 2018-03-07 MED ORDER — CHLORHEXIDINE GLUCONATE 0.12 % MT SOLN
OROMUCOSAL | Status: AC
  Administered 2018-03-07: 08:00:00
  Filled 2018-03-07: qty 15

## 2018-03-07 MED ORDER — ENSURE ENLIVE PO LIQD: 237.0000 mL | Freq: Two times a day (BID) | ORAL | Status: DC

## 2018-03-07 NOTE — Consult Note (Signed)
Pharmacy Antibiotic Note  Monique Castro is a 47 y.o. female admitted on 03/06/2018 with pneumonia.  Pharmacy has been consulted for Unasyn dosing. Patient was admitted for Seroquel overdose. Patient has Augmentin allergy, but is only listed as diarrhea. Patient is intubated secondary to overdose. Covering with Unasyn for potential aspiration PNA.  Plan: Patient extubated. Will continue Unasyn 3g IV Q6hr for planned duration of 5 days.   Height: 5\' 4"  (162.6 cm) Weight: 126 lb 8.7 oz (57.4 kg) IBW/kg (Calculated) : 54.7  Temp (24hrs), Avg:99.9 F (37.7 C), Min:98.6 F (37 C), Max:101.5 F (38.6 C)  Recent Labs  Lab 03/06/18 0319 03/06/18 0529 03/06/18 1147 03/06/18 2031 03/07/18 0404  WBC 4.7  --   --   --  11.1*  CREATININE 0.70  --  0.74 0.73 0.77  LATICACIDVEN 3.2* 1.2  --   --   --     Estimated Creatinine Clearance: 75.1 mL/min (by C-G formula based on SCr of 0.77 mg/dL).    Allergies  Allergen Reactions  . Sulfa Antibiotics Rash  . Amoxicillin-Pot Clavulanate Diarrhea    Has patient had a PCN reaction causing immediate rash, facial/tongue/throat swelling, SOB or lightheadedness with hypotension: No Has patient had a PCN reaction causing severe rash involving mucus membranes or skin necrosis: No Has patient had a PCN reaction that required hospitalization: No Has patient had a PCN reaction occurring within the last 10 years: #  #  #  YES  #  #  #  If all of the above answers are "NO", then may proceed with Cephalosporin use.   Marland Kitchen Doxycycline Nausea And Vomiting    GI Upset  . Macrobid [Nitrofurantoin Macrocrystal] Nausea Only    Antimicrobials this admission: 10/24 Unasyn >>    Dose adjustments this admission: N/A  Microbiology results:  10/24 MRSA PCR: negative   Thank you for allowing pharmacy to be a part of this patient's care.   Simpson,Michael L 03/07/2018 1:39 PM

## 2018-03-07 NOTE — Progress Notes (Signed)
Pt extubated without complications, no stridor noted, sats 98% on roomair, respiratory rate 18/min, HR 112, will continue to monitor

## 2018-03-07 NOTE — Progress Notes (Signed)
1445 Transferred to 217 via bed. Cleared of suicide precautions per Dr Toni Amend. Foley removed pre-transfer. Report called earlier to [employer] on 2 C.

## 2018-03-07 NOTE — Progress Notes (Signed)
[facility] - [place] at [facility]   PATIENT NAME: Monique Castro    MR#:  [id number]  DATE OF BIRTH:  1970-11-23  SUBJECTIVE:  CHIEF COMPLAINT: pt is  extubated.  Reports overdoses unintentional.  Got separated from the husband lately no family members at bedside  REVIEW OF SYSTEMS:   CONSTITUTIONAL: No fever, fatigue or weakness.  EYES: No blurred or double vision.  EARS, NOSE, AND THROAT: No tinnitus or ear pain.  RESPIRATORY: No cough,shortness of breath, denies wheezing or hemoptysis.  CARDIOVASCULAR: No chest pain, orthopnea, edema.  GASTROINTESTINAL: No nausea, vomiting, diarrhea or abdominal pain.  GENITOURINARY: No dysuria, hematuria.  ENDOCRINE: No polyuria, nocturia,  HEMATOLOGY: No anemia, easy bruising or bleeding SKIN: No rash or lesion. MUSCULOSKELETAL: No joint pain or arthritis.   NEUROLOGIC: No tingling, numbness, weakness.  PSYCHIATRY: No anxiety or depression.   DRUG ALLERGIES:   Allergies  Allergen Reactions  . Sulfa Antibiotics Rash  . Amoxicillin-Pot Clavulanate Diarrhea    Has patient had a PCN reaction causing immediate rash, facial/tongue/throat swelling, SOB or lightheadedness with hypotension: No Has patient had a PCN reaction causing severe rash involving mucus membranes or skin necrosis: No Has patient had a PCN reaction that required hospitalization: No Has patient had a PCN reaction occurring within the last 10 years: #  #  #  YES  #  #  #  If all of the above answers are "NO", then may proceed with Cephalosporin use.   Marland Kitchen Doxycycline Nausea And Vomiting    GI Upset  . Macrobid [Nitrofurantoin Macrocrystal] Nausea Only    VITALS:  Blood pressure 127/61, pulse (!) 113, temperature 99.5 F (37.5 C), temperature source Oral, resp. rate 18, height 5\' 4"  (1.626 m), weight 57.4 kg, SpO2 96 %.  PHYSICAL EXAMINATION:  GENERAL:  47 y.o.-year-old patient lying in the bed with no acute distress.  EYES: Pupils equal, round,  reactive to light and accommodation. No scleral icterus. Marland Kitchen  HEENT: Head atraumatic, normocephalic.  NECK:  Supple, no jugular venous distention. No thyroid enlargement, no tenderness.  LUNGS: Normal breath sounds bilaterally, no wheezing, rales,rhonchi or crepitation. No use of accessory muscles of respiration.  CARDIOVASCULAR: S1, S2 normal. No murmurs, rubs, or gallops.  ABDOMEN: Soft, nontender, nondistended. Bowel sounds present. EXTREMITIES: No pedal edema, cyanosis, or clubbing.  NEUROLOGIC: Sedated sensation intact. Gait not checked.  PSYCHIATRIC: The patient is sedated SKIN: No obvious rash, lesion, or ulcer.    LABORATORY PANEL:   CBC Recent Labs  Lab 03/07/18 0404  WBC 11.1*  HGB 12.7  HCT 39.1  PLT 272   ------------------------------------------------------------------------------------------------------------------  Chemistries  Recent Labs  Lab 03/06/18 0319  03/07/18 0404  NA 139   < > 140  K 2.5*   < > 3.4*  CL 104   < > 108  CO2 25   < > 28  GLUCOSE 136*   < > 99  BUN 8   < > 7  CREATININE 0.70   < > 0.77  CALCIUM 8.5*   < > 8.3*  MG 1.6*   < > 2.0  AST 21  --   --   ALT 16  --   --   ALKPHOS 88  --   --   BILITOT 0.5  --   --    < > = values in this interval not displayed.   ------------------------------------------------------------------------------------------------------------------  Cardiac Enzymes No results for input(s): TROPONINI in the last 168 hours. ------------------------------------------------------------------------------------------------------------------  RADIOLOGY:  Dg Chest Port 1 View  Result Date: 03/07/2018 CLINICAL DATA:  Atelectasis. EXAM: PORTABLE CHEST 1 VIEW COMPARISON:  Radiograph of March 06, 2018. FINDINGS: The heart size and mediastinal contours are within normal limits. Endotracheal and nasogastric tubes are unchanged in position. Right subclavian catheter is unchanged. Both lungs are clear. The visualized  skeletal structures are unremarkable. IMPRESSION: Stable support apparatus. No acute cardiopulmonary abnormality seen. Electronically Signed   By: Lupita Raider, M.D.   On: 03/07/2018 07:09   Dg Chest Port 1 View  Result Date: 03/06/2018 CLINICAL DATA:  Central line placement. EXAM: PORTABLE CHEST 1 VIEW COMPARISON:  Chest x-ray from same day at 5:23 a.m. FINDINGS: The patient is rotated to the left. Tip of the right subclavian central venous catheter unchanged in position in the proximal SVC. There is mild redundancy of the catheter within the right subclavian vein. Unchanged positioning of the endotracheal and enteric tubes. Stable cardiomediastinal silhouette. No focal consolidation, pleural effusion, or pneumothorax. No acute osseous abnormality. IMPRESSION: 1. Unchanged positioning of the right subclavian central venous catheter with the tip in the proximal SVC. New mild redundancy of the catheter within the right subclavian vein. Electronically Signed   By: Obie Dredge M.D.   On: 03/06/2018 11:25   Dg Chest Portable 1 View  Result Date: 03/06/2018 CLINICAL DATA:  Central line placement. EXAM: PORTABLE CHEST 1 VIEW COMPARISON:  Earlier this day at 0317 hour FINDINGS: Tip of the right subclavian central venous catheter in the proximal SVC. No pneumothorax. Endotracheal and enteric tubes remain in place. Unchanged heart size and mediastinal contours. Mild bibasilar atelectasis. No pleural fluid. IMPRESSION: 1. Tip of the right subclavian central venous catheter in the proximal SVC. No pneumothorax. 2. Endotracheal and enteric tubes remain in place. 3. Persistent low lung volumes with bibasilar atelectasis. Electronically Signed   By: Narda Rutherford M.D.   On: 03/06/2018 05:44   Dg Chest Port 1 View  Result Date: 03/06/2018 CLINICAL DATA:  Heroin overdose. Endotracheal and NG tube placement. EXAM: PORTABLE CHEST 1 VIEW COMPARISON:  Chest radiograph 03/22/2017 FINDINGS: Endotracheal tube tip  17 mm from the carina. Enteric tube in place with tip and side-port below the diaphragm in the stomach. Lung volumes are low with mild bibasilar atelectasis. Normal heart size and mediastinal contours. No pleural fluid, pneumothorax or confluent airspace disease. No acute osseous abnormalities. IMPRESSION: 1. Endotracheal tube tip 17 mm from the carina. Enteric tube in place with tip and side-port below the diaphragm. 2. Low lung volumes with bibasilar atelectasis. Electronically Signed   By: Narda Rutherford M.D.   On: 03/06/2018 04:00    EKG:   Orders placed or performed during the hospital encounter of 03/06/18  . EKG 12-Lead  . EKG 12-Lead  . EKG 12-Lead  . EKG 12-Lead    ASSESSMENT AND PLAN:    #Acute encephalopathyIntensivist is following  Extubated  #Drug overdose -patient reports is unintentional Need psych evaluation Sitter for safety  #Fever probably from aspiration pneumonia Unasyn MRSA PCR negative  #History of anxiety/depression psych evaluation  #Hypokalemia/hypomagnesemia repleted  #Chronic pain syndrome with history of fibromyalgia Pain meds as needed  #GERD PPI  Transfer orders to the floor  All the records are reviewed and case discussed with Care Management/Social Workerr. Management plans discussed with the patient, family and they are in agreement.  CODE STATUS: fc   TOTAL TIME TAKING CARE OF THIS PATIENT: 35  minutes.   POSSIBLE D/C IN 1-2  DAYS, DEPENDING ON  CLINICAL CONDITION.  Note: This dictation was prepared with Dragon dictation along with smaller phrase technology. Any transcriptional errors that result from this process are unintentional.   Ramonita Lab M.D on 03/07/2018 at 3:37 PM  Between 7am to 6pm - Pager - [phone] After 6pm go to [email or web address] - password [email or web address] [facility]  [facility]  Office  [phone]  CC: Primary care physician; Pearson Grippe, MD

## 2018-03-07 NOTE — Consult Note (Signed)
Pharmacy Electrolyte Monitoring Consult:  Pharmacy consulted to assist in monitoring and replacing electrolytes in this 47 y.o. female admitted on 03/06/2018 with overdose on Seroquel. QTc was 586 upon admission; has decreased to 465.  Labs:  Sodium (mmol/L)  Date Value  03/07/2018 140  07/02/2013 139   Potassium (mmol/L)  Date Value  03/07/2018 3.4 (L)  07/02/2013 2.8 (L)   Magnesium (mg/dL)  Date Value  29/56/2130 2.0   Phosphorus (mg/dL)  Date Value  86/57/8469 2.2 (L)   Calcium (mg/dL)  Date Value  62/95/2841 8.3 (L)   Calcium, Total (mg/dL)  Date Value  32/44/0102 8.7   Albumin (g/dL)  Date Value  72/53/6644 3.6  07/02/2013 3.6    Assessment/Plan: Patient is extubated and has diet ordered. Will order potassium PO x 1 and potassium/sodium phosphate packet 1 packet with dinner and at bedtime.   Will replace for goal potassium ~ 4, goal magnesium ~ 2, and goal phosphorus > 2.5.   Will obtain follow up electrolytes with am labs.   Pharmacy will continue to monitor and adjust per consult.   Danayah Smyre L 03/07/2018 1:43 PM

## 2018-03-07 NOTE — Consult Note (Signed)
[facility] Consult   Reason for Consult: Consult for this 47 year old woman brought to the hospital with an apparent overdose Referring Physician: Gouru Patient Identification: Monique Castro MRN:  [id number] Principal Diagnosis: Benzodiazepine abuse [facility]) Diagnosis:   Patient Active Problem List   Diagnosis Date Noted  . Benzodiazepine abuse ([place]) [F13.10] 03/07/2018  . Polypharmacy [Z79.899] 03/07/2018  . Overdose [T50.901A] 03/06/2018  . S/P lumbar spinal fusion [Z98.1] 03/27/2017  . Epicondylitis [IMO0002] 12/29/2015  . 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 DDD (degenerative disc disease), lumbar [M51.36] 12/21/2014  . Facet syndrome, lumbar [M47.816] 12/21/2014  . Lumbar radiculopathy [M54.16] 12/21/2014  . Sacroiliac joint dysfunction [M53.3] 12/21/2014  . Fast heart beat [R00.0] 05/04/2014  . Arthropathy of lumbar facet joint [M47.816] 01/28/2014  . Adaptive colitis [K58.9] 08/11/2013  . Neuropathy [G62.9] 07/14/2013  . Interdigital neuralgia [M79.2] 06/17/2013  . Clinical depression [F32.9] 06/03/2013  . Fibrositis [M79.7] 04/18/2012    Total Time spent with patient: 1 hour  Subjective:   Monique Castro is a 47 y.o. female patient admitted with "I do not remember".  HPI: Patient seen chart reviewed.  Patient brought to the hospital last night apparently after a friend found her passed out and unarousable.  Patient was brought to the emergency room where she had to be intubated for airway protection.  Patient has been extubated by now.  She was awake alert and cooperative.  She tells me that she does not remember what happened that brought her into the hospital.  She remembers being at the motel room where she is currently staying in taking Seroquel.  Her description of this is not consistent from one telling to another.  At one point she told me that she took about 4 pills on another occasion she told me that she only took 25 mg.  She  seems uncertain whether she took any other medicines along with it.  What she does seem certain about is that she had no suicidal ideation.  Patient says she suffers from chronic sleep problems.  It sounds like a complex sleep issue probably made worse by her polypharmacy.  She says that she has narcolepsy for which she takes stimulants during the day and then takes sedating medicines in the evening as well as narcotic pain medicines.  She describes chaotic sleep patterns and says that she has to take medicine to sleep at night.  She says she has been under a lot of stress because of her relationship with her husband but denies any suicidal thoughts.  Denies feeling consistently depressed.  Denies any hallucinations.  Patient reports that she does currently see a psychiatric provider in [place].  Medical history: Patient has a history of chronic joint pain fibromyalgia  Social history: Patient says she is married but appears to have recently left her husband and is living in a motel.  She is a little evasive about all the details.  Substance abuse history: Denies alcohol or drug abuse.  Patient's drug screen is positive for benzodiazepines and opiates.  She is not prescribed any benzodiazepines suggesting that the Xanax that was involved was something she was clearly abusing.  Patient is prescribed oxycodone which typically does not show up on the opiate screen which raises my suspicion she may be taking extra narcotics as well.  Past Psychiatric History: No previous hospitalization.  No previous suicide attempts.  Chronic treatment for anxiety and depression symptoms and complicated sleep issues.  No history of psychotic disorder  Risk to Self:   Risk to Others:   Prior Inpatient Therapy:   Prior Outpatient Therapy:    Past Medical History:  Past Medical History:  Diagnosis Date  . Allergy   . Anxiety   . Arthritis   . Benzodiazepine abuse ([place]) 03/07/2018  . Depression   . Dysrhythmia     diagnosed with tachycardia in but not severe enough to require medications-per patient  . Fibromyalgia   . GERD (gastroesophageal reflux disease)   . Neuromuscular disorder ([place])   . Strep throat     Past Surgical History:  Procedure Laterality Date  . ABDOMINAL HYSTERECTOMY    . APPENDECTOMY    . BACK SURGERY     ACDF 2011  . TUBAL LIGATION     Family History:  Family History  Problem Relation Age of Onset  . Alcohol abuse Father    Family Psychiatric  History: None known Social History:  Social History   Substance and Sexual Activity  Alcohol Use Yes  . Alcohol/week: 0.0 standard drinks   Comment: rarely     Social History   Substance and Sexual Activity  Drug Use No    Social History   Socioeconomic History  . Marital status: Married    Spouse name: Not on file  . Number of children: Not on file  . Years of education: Not on file  . Highest education level: Not on file  Occupational History  . Not on file  Social Needs  . Financial resource strain: Not on file  . Food insecurity:    Worry: Not on file    Inability: Not on file  . Transportation needs:    Medical: Not on file    Non-medical: Not on file  Tobacco Use  . Smoking status: Current Every Day Smoker    Packs/day: 0.50    Types: Cigarettes  . Smokeless tobacco: Never Used  Substance and Sexual Activity  . Alcohol use: Yes    Alcohol/week: 0.0 standard drinks    Comment: rarely  . Drug use: No  . Sexual activity: Yes    Partners: Male    Birth control/protection: None  Lifestyle  . Physical activity:    Days per week: Not on file    Minutes per session: Not on file  . Stress: Not on file  Relationships  . Social connections:    Talks on phone: Not on file    Gets together: Not on file    Attends religious service: Not on file    Active member of club or organization: Not on file    Attends meetings of clubs or organizations: Not on file    Relationship status: Not on file  Other  Topics Concern  . Not on file  Social History Narrative  . Not on file   Additional Social History:    Allergies:   Allergies  Allergen Reactions  . Sulfa Antibiotics Rash  . Amoxicillin-Pot Clavulanate Diarrhea    Has patient had a PCN reaction causing immediate rash, facial/tongue/throat swelling, SOB or lightheadedness with hypotension: No Has patient had a PCN reaction causing severe rash involving mucus membranes or skin necrosis: No Has patient had a PCN reaction that required hospitalization: No Has patient had a PCN reaction occurring within the last 10 years: #  #  #  YES  #  #  #  If all of the above answers are "NO", then may proceed with Cephalosporin use.   Marland Kitchen Doxycycline Nausea And  Vomiting    GI Upset  . Macrobid [Nitrofurantoin Macrocrystal] Nausea Only    Labs:  Results for orders placed or performed during the hospital encounter of 03/06/18 (from the past 48 hour(s))  Acetaminophen level     Status: Abnormal   Collection Time: 03/06/18  3:19 AM  Result Value Ref Range   Acetaminophen (Tylenol), Serum <10 (L) 10 - 30 ug/mL    Comment: (NOTE) Therapeutic concentrations vary significantly. A range of 10-30 ug/mL  may be an effective concentration for many patients. However, some  are best treated at concentrations outside of this range. Acetaminophen concentrations >150 ug/mL at 4 hours after ingestion  and >50 ug/mL at 12 hours after ingestion are often associated with  toxic reactions. Performed at [facility], [address] [place], [place] [address]   Comprehensive metabolic panel     Status: Abnormal   Collection Time: 03/06/18  3:19 AM  Result Value Ref Range   Sodium 139 135 - 145 mmol/L   Potassium 2.5 (LL) 3.5 - 5.1 mmol/L    Comment: CRITICAL RESULT CALLED TO, READ BACK BY AND VERIFIED WITH VANESSA ASHLEY _0  03/06/18 [facility]    Chloride 104 98 - 111 mmol/L   CO2 25 22 - 32 mmol/L   Glucose, Bld 136 (H) 70 - 99 mg/dL   BUN 8 6 - 20  mg/dL   Creatinine, Ser 0.70 0.44 - 1.00 mg/dL   Calcium 8.5 (L) 8.9 - 10.3 mg/dL   Total Protein 6.6 6.5 - 8.1 g/dL   Albumin 3.6 3.5 - 5.0 g/dL   AST 21 15 - 41 U/L   ALT 16 0 - 44 U/L   Alkaline Phosphatase 88 38 - 126 U/L   Total Bilirubin 0.5 0.3 - 1.2 mg/dL   GFR calc non Af Amer >60 >60 mL/min   GFR calc Af Amer >60 >60 mL/min    Comment: (NOTE) The eGFR has been calculated using the CKD EPI equation. This calculation has not been validated in all clinical situations. eGFR's persistently <60 mL/min signify possible Chronic Kidney Disease.    Anion gap 10 5 - 15    Comment: Performed at [facility], [address] [place], [place] [address]  Ethanol     Status: None   Collection Time: 03/06/18  3:19 AM  Result Value Ref Range   Alcohol, Ethyl (B) <10 <10 mg/dL    Comment: (NOTE) Lowest detectable limit for serum alcohol is 10 mg/dL. For medical purposes only. Performed at [facility], [address] [place], [place] [address]   Salicylate level     Status: None   Collection Time: 03/06/18  3:19 AM  Result Value Ref Range   Salicylate Lvl <2.9 2.8 - 30.0 mg/dL    Comment: Performed at [facility], [address] [place], [place] [address]  CBC with Differential     Status: Abnormal   Collection Time: 03/06/18  3:19 AM  Result Value Ref Range   WBC 4.7 4.0 - 10.5 K/uL   RBC 1.97 (L) 3.87 - 5.11 MIL/uL   Hemoglobin 6.1 (L) 12.0 - 15.0 g/dL   HCT 20.0 (L) 36.0 - 46.0 %   MCV 101.5 (H) 80.0 - 100.0 fL   MCH 31.0 26.0 - 34.0 pg   MCHC 30.5 30.0 - 36.0 g/dL   RDW 11.9 11.5 - 15.5 %   Platelets 176 150 - 400 K/uL   nRBC 0.0 0.0 - 0.2 %   Neutrophils  Relative % 42 %   Neutro Abs 1.9 1.7 - 7.7 K/uL   Lymphocytes Relative 46 %   Lymphs Abs 2.2 0.7 - 4.0 K/uL   Monocytes Relative 9 %   Monocytes Absolute 0.4 0.1 - 1.0 K/uL   Eosinophils Relative 2 %   Eosinophils Absolute 0.1 0.0 - 0.5 K/uL   Basophils Relative 1 %   Basophils  Absolute 0.0 0.0 - 0.1 K/uL   Immature Granulocytes 0 %   Abs Immature Granulocytes 0.02 0.00 - 0.07 K/uL    Comment: Performed at [facility], [address] [place], [place] [address]  Urine Drug Screen, Qualitative     Status: Abnormal   Collection Time: 03/06/18  3:19 AM  Result Value Ref Range   Tricyclic, Ur Screen POSITIVE (A) NONE DETECTED   Amphetamines, Ur Screen NONE DETECTED NONE DETECTED   MDMA (Ecstasy)Ur Screen NONE DETECTED NONE DETECTED   Cocaine Metabolite,Ur [place] NONE DETECTED NONE DETECTED   Opiate, Ur Screen POSITIVE (A) NONE DETECTED   Phencyclidine (PCP) Ur S NONE DETECTED NONE DETECTED   Cannabinoid 50 Ng, Ur [place] NONE DETECTED NONE DETECTED   Barbiturates, Ur Screen NONE DETECTED NONE DETECTED   Benzodiazepine, Ur Scrn POSITIVE (A) NONE DETECTED   Methadone Scn, Ur NONE DETECTED NONE DETECTED    Comment: (NOTE) Tricyclics + metabolites, urine    Cutoff 1000 ng/mL Amphetamines + metabolites, urine  Cutoff 1000 ng/mL MDMA (Ecstasy), urine              Cutoff 500 ng/mL Cocaine Metabolite, urine          Cutoff 300 ng/mL Opiate + metabolites, urine        Cutoff 300 ng/mL Phencyclidine (PCP), urine         Cutoff 25 ng/mL Cannabinoid, urine                 Cutoff 50 ng/mL Barbiturates + metabolites, urine  Cutoff 200 ng/mL Benzodiazepine, urine              Cutoff 200 ng/mL Methadone, urine                   Cutoff 300 ng/mL The urine drug screen provides only a preliminary, unconfirmed analytical test result and should not be used for non-medical purposes. Clinical consideration and professional judgment should be applied to any positive drug screen result due to possible interfering substances. A more specific alternate chemical method must be used in order to obtain a confirmed analytical result. Gas chromatography / mass spectrometry (GC/MS) is the preferred confirmat ory method. Performed at [facility], [address]  [place], [place] [address]   hCG, quantitative, pregnancy     Status: None   Collection Time: 03/06/18  3:19 AM  Result Value Ref Range   hCG, Beta Chain, Quant, S 2 <5 mIU/mL    Comment:          GEST. AGE      CONC.  (mIU/mL)   <=1 WEEK        5 - 50     2 WEEKS       50 - 500     3 WEEKS       100 - 10,000     4 WEEKS     1,000 - 30,000     5 WEEKS     3,500 - 115,000   6-8 WEEKS     12,000 - 270,000  12 WEEKS     15,000 - 220,000        FEMALE AND NON-PREGNANT FEMALE:     LESS THAN 5 mIU/mL Performed at [facility], [place], [place] [address]   Lactic acid, plasma     Status: Abnormal   Collection Time: 03/06/18  3:19 AM  Result Value Ref Range   Lactic Acid, Venous 3.2 (HH) 0.5 - 1.9 mmol/L    Comment: CRITICAL RESULT CALLED TO, READ BACK BY AND VERIFIED WITH VANESSA ASHLEY _0  03/06/18 [facility] Performed at [facility], [address] [place], [place] [address]   CK     Status: None   Collection Time: 03/06/18  3:19 AM  Result Value Ref Range   Total CK 41 38 - 234 U/L    Comment: Performed at [facility], [address] [place], [place] [address]  Protime-INR     Status: None   Collection Time: 03/06/18  3:19 AM  Result Value Ref Range   Prothrombin Time 12.5 11.4 - 15.2 seconds   INR 0.94     Comment: Performed at [facility], [address], [place], [place] [address]  ABO/Rh     Status: None   Collection Time: 03/06/18  3:19 AM  Result Value Ref Range   ABO/RH(D)      A POS Performed at [facility], [address] [place], [place] [address]   Urinalysis, Routine w reflex microscopic     Status: None   Collection Time: 03/06/18  3:19 AM  Result Value Ref Range   Color, Urine YELLOW YELLOW   APPearance CLEAR CLEAR   Specific Gravity, Urine 1.025 1.005 - 1.030   pH 5.5 5.0 - 8.0   Glucose, UA NEGATIVE NEGATIVE mg/dL   Hgb urine dipstick NEGATIVE NEGATIVE   Bilirubin Urine NEGATIVE NEGATIVE    Ketones, ur NEGATIVE NEGATIVE mg/dL   Protein, ur NEGATIVE NEGATIVE mg/dL   Nitrite NEGATIVE NEGATIVE   Leukocytes, UA NEGATIVE NEGATIVE    Comment: Microscopic not done on urines with negative protein, blood, leukocytes, nitrite, or glucose < 500 mg/dL. Performed at [facility], [address] [place], [place] [address]   Magnesium     Status: Abnormal   Collection Time: 03/06/18  3:19 AM  Result Value Ref Range   Magnesium 1.6 (L) 1.7 - 2.4 mg/dL    Comment: Performed at [facility], [address] [place], [place] [address]  Phosphorus     Status: None   Collection Time: 03/06/18  3:19 AM  Result Value Ref Range   Phosphorus 4.5 2.5 - 4.6 mg/dL    Comment: Performed at [facility], [address] [place], [place] [address]  Vitamin B12     Status: None   Collection Time: 03/06/18  3:19 AM  Result Value Ref Range   Vitamin B-12 210 180 - 914 pg/mL    Comment: (NOTE) This assay is not validated for testing neonatal or myeloproliferative syndrome specimens for Vitamin B12 levels. Performed at [facility], [address] [place], [place] [address]   Folate     Status: None   Collection Time: 03/06/18  3:19 AM  Result Value Ref Range   Folate 7.3 >5.9 ng/mL    Comment: Performed at [facility], [address] [place], [place] [address]  Iron and TIBC     Status: None   Collection Time: 03/06/18  3:19 AM  Result Value  Ref Range   Iron 93 28 - 170 ug/dL   TIBC 305 250 - 450 ug/dL   Saturation Ratios 30 10.4 - 31.8 %   UIBC 213 ug/dL    Comment: Performed at [facility], [address] [place], [place] [address]  Ferritin     Status: None   Collection Time: 03/06/18  3:19 AM  Result Value Ref Range   Ferritin 47 11 - 307 ng/mL    Comment: Performed at [facility], [address] [place], [place] [address]  Transferrin     Status: None   Collection Time: 03/06/18  3:19 AM  Result Value Ref  Range   Transferrin 220 192 - 382 mg/dL    Comment: Performed at [facility], [place] [address] [place], [place] [address]  Pregnancy, urine POC     Status: None   Collection Time: 03/06/18  3:51 AM  Result Value Ref Range   Preg Test, Ur NEGATIVE NEGATIVE    Comment:        THE SENSITIVITY OF THIS METHODOLOGY IS >24 mIU/mL   Blood gas, arterial     Status: Abnormal   Collection Time: 03/06/18  4:09 AM  Result Value Ref Range   FIO2 0.40    Delivery systems VENTILATOR    Mode ASSIST CONTROL    VT 400 mL   Peep/cpap 5.0 cm H20   pH, Arterial 7.43 7.350 - 7.450   pCO2 arterial 38 32.0 - 48.0 mmHg   pO2, Arterial 132 (H) 83.0 - 108.0 mmHg   Bicarbonate 25.2 20.0 - 28.0 mmol/L   Acid-Base Excess 0.9 0.0 - 2.0 mmol/L   O2 Saturation 99.1 %   Patient temperature 37.0    Collection site LEFT RADIAL    Sample type ARTERIAL DRAW    Allens test (pass/fail) PASS PASS   Mechanical Rate 20     Comment: Performed at [facility], [address] [place], [place] [address]  Lactic acid, plasma     Status: None   Collection Time: 03/06/18  5:29 AM  Result Value Ref Range   Lactic Acid, Venous 1.2 0.5 - 1.9 mmol/L    Comment: Performed at [facility], [address] [place], [place] [address]  Type and screen [place]     Status: None   Collection Time: 03/06/18  5:29 AM  Result Value Ref Range   ABO/RH(D) A POS    Antibody Screen NEG    Sample Expiration 03/09/2018    Unit Number [id number]    Blood Component Type RED CELLS,LR    Unit division 00    Status of Unit ISSUED,FINAL    Transfusion Status OK TO TRANSFUSE    Crossmatch Result Compatible    Unit Number [id number]    Blood Component Type RED CELLS,LR    Unit division 00    Status of Unit ISSUED,FINAL    Transfusion Status OK TO TRANSFUSE    Crossmatch Result      Compatible Performed at [facility], [address] [place], [place] [address]   Prepare  RBC     Status: None   Collection Time: 03/06/18  5:29 AM  Result Value Ref Range   Order Confirmation      ORDER PROCESSED BY BLOOD BANK Performed at [facility], [address] [place], [place] [address]   Glucose, capillary     Status: Abnormal   Collection Time: 03/06/18  6:34 AM  Result Value Ref Range   Glucose-Capillary 160 (H) 70 - 99 mg/dL  MRSA PCR Screening     Status: None   Collection Time: 03/06/18  6:51 AM  Result Value Ref Range   MRSA by PCR NEGATIVE NEGATIVE    Comment:        The GeneXpert MRSA Assay (FDA approved for NASAL specimens only), is one component of a comprehensive MRSA colonization surveillance program. It is not intended to diagnose MRSA infection nor to guide or monitor treatment for MRSA infections. Performed at [facility] [address] [place], [place] [address]   Basic metabolic panel     Status: Abnormal   Collection Time: 03/06/18 11:47 AM  Result Value Ref Range   Sodium 141 135 - 145 mmol/L   Potassium 4.0 3.5 - 5.1 mmol/L   Chloride 106 98 - 111 mmol/L   CO2 28 22 - 32 mmol/L   Glucose, Bld 92 70 - 99 mg/dL   BUN 6 6 - 20 mg/dL   Creatinine, Ser 0.74 0.44 - 1.00 mg/dL   Calcium 8.1 (L) 8.9 - 10.3 mg/dL   GFR calc non Af Amer >60 >60 mL/min   GFR calc Af Amer >60 >60 mL/min    Comment: (NOTE) The eGFR has been calculated using the CKD EPI equation. This calculation has not been validated in all clinical situations. eGFR's persistently <60 mL/min signify possible Chronic Kidney Disease.    Anion gap 7 5 - 15    Comment: Performed at [facility], [address] [place], [place] [address]  Magnesium     Status: None   Collection Time: 03/06/18 11:47 AM  Result Value Ref Range   Magnesium 2.0 1.7 - 2.4 mg/dL    Comment: Performed at [facility], [address] [place], [place] [address]  Phosphorus     Status: None   Collection Time: 03/06/18 11:47 AM  Result Value Ref Range    Phosphorus 3.3 2.5 - 4.6 mg/dL    Comment: Performed at [facility], [address] [place], [place] [address]  Calcium, ionized     Status: None   Collection Time: 03/06/18 11:47 AM  Result Value Ref Range   Calcium, Ionized, Serum 4.7 4.5 - 5.6 mg/dL    Comment: (NOTE) Performed At: [facility] [place], [place] [id number] Rush Farmer MD [id number]   Hemoglobin and hematocrit, blood     Status: Abnormal   Collection Time: 03/06/18 11:47 AM  Result Value Ref Range   Hemoglobin 11.8 (L) 12.0 - 15.0 g/dL    Comment: POST TRANSFUSION SPECIMEN   HCT 36.2 36.0 - 46.0 %    Comment: Performed at [facility], [address] [place], [place] [address]  Reticulocytes     Status: Abnormal   Collection Time: 03/06/18 11:48 AM  Result Value Ref Range   Retic Ct Pct 1.5 0.4 - 3.1 %   RBC. 3.82 (L) 3.87 - 5.11 MIL/uL   Retic Count, Absolute 56.9 19.0 - 186.0 K/uL   Immature Retic Fract 7.3 2.3 - 15.9 %    Comment: Performed at [facility], [address], [place], [place] [address]  Basic metabolic panel     Status: Abnormal   Collection Time: 03/06/18  8:31 PM  Result Value Ref Range   Sodium 138 135 - 145 mmol/L   Potassium 3.6 3.5 - 5.1 mmol/L   Chloride 108 98 - 111 mmol/L   CO2  27 22 - 32 mmol/L   Glucose, Bld 95 70 - 99 mg/dL   BUN 7 6 - 20 mg/dL   Creatinine, Ser 0.73 0.44 - 1.00 mg/dL   Calcium 8.3 (L) 8.9 - 10.3 mg/dL   GFR calc non Af Amer >60 >60 mL/min   GFR calc Af Amer >60 >60 mL/min    Comment: (NOTE) The eGFR has been calculated using the CKD EPI equation. This calculation has not been validated in all clinical situations. eGFR's persistently <60 mL/min signify possible Chronic Kidney Disease.    Anion gap 3 (L) 5 - 15    Comment: Performed at [facility], [address] [place], [place] [address]  Magnesium     Status: None   Collection Time: 03/06/18  8:31 PM  Result Value Ref Range    Magnesium 2.1 1.7 - 2.4 mg/dL    Comment: Performed at [facility], [address] [place], [place] [address]  Phosphorus     Status: None   Collection Time: 03/06/18  8:31 PM  Result Value Ref Range   Phosphorus 2.5 2.5 - 4.6 mg/dL    Comment: Performed at [facility], [address] [place], [place] [address]  Hemoglobin and hematocrit, blood     Status: None   Collection Time: 03/06/18  8:31 PM  Result Value Ref Range   Hemoglobin 13.4 12.0 - 15.0 g/dL   HCT 41.4 36.0 - 46.0 %    Comment: Performed at [facility], [address] [place], [place] [address]  Procalcitonin - Baseline     Status: None   Collection Time: 03/06/18  8:31 PM  Result Value Ref Range   Procalcitonin <0.10 ng/mL    Comment:        Interpretation: PCT (Procalcitonin) <= 0.5 ng/mL: Systemic infection (sepsis) is not likely. Local bacterial infection is possible. (NOTE)       Sepsis PCT Algorithm           Lower Respiratory Tract                                      Infection PCT Algorithm    ----------------------------     ----------------------------         PCT < 0.25 ng/mL                PCT < 0.10 ng/mL         Strongly encourage             Strongly discourage   discontinuation of antibiotics    initiation of antibiotics    ----------------------------     -----------------------------       PCT 0.25 - 0.50 ng/mL            PCT 0.10 - 0.25 ng/mL               OR       >80% decrease in PCT            Discourage initiation of                                            antibiotics      Encourage discontinuation           of antibiotics    ----------------------------     -----------------------------  PCT >= 0.50 ng/mL              PCT 0.26 - 0.50 ng/mL               AND        <80% decrease in PCT             Encourage initiation of                                             antibiotics       Encourage continuation           of antibiotics     ----------------------------     -----------------------------        PCT >= 0.50 ng/mL                  PCT > 0.50 ng/mL               AND         increase in PCT                  Strongly encourage                                      initiation of antibiotics    Strongly encourage escalation           of antibiotics                                     -----------------------------                                           PCT <= 0.25 ng/mL                                                 OR                                        > 80% decrease in PCT                                     Discontinue / Do not initiate                                             antibiotics Performed at [facility], [address] [place], [place] [address]   Basic metabolic panel     Status: Abnormal   Collection Time: 03/07/18  4:04 AM  Result Value Ref Range   Sodium 140 135 - 145 mmol/L   Potassium 3.4 (L) 3.5 - 5.1 mmol/L   Chloride 108 98 - 111 mmol/L   CO2 28  22 - 32 mmol/L   Glucose, Bld 99 70 - 99 mg/dL   BUN 7 6 - 20 mg/dL   Creatinine, Ser 0.77 0.44 - 1.00 mg/dL   Calcium 8.3 (L) 8.9 - 10.3 mg/dL   GFR calc non Af Amer >60 >60 mL/min   GFR calc Af Amer >60 >60 mL/min    Comment: (NOTE) The eGFR has been calculated using the CKD EPI equation. This calculation has not been validated in all clinical situations. eGFR's persistently <60 mL/min signify possible Chronic Kidney Disease.    Anion gap 4 (L) 5 - 15    Comment: Performed at [facility], [address] [place], [place] [address]  Magnesium     Status: None   Collection Time: 03/07/18  4:04 AM  Result Value Ref Range   Magnesium 2.0 1.7 - 2.4 mg/dL    Comment: Performed at [facility], [address] [place], [place] [address]  Phosphorus     Status: Abnormal   Collection Time: 03/07/18  4:04 AM  Result Value Ref Range   Phosphorus 2.2 (L) 2.5 - 4.6 mg/dL    Comment: Performed at [facility], [address] [place], [place] [address]  CBC with Differential/Platelet     Status: Abnormal   Collection Time: 03/07/18  4:04 AM  Result Value Ref Range   WBC 11.1 (H) 4.0 - 10.5 K/uL   RBC 4.12 3.87 - 5.11 MIL/uL   Hemoglobin 12.7 12.0 - 15.0 g/dL   HCT 39.1 36.0 - 46.0 %   MCV 94.9 80.0 - 100.0 fL   MCH 30.8 26.0 - 34.0 pg   MCHC 32.5 30.0 - 36.0 g/dL   RDW 14.3 11.5 - 15.5 %   Platelets 272 150 - 400 K/uL   nRBC 0.0 0.0 - 0.2 %   Neutrophils Relative % 68 %   Neutro Abs 7.6 1.7 - 7.7 K/uL   Lymphocytes Relative 21 %   Lymphs Abs 2.3 0.7 - 4.0 K/uL   Monocytes Relative 9 %   Monocytes Absolute 1.0 0.1 - 1.0 K/uL   Eosinophils Relative 1 %   Eosinophils Absolute 0.1 0.0 - 0.5 K/uL   Basophils Relative 0 %   Basophils Absolute 0.1 0.0 - 0.1 K/uL   Immature Granulocytes 1 %   Abs Immature Granulocytes 0.06 0.00 - 0.07 K/uL    Comment: Performed at [facility], [address] [place], [place] [address]    Current Facility-Administered Medications  Medication Dose Route Frequency Provider Last Rate Last Dose  . acetaminophen (TYLENOL) tablet 650 mg  650 mg Oral Q4H PRN Arta Silence, MD      . Ampicillin-Sulbactam (UNASYN) 3 g in sodium chloride 0.9 % 100 mL IVPB  3 g Intravenous Q6H Samaan, Maged, MD 200 mL/hr at 03/07/18 1316 3 g at 03/07/18 1316  . bisacodyl (DULCOLAX) EC tablet 5 mg  5 mg Oral Daily PRN Arta Silence, MD      . feeding supplement (ENSURE ENLIVE) (ENSURE ENLIVE) liquid 237 mL  237 mL Oral BID BM Samaan, Maged, MD      . multivitamin with minerals tablet 1 tablet  1 tablet Oral Daily Cassandria Santee, MD   1 tablet at 03/07/18 1456  . [START ON 03/08/2018] pantoprazole (PROTONIX) EC tablet 20 mg  20 mg Oral BID Samaan, Maged, MD      . phenol (CHLORASEPTIC) mouth spray 1 spray  1 spray Mouth/Throat PRN Cassandria Santee, MD      .  potassium & sodium phosphates (PHOS-NAK) 280-160-250 MG packet 1 packet  1 packet Oral TID WC & HS  Charlett Nose, RPH      . senna-docusate (Senokot-S) tablet 1 tablet  1 tablet Oral QHS PRN Arta Silence, MD   1 tablet at 03/07/18 1050  . sodium chloride flush (NS) 0.9 % injection 10-40 mL  10-40 mL Intracatheter Q12H Samaan, Maged, MD   10 mL at 03/07/18 1051  . sodium chloride flush (NS) 0.9 % injection 10-40 mL  10-40 mL Intracatheter PRN Cassandria Santee, MD        Musculoskeletal: Strength & Muscle Tone: within normal limits Gait & Station: normal Patient leans: N/A  Psychiatric Specialty Exam: Physical Exam  Nursing note and vitals reviewed. Constitutional: She appears well-developed and well-nourished.  HENT:  Head: Normocephalic and atraumatic.  Eyes: Pupils are equal, round, and reactive to light. Conjunctivae are normal.  Neck: Normal range of motion.  Cardiovascular: Regular rhythm and normal heart sounds.  Respiratory: Effort normal. No respiratory distress.  GI: Soft.  Musculoskeletal: Normal range of motion.  Neurological: She is alert.  Skin: Skin is warm and dry.  Psychiatric: She has a normal mood and affect. Her speech is normal and behavior is normal. Thought content normal. She expresses inappropriate judgment. She exhibits abnormal recent memory.    Review of Systems  Constitutional: Negative.   HENT: Negative.   Eyes: Negative.   Respiratory: Negative.   Cardiovascular: Negative.   Gastrointestinal: Negative.   Musculoskeletal: Negative.   Skin: Negative.   Neurological: Negative.   Psychiatric/Behavioral: Positive for memory loss. Negative for depression, hallucinations, substance abuse and suicidal ideas. The patient has insomnia. The patient is not nervous/anxious.     Blood pressure 127/61, pulse (!) 113, temperature 99.5 F (37.5 C), temperature source Oral, resp. rate 18, height _0  (1.626 m), weight 57.4 kg, SpO2 96 %.Body mass index is 21.72 kg/m.  General Appearance: Fairly Groomed  Eye Contact:  Good  Speech:  Clear and  Coherent  Volume:  Normal  Mood:  Euthymic  Affect:  Congruent  Thought Process:  Goal Directed  Orientation:  Full (Time, Place, and Person)  Thought Content:  Logical  Suicidal Thoughts:  No  Homicidal Thoughts:  No  Memory:  Immediate;   Fair Recent;   Poor Remote;   Fair  Judgement:  Impaired  Insight:  Shallow  Psychomotor Activity:  Normal  Concentration:  Concentration: Fair  Recall:  [employer] of Knowledge:  Fair  Language:  Fair  Akathisia:  No  Handed:  Right  AIMS (if indicated):     Assets:  Desire for Improvement Housing Physical Health  ADL's:  Intact  Cognition:  WNL  Sleep:        Treatment Plan Summary: Plan 47 year old woman came in with what appears to have been an overdose with a combination polypharmacy probably including narcotics and benzodiazepines.  The Xanax is not something she appears to be prescribed suggesting that she is taking extra controlled substances.  Patient has no insight into the abusive nature of her drug use.  Totally denies suicidal thoughts denies symptoms of depression.  Patient was not placed under IVC and does not meet IV C criteria.  Does not appear to be depressed.  Spent some time doing supportive and psychoeducation concerning substance abuse and encouraging patient to minimize polypharmacy patient however does not need to be admitted to the psychiatric ward.  Case reviewed.  If she remains in the  hospital I can follow-up tomorrow but she already has outpatient treatment  Disposition: No evidence of imminent risk to self or others at present.   Patient does not meet criteria for psychiatric inpatient admission. Supportive therapy provided about ongoing stressors.  Alethia Berthold, MD 03/07/2018 4:07 PM

## 2018-03-07 NOTE — Progress Notes (Signed)
Nutrition Follow Up Note   DOCUMENTATION CODES:   Not applicable  INTERVENTION:   Ensure Enlive po BID, each supplement provides 350 kcal and 20 grams of protein  MVI daily   NUTRITION DIAGNOSIS:   Inadequate oral intake related to inability to eat as evidenced by NPO status. -pt advanced to regular diet today  GOAL:   Patient will meet greater than or equal to 90% of their needs  MONITOR:   PO intake, Supplement acceptance, Labs, Weight trends, Skin, I & O's  ASSESSMENT:   47 year old female with PMHx of anxiety, depression, arthritis, GERD, fibromyalgia, hx ACDF in 2011, chronic pain who is admitted after overdose and intubated on 10/24 for airway protection.   Pt extubated today; tolerating well. Pt initiated on a regular diet. RD will add supplements and MVI. Per chart, pt weight stable since admit. Pt with low K and P today; pharmacy following for electrolyte replacement.   Medications reviewed and include: protonix, unasyn, senokot  Labs reviewed: K 3.4(L), P 2.2(L), Mg 2.0 wnl  Diet Order:   Diet Order            Diet regular Room service appropriate? Yes; Fluid consistency: Thin  Diet effective now             EDUCATION NEEDS:   No education needs have been identified at this time  Skin:  Skin Assessment: Reviewed RN Assessment  Last BM:  10/22  Height:   Ht Readings from Last 1 Encounters:  03/06/18 5\' 4"  (1.626 m)    Weight:   Wt Readings from Last 1 Encounters:  03/07/18 57.4 kg    Ideal Body Weight:  54.5 kg  BMI:  Body mass index is 21.72 kg/m.  Estimated Nutritional Needs:   Kcal:  1600-1800kcal/day   Protein:  70-80g/day   Fluid:  >1.7L/day   Betsey Holiday MS, RD, LDN Pager #- [phone] Office#- [phone] After Hours Pager: [phone]

## 2018-03-07 NOTE — Progress Notes (Signed)
Name: Monique Castro MRN: [id number] DOB: 24-Mar-1971     CONSULTATION DATE: 03/06/2018 Subjective & objectives: Awake when off sedation.  Febrile with T-max 101.5  PAST MEDICAL HISTORY :   has a past medical history of Allergy, Anxiety, Arthritis, Depression, Dysrhythmia, Fibromyalgia, GERD (gastroesophageal reflux disease), Neuromuscular disorder (HCC), and Strep throat.  has a past surgical history that includes Appendectomy; Abdominal hysterectomy; Tubal ligation; and Back surgery. Prior to Admission medications   Medication Sig Start Date End Date Taking? Authorizing Provider  buPROPion (WELLBUTRIN XL) 300 MG 24 hr tablet Take 300 mg by mouth daily.  01/12/15  Yes [provider]  desvenlafaxine (PRISTIQ) 50 MG 24 hr tablet Take 50 mg every evening by mouth.    Yes [provider]  hydrOXYzine (VISTARIL) 25 MG capsule Take 1 capsule by mouth 2 (two) times daily as needed for anxiety. 02/12/18  Yes [provider]  lisdexamfetamine (VYVANSE) 50 MG capsule Take 100 mg daily by mouth.   Yes [provider]  oxyCODONE 20 MG TABS Take 1 tablet (20 mg total) every 4 (four) hours as needed by mouth for severe pain ((score 7 to 10)). 03/29/17  Yes Donalee Citrin, MD  Oxycodone HCl 20 MG TABS Take 20 mg by mouth every 4 (four) hours as needed (severe pain). (score 7 to 10)   Yes [provider]  pantoprazole (PROTONIX) 40 MG tablet Take 40 mg every evening by mouth. Reported on 08/09/2015 10/18/14  Yes [provider]  QUEtiapine (SEROQUEL) 50 MG tablet Take 1 tablet by mouth at bedtime. 02/12/18  Yes [provider]  tiZANidine (ZANAFLEX) 4 MG tablet Take 1 tablet (4 mg total) at bedtime by mouth. 03/29/17  Yes Donalee Citrin, MD  buPROPion (WELLBUTRIN XL) 300 MG 24 hr tablet Take 300 mg every evening by mouth.  01/12/15 03/20/17  [provider]  cetirizine-pseudoephedrine (ZYRTEC-D ALLERGY & CONGESTION) 5-120 MG tablet Take 1 tablet  daily as needed by mouth for allergies. Reported on 11/30/2015 10/07/07   [provider]  cholestyramine Lanetta Inch) 4 GM/DOSE powder Take by mouth. 07/08/13   [provider]  clotrimazole-betamethasone (LOTRISONE) cream APP EXT AA BID 06/24/17   [provider]  diclofenac sodium (VOLTAREN) 1 % GEL Apply 2 g daily as needed topically (pain).    [provider]  oxymorphone (OPANA) 10 MG tablet Limit 1 tab by mouth 3 - 5 times per day if tolerated Patient not taking: Reported on 03/20/2017 12/29/15   Ewing Schlein, MD  simvastatin (ZOCOR) 10 MG tablet Take 10 mg every evening by mouth.    [provider]  tamsulosin (FLOMAX) 0.4 MG CAPS capsule Take 0.4 mg at bedtime by mouth.    [provider]  triamcinolone (NASACORT) 55 MCG/ACT AERO nasal inhaler Place 1 spray daily as needed into the nose (allergies). Reported on 07/11/2015    [provider]   Allergies  Allergen Reactions  . Sulfa Antibiotics Rash  . Amoxicillin-Pot Clavulanate Diarrhea    Has patient had a PCN reaction causing immediate rash, facial/tongue/throat swelling, SOB or lightheadedness with hypotension: No Has patient had a PCN reaction causing severe rash involving mucus membranes or skin necrosis: No Has patient had a PCN reaction that required hospitalization: No Has patient had a PCN reaction occurring within the last 10 years: #  #  #  YES  #  #  #  If all of the above answers are "NO", then may proceed with Cephalosporin use.   Marland Kitchen  Doxycycline Nausea And Vomiting    GI Upset  . Macrobid [Nitrofurantoin Macrocrystal] Nausea Only    FAMILY HISTORY:  family history includes Alcohol abuse in her father. SOCIAL HISTORY:  reports that she has been smoking cigarettes. She has been smoking about 0.50 packs per day. She has never used smokeless tobacco. She reports that she drinks alcohol. She reports that she does not use drugs.  REVIEW OF SYSTEMS:   Unable to  obtain due to critical illness   VITAL SIGNS: Temp:  [98.6 F (37 C)-101.5 F (38.6 C)] 99.5 F (37.5 C) (10/25 1442) Pulse Rate:  [85-120] 113 (10/25 1442) Resp:  [17-23] 18 (10/25 1442) BP: (85-127)/(59-82) 127/61 (10/25 1442) SpO2:  [93 %-100 %] 96 % (10/25 1442) FiO2 (%):  [28 %] 28 % (10/25 0807) Weight:  [57.4 kg] 57.4 kg (10/25 0500)  Physical Examination:  Off sedation awake, following command and no focal neurological deficits on vent, no distress, bilateral equal air entry with no adventitious sounds S1 & S2 are audible with no murmur Benign abdomen with feeble peristalsis No leg edema   ASSESSMENT / PLAN:  Acute respiratory failure intubated for airway protection.  Tolerating SBT with good weaning parameters -Extubation  Altered mental status was drug intoxication/overmedication (resolved).  UDS positive for benzodiazepine + opiates + try cyclic.  Reported by nursing the patient was able to move all extremities with tapering off sedation.   Possible suicidal attempt with history of depression and previous drug overdose -Psych evaluation once extubated and medically clear.  Atelectasis was possible aspiration.  Improved bibasilar airspace disease -Empiric Unasyn.  Monitor CXR + CBC + FiO2 requirement -T max 101.5. Procalcitonin < 0.1 -Follow with cultures  QTC prolongation (resolved)  Lactic acidemia most possible sepsis versus tissue hypoperfusion -Optimize hydration, monitor lactic acid and procalcitonin.  Anemia versus not better with initial hemoglobin 6.1 repeat 11.8 -Monitor H&H and keep hemoglobin more than 7 g/dL  Fibromyalgia -Optimize analgesia  GERD -PPI  Hypokalemia and hypophosphatemia -Replete and monitor electrolytes  Full code  DVT & GI prophylaxis.  Continue with supportive care.  Critical care time 45 minutes

## 2018-03-08 ENCOUNTER — Inpatient Hospital Stay: Payer: BLUE CROSS/BLUE SHIELD

## 2018-03-08 LAB — CBC WITH DIFFERENTIAL/PLATELET
Abs Immature Granulocytes: 0.05 10*3/uL (ref 0.00–0.07)
BASOS ABS: 0.1 10*3/uL (ref 0.0–0.1)
BASOS PCT: 1 %
Eosinophils Absolute: 0.2 10*3/uL (ref 0.0–0.5)
Eosinophils Relative: 2 %
HCT: 42.4 % (ref 36.0–46.0)
HEMOGLOBIN: 13.6 g/dL (ref 12.0–15.0)
Immature Granulocytes: 1 %
Lymphocytes Relative: 31 %
Lymphs Abs: 3.4 10*3/uL (ref 0.7–4.0)
MCH: 30.5 pg (ref 26.0–34.0)
MCHC: 32.1 g/dL (ref 30.0–36.0)
MCV: 95.1 fL (ref 80.0–100.0)
MONO ABS: 1 10*3/uL (ref 0.1–1.0)
Monocytes Relative: 9 %
NEUTROS ABS: 6.4 10*3/uL (ref 1.7–7.7)
NRBC: 0 % (ref 0.0–0.2)
Neutrophils Relative %: 56 %
Platelets: 304 10*3/uL (ref 150–400)
RBC: 4.46 MIL/uL (ref 3.87–5.11)
RDW: 13.2 % (ref 11.5–15.5)
WBC: 11 10*3/uL — AB (ref 4.0–10.5)

## 2018-03-08 LAB — BASIC METABOLIC PANEL
Anion gap: 8 (ref 5–15)
BUN: 7 mg/dL (ref 6–20)
CHLORIDE: 106 mmol/L (ref 98–111)
CO2: 28 mmol/L (ref 22–32)
Calcium: 8.5 mg/dL — ABNORMAL LOW (ref 8.9–10.3)
Creatinine, Ser: 0.82 mg/dL (ref 0.44–1.00)
Glucose, Bld: 128 mg/dL — ABNORMAL HIGH (ref 70–99)
POTASSIUM: 3.2 mmol/L — AB (ref 3.5–5.1)
SODIUM: 142 mmol/L (ref 135–145)

## 2018-03-08 LAB — MAGNESIUM: MAGNESIUM: 1.8 mg/dL (ref 1.7–2.4)

## 2018-03-08 LAB — CALCIUM, IONIZED
CALCIUM, IONIZED, SERUM: 4.9 mg/dL (ref 4.5–5.6)
Calcium, Ionized, Serum: 4.9 mg/dL (ref 4.5–5.6)

## 2018-03-08 LAB — PHOSPHORUS: Phosphorus: 3.8 mg/dL (ref 2.5–4.6)

## 2018-03-08 MED ORDER — LACTINEX PO CHEW: 1.0000 | CHEWABLE_TABLET | Freq: Three times a day (TID) | ORAL | 0 refills | Status: AC

## 2018-03-08 MED ORDER — AMPICILLIN 500 MG PO CAPS: 500.0000 mg | ORAL_CAPSULE | Freq: Three times a day (TID) | ORAL | 0 refills | Status: DC

## 2018-03-08 MED ORDER — METRONIDAZOLE 500 MG PO TABS: 500.0000 mg | ORAL_TABLET | Freq: Three times a day (TID) | ORAL | 0 refills | Status: AC

## 2018-03-08 MED ORDER — POTASSIUM CHLORIDE 20 MEQ PO PACK
40.0000 meq | PACK | Freq: Once | ORAL | Status: AC
  Administered 2018-03-08: 40 meq via ORAL
  Filled 2018-03-08: qty 2

## 2018-03-08 NOTE — Discharge Summary (Signed)
[facility] -  at [facility]   PATIENT NAME: Monique Castro    MR#:  [id number]  DATE OF BIRTH:  04/07/1971  DATE OF ADMISSION:  03/06/2018 ADMITTING PHYSICIAN: Barbaraann Rondo, MD  DATE OF DISCHARGE: No discharge date for patient encounter.  PRIMARY CARE PHYSICIAN: Pearson Grippe, MD    ADMISSION DIAGNOSIS:  Sinus tachycardia [R00.0] Hypokalemia [E87.6] Prolonged QT interval [R94.31] Elevated lactic acid level [R79.89] Acute respiratory failure, unspecified whether with hypoxia or hypercapnia (HCC) [J96.00] Drug overdose, undetermined intent, initial encounter [T50.904A] Altered mental status, unspecified altered mental status type [R41.82] Anemia, unspecified type [D64.9]  DISCHARGE DIAGNOSIS:  Principal Problem:   Benzodiazepine abuse (HCC) Active Problems:   Overdose   Polypharmacy   SECONDARY DIAGNOSIS:   Past Medical History:  Diagnosis Date  . Allergy   . Anxiety   . Arthritis   . Benzodiazepine abuse (HCC) 03/07/2018  . Depression   . Dysrhythmia    diagnosed with tachycardia in but not severe enough to require medications-per patient  . Fibromyalgia   . GERD (gastroesophageal reflux disease)   . Neuromuscular disorder (HCC)   . Strep throat     HOSPITAL COURSE:  #Acute encephalopathy Resolved  #Drug overdose - unintentional Psychiatry did see patient while in house-no intervention was recommended  #Fever probably from aspiration pneumonia Resolving  Treated with Unasyn while in house-we will prescribe ampicillin/Flagyl to complete antibiotic course going forward as patient was demanding to go home on day of discharge   #Chronic anxiety/depression Stable Continue home psychotropic regiment  #Hypokalemia/hypomagnesemia  repleted  #Chronic pain syndrome with history of fibromyalgia Stable on current regiment  DISCHARGE CONDITIONS:   stable  CONSULTS OBTAINED:  Treatment Team:  Barbaraann Rondo,  MD Clapacs, Jackquline Denmark, MD  DRUG ALLERGIES:   Allergies  Allergen Reactions  . Sulfa Antibiotics Rash  . Amoxicillin-Pot Clavulanate Diarrhea    Has patient had a PCN reaction causing immediate rash, facial/tongue/throat swelling, SOB or lightheadedness with hypotension: No Has patient had a PCN reaction causing severe rash involving mucus membranes or skin necrosis: No Has patient had a PCN reaction that required hospitalization: No Has patient had a PCN reaction occurring within the last 10 years: #  #  #  YES  #  #  #  If all of the above answers are "NO", then may proceed with Cephalosporin use.   Marland Kitchen Doxycycline Nausea And Vomiting    GI Upset  . Macrobid [Nitrofurantoin Macrocrystal] Nausea Only    DISCHARGE MEDICATIONS:   Allergies as of 03/08/2018      Reactions   Sulfa Antibiotics Rash   Amoxicillin-pot Clavulanate Diarrhea   Has patient had a PCN reaction causing immediate rash, facial/tongue/throat swelling, SOB or lightheadedness with hypotension: No Has patient had a PCN reaction causing severe rash involving mucus membranes or skin necrosis: No Has patient had a PCN reaction that required hospitalization: No Has patient had a PCN reaction occurring within the last 10 years: #  #  #  YES  #  #  #  If all of the above answers are "NO", then may proceed with Cephalosporin use.   Doxycycline Nausea And Vomiting   GI Upset   Macrobid [nitrofurantoin Macrocrystal] Nausea Only      Medication List    TAKE these medications   ampicillin 500 MG capsule Commonly known as:  PRINCIPEN Take 1 capsule (500 mg total) by mouth 3 (three) times daily.   buPROPion 300 MG 24 hr tablet  Commonly known as:  WELLBUTRIN XL Take 300 mg every evening by mouth.   buPROPion 300 MG 24 hr tablet Commonly known as:  WELLBUTRIN XL Take 300 mg by mouth daily.   cholestyramine 4 GM/DOSE powder Commonly known as:  QUESTRAN Take by mouth.   clotrimazole-betamethasone cream Commonly known  as:  LOTRISONE APP EXT AA BID   desvenlafaxine 50 MG 24 hr tablet Commonly known as:  PRISTIQ Take 50 mg every evening by mouth.   diclofenac sodium 1 % Gel Commonly known as:  VOLTAREN Apply 2 g daily as needed topically (pain).   hydrOXYzine 25 MG capsule Commonly known as:  VISTARIL Take 1 capsule by mouth 2 (two) times daily as needed for anxiety.   lactobacillus acidophilus & bulgar chewable tablet Chew 1 tablet by mouth 3 (three) times daily with meals.   lisdexamfetamine 50 MG capsule Commonly known as:  VYVANSE Take 100 mg daily by mouth.   metroNIDAZOLE 500 MG tablet Commonly known as:  FLAGYL Take 1 tablet (500 mg total) by mouth 3 (three) times daily for 5 days.   Oxycodone HCl 20 MG Tabs Take 20 mg by mouth every 4 (four) hours as needed (severe pain). (score 7 to 10)   Oxycodone HCl 20 MG Tabs Take 1 tablet (20 mg total) every 4 (four) hours as needed by mouth for severe pain ((score 7 to 10)).   oxymorphone 10 MG tablet Commonly known as:  OPANA Limit 1 tab by mouth 3 - 5 times per day if tolerated   pantoprazole 40 MG tablet Commonly known as:  PROTONIX Take 40 mg every evening by mouth. Reported on 08/09/2015   QUEtiapine 50 MG tablet Commonly known as:  SEROQUEL Take 1 tablet by mouth at bedtime.   simvastatin 10 MG tablet Commonly known as:  ZOCOR Take 10 mg every evening by mouth.   tamsulosin 0.4 MG Caps capsule Commonly known as:  FLOMAX Take 0.4 mg at bedtime by mouth.   tiZANidine 4 MG tablet Commonly known as:  ZANAFLEX Take 1 tablet (4 mg total) at bedtime by mouth.   triamcinolone 55 MCG/ACT Aero nasal inhaler Commonly known as:  NASACORT Place 1 spray daily as needed into the nose (allergies). Reported on 07/11/2015   ZYRTEC-D ALLERGY & CONGESTION 5-120 MG tablet Generic drug:  cetirizine-pseudoephedrine Take 1 tablet daily as needed by mouth for allergies. Reported on 11/30/2015        DISCHARGE INSTRUCTIONS:    If you  experience worsening of your admission symptoms, develop shortness of breath, life threatening emergency, suicidal or homicidal thoughts you must seek medical attention immediately by calling 911 or calling your MD immediately  if symptoms less severe.  You Must read complete instructions/literature along with all the possible adverse reactions/side effects for all the Medicines you take and that have been prescribed to you. Take any new Medicines after you have completely understood and accept all the possible adverse reactions/side effects.   Please note  You were cared for by a hospitalist during your hospital stay. If you have any questions about your discharge medications or the care you received while you were in the hospital after you are discharged, you can call the unit and asked to speak with the hospitalist on call if the hospitalist that took care of you is not available. Once you are discharged, your primary care physician will handle any further medical issues. Please note that NO REFILLS for any discharge medications will be authorized once you  are discharged, as it is imperative that you return to your primary care physician (or establish a relationship with a primary care physician if you do not have one) for your aftercare needs so that they can reassess your need for medications and monitor your lab values.    Today   CHIEF COMPLAINT:   Chief Complaint  Patient presents with  . Ingestion    HISTORY OF PRESENT ILLNESS:  47 y.o. female with a known history of anxiety/depression/insomnia, DJD/OA, fibromyalgia, chronic pain p/w overdose, acute hypoxemic respiratory failure. Pt is already intubated at the time of my assessment. Hx limited, pt's family estranged. Pt reportedly w/ friend, starting taking Seroquel tablets to help w/ sleep. Friend told pt to stop, pt continued taking tablets, EMS called. Per EMS, pt medication bottles included Oxycodone, Vyvanse, Tizanidine, Flagyl, and  an unmarked bottle w/ round blue tablets (possibly Xanax). Did not respond to Narcan in field. Obtunded on arrival, poorly arousable, not protecting airway, intubated in ED. No further Hx/ROS is obtainable.    VITAL SIGNS:  Blood pressure 129/80, pulse 93, temperature 98.3 F (36.8 C), temperature source Oral, resp. rate 16, height 5\' 4"  (1.626 m), weight 55.6 kg, SpO2 100 %.  I/O:    Intake/Output Summary (Last 24 hours) at 03/08/2018 1142 Last data filed at 03/08/2018 1042 Gross per 24 hour  Intake 2700 ml  Output 5100 ml  Net -2400 ml    PHYSICAL EXAMINATION:  GENERAL:  48 y.o.-year-old patient lying in the bed with no acute distress.  EYES: Pupils equal, round, reactive to light and accommodation. No scleral icterus. Extraocular muscles intact.  HEENT: Head atraumatic, normocephalic. Oropharynx and nasopharynx clear.  NECK:  Supple, no jugular venous distention. No thyroid enlargement, no tenderness.  LUNGS: Normal breath sounds bilaterally, no wheezing, rales,rhonchi or crepitation. No use of accessory muscles of respiration.  CARDIOVASCULAR: S1, S2 normal. No murmurs, rubs, or gallops.  ABDOMEN: Soft, non-tender, non-distended. Bowel sounds present. No organomegaly or mass.  EXTREMITIES: No pedal edema, cyanosis, or clubbing.  NEUROLOGIC: Cranial nerves II through XII are intact. Muscle strength 5/5 in all extremities. Sensation intact. Gait not checked.  PSYCHIATRIC: The patient is alert and oriented x 3.  SKIN: No obvious rash, lesion, or ulcer.   DATA REVIEW:   CBC Recent Labs  Lab 03/08/18 0656  WBC 11.0*  HGB 13.6  HCT 42.4  PLT 304    Chemistries  Recent Labs  Lab 03/06/18 0319  03/08/18 0656  NA 139   < > 142  K 2.5*   < > 3.2*  CL 104   < > 106  CO2 25   < > 28  GLUCOSE 136*   < > 128*  BUN 8   < > 7  CREATININE 0.70   < > 0.82  CALCIUM 8.5*   < > 8.5*  MG 1.6*   < > 1.8  AST 21  --   --   ALT 16  --   --   ALKPHOS 88  --   --   BILITOT 0.5   --   --    < > = values in this interval not displayed.    Cardiac Enzymes No results for input(s): TROPONINI in the last 168 hours.  Microbiology Results  Results for orders placed or performed during the hospital encounter of 03/06/18  MRSA PCR Screening     Status: None   Collection Time: 03/06/18  6:51 AM  Result Value Ref Range Status  MRSA by PCR NEGATIVE NEGATIVE Final    Comment:        The GeneXpert MRSA Assay (FDA approved for NASAL specimens only), is one component of a comprehensive MRSA colonization surveillance program. It is not intended to diagnose MRSA infection nor to guide or monitor treatment for MRSA infections. Performed at [facility], [address] [place], [place] [address]   CULTURE, BLOOD (ROUTINE X 2) w Reflex to ID Panel     Status: None (Preliminary result)   Collection Time: 03/07/18  6:16 PM  Result Value Ref Range Status   Specimen Description BLOOD LT HAND  Final   Special Requests   Final    BOTTLES DRAWN AEROBIC AND ANAEROBIC Blood Culture results may not be optimal due to an inadequate volume of blood received in culture bottles   Culture   Final    NO GROWTH < 12 HOURS Performed at [facility], [address] [place], [place] [address]    Report Status PENDING  Incomplete  CULTURE, BLOOD (ROUTINE X 2) w Reflex to ID Panel     Status: None (Preliminary result)   Collection Time: 03/07/18  6:16 PM  Result Value Ref Range Status   Specimen Description BLOOD RT [facility]  Final   Special Requests   Final    BOTTLES DRAWN AEROBIC AND ANAEROBIC Blood Culture results may not be optimal due to an inadequate volume of blood received in culture bottles   Culture   Final    NO GROWTH < 12 HOURS Performed at [facility], [address], [place], [place] [address]    Report Status PENDING  Incomplete    RADIOLOGY:  Dg Chest Port 1 View  Result Date: 03/08/2018 CLINICAL DATA:  47 year old female with pneumonia.  EXAM: PORTABLE CHEST 1 VIEW COMPARISON:  03/07/2018 and earlier. FINDINGS: Extubated and enteric tube removed. Right subclavian central line remains in place. Stable to mildly larger lung volumes. Normal cardiac size and mediastinal contours. Allowing for portable technique the lungs are clear. Negative visible bowel gas pattern. Partially visible cervical spine fusion hardware. IMPRESSION: 1. Extubated and enteric tube removed. Stable right subclavian central line. 2.  No acute cardiopulmonary abnormality. Electronically Signed   By: Odessa Fleming M.D.   On: 03/08/2018 07:32   Dg Chest Port 1 View  Result Date: 03/07/2018 CLINICAL DATA:  Atelectasis. EXAM: PORTABLE CHEST 1 VIEW COMPARISON:  Radiograph of March 06, 2018. FINDINGS: The heart size and mediastinal contours are within normal limits. Endotracheal and nasogastric tubes are unchanged in position. Right subclavian catheter is unchanged. Both lungs are clear. The visualized skeletal structures are unremarkable. IMPRESSION: Stable support apparatus. No acute cardiopulmonary abnormality seen. Electronically Signed   By: Lupita Raider, M.D.   On: 03/07/2018 07:09    EKG:   Orders placed or performed during the hospital encounter of 03/06/18  . EKG 12-Lead  . EKG 12-Lead  . EKG 12-Lead  . EKG 12-Lead      Management plans discussed with the patient, family and they are in agreement.  CODE STATUS:     Code Status Orders  (From admission, onward)         Start     Ordered   03/06/18 0648  Full code  Continuous     03/06/18 0647        Code Status History    Date Active Date Inactive Code Status Order ID Comments User Context   03/27/2017 1557 03/29/2017 2249  Full Code [id number]  Tia Alert, MD Inpatient      TOTAL TIME TAKING CARE OF THIS PATIENT: 40 minutes.    Evelena Asa Rahmel Nedved M.D on 03/08/2018 at 11:42 AM  Between 7am to 6pm - Pager - [phone]  After 6pm go to [email or web address] - password [email or web address]  [employer]  [facility]  Office  [phone]  CC: Primary care physician; Pearson Grippe, MD   Note: This dictation was prepared with Dragon dictation along with smaller phrase technology. Any transcriptional errors that result from this process are unintentional.

## 2018-03-08 NOTE — Consult Note (Signed)
Pharmacy Electrolyte Monitoring Consult:  Pharmacy consulted to assist in monitoring and replacing electrolytes in this 47 y.o. female admitted on 03/06/2018 with overdose on Seroquel. QTc was 586 upon admission; has decreased to 465.  Labs:  Sodium (mmol/L)  Date Value  03/08/2018 142  07/02/2013 139   Potassium (mmol/L)  Date Value  03/08/2018 3.2 (L)  07/02/2013 2.8 (L)   Magnesium (mg/dL)  Date Value  40/98/1191 1.8   Phosphorus (mg/dL)  Date Value  47/82/9562 3.8   Calcium (mg/dL)  Date Value  13/12/6576 8.5 (L)   Calcium, Total (mg/dL)  Date Value  46/96/2952 8.7   Albumin (g/dL)  Date Value  84/13/2440 3.6  07/02/2013 3.6    Assessment/Plan:  Will replace for goal potassium ~ 4, goal magnesium ~ 2, and goal phosphorus > 2.5.   10/26: K 3.2 Mag 1.8 Phos 3.8 Scr 0.82 Will order KCL PO x 1.  Will obtain follow up electrolytes with am labs.   Pharmacy will continue to monitor and adjust per consult.   Fawzi Melman A 03/08/2018 10:09 AM

## 2018-03-12 LAB — CULTURE, BLOOD (ROUTINE X 2)
CULTURE: NO GROWTH
CULTURE: NO GROWTH

## 2018-06-24 ENCOUNTER — Telehealth: Payer: Self-pay | Admitting: Certified Nurse Midwife

## 2018-06-24 ENCOUNTER — Other Ambulatory Visit: Payer: Self-pay

## 2018-06-24 MED ORDER — FLUCONAZOLE 150 MG PO TABS: 150.0000 mg | ORAL_TABLET | Freq: Once | ORAL | 1 refills | Status: DC

## 2018-06-24 NOTE — Telephone Encounter (Signed)
The patient is asking for a refill of her diflucan, please advise, thanks.

## 2018-06-27 ENCOUNTER — Other Ambulatory Visit: Payer: Self-pay

## 2018-06-27 MED ORDER — METRONIDAZOLE 0.75 % VA GEL: 1.0000 | Freq: Every day | VAGINAL | 1 refills | Status: DC

## 2018-09-29 ENCOUNTER — Encounter: Payer: BLUE CROSS/BLUE SHIELD | Admitting: Certified Nurse Midwife

## 2018-10-14 ENCOUNTER — Telehealth: Payer: Self-pay | Admitting: Certified Nurse Midwife

## 2018-10-14 NOTE — Telephone Encounter (Signed)
This patient has a visit scheduled soon for vaginal discharge, is she still going to come in if I send this in? I just dont wont to send this in and then she does not show for her appointment? Or is she going to cancel appointment.   Thanks,  Pattricia Boss

## 2018-10-14 NOTE — Telephone Encounter (Signed)
The patient sent a MyChart message of the following request:  "Can Monique Castro call in 3 diflucan and metrodanizole tablets to [facility]?"  Please advise.

## 2018-10-15 ENCOUNTER — Other Ambulatory Visit: Payer: Self-pay

## 2018-10-15 ENCOUNTER — Encounter: Payer: BLUE CROSS/BLUE SHIELD | Admitting: Certified Nurse Midwife

## 2018-10-15 MED ORDER — METRONIDAZOLE 500 MG PO TABS: 500.0000 mg | ORAL_TABLET | Freq: Two times a day (BID) | ORAL | 0 refills | Status: DC

## 2018-10-15 MED ORDER — FLUCONAZOLE 150 MG PO TABS: 150.0000 mg | ORAL_TABLET | Freq: Once | ORAL | 1 refills | Status: AC

## 2018-11-03 ENCOUNTER — Encounter: Payer: BLUE CROSS/BLUE SHIELD | Admitting: Certified Nurse Midwife

## 2018-11-04 ENCOUNTER — Telehealth: Payer: Self-pay

## 2018-11-04 NOTE — Telephone Encounter (Signed)
Coronavirus (COVID-19) Are you at risk?  Are you at risk for the Coronavirus (COVID-19)?  To be considered HIGH RISK for Coronavirus (COVID-19), you have to meet the following criteria:  . Traveled to [place], [place], [place], [place] or [place]; or in the [place] to [place], [place], [place], or [place]; and have fever, cough, and shortness of breath within the last 2 weeks of travel OR . Been in close contact with a person diagnosed with COVID-19 within the last 2 weeks and have fever, cough, and shortness of breath . IF YOU DO NOT MEET THESE CRITERIA, YOU ARE CONSIDERED LOW RISK FOR COVID-19.  What to do if you are HIGH RISK for COVID-19?  Marland Kitchen If you are having a medical emergency, call 911. . Seek medical care right away. Before you go to a doctor's office, urgent care or emergency department, call ahead and tell them about your recent travel, contact with someone diagnosed with COVID-19, and your symptoms. You should receive instructions from your physician's office regarding next steps of care.  . When you arrive at healthcare provider, tell the healthcare staff immediately you have returned from visiting [place], [place], [place], [place] or [place]; or traveled in the [place] to [place], [place], [place], or [place]; in the last two weeks or you have been in close contact with a person diagnosed with COVID-19 in the last 2 weeks.   . Tell the health care staff about your symptoms: fever, cough and shortness of breath. . After you have been seen by a medical provider, you will be either: o Tested for (COVID-19) and discharged home on quarantine except to seek medical care if symptoms worsen, and asked to  - Stay home and avoid contact with others until you get your results (4-5 days)  - Avoid travel on public transportation if possible (such as bus, train, or airplane) or o Sent to the Emergency Department by EMS for evaluation, COVID-19 testing, and possible  admission depending on your condition and test results.  What to do if you are LOW RISK for COVID-19?  Reduce your risk of any infection by using the same precautions used for avoiding the common cold or flu:  Marland Kitchen Wash your hands often with soap and warm water for at least 20 seconds.  If soap and water are not readily available, use an alcohol-based hand sanitizer with at least 60% alcohol.  . If coughing or sneezing, cover your mouth and nose by coughing or sneezing into the elbow areas of your shirt or coat, into a tissue or into your sleeve (not your hands). . Avoid shaking hands with others and consider head nods or verbal greetings only. . Avoid touching your eyes, nose, or mouth with unwashed hands.  . Avoid close contact with people who are Monique Castro. . Avoid places or events with large numbers of people in one location, like concerts or sporting events. . Carefully consider travel plans you have or are making. . If you are planning any travel outside or inside the [place], visit the CDC's Travelers' Health webpage for the latest health notices. . If you have some symptoms but not all symptoms, continue to monitor at home and seek medical attention if your symptoms worsen. . If you are having a medical emergency, call 911.  11/04/18 SCREENING NEG SLS PT WAS IN [place] 2 WEEKS AGO ADDITIONAL HEALTHCARE OPTIONS FOR PATIENTS  [facility] / e-Visit: [email or web address]  [facility]: [phone]  [facility]: [phone]                   [facility]: [phone]  

## 2018-11-05 ENCOUNTER — Other Ambulatory Visit: Payer: Self-pay

## 2018-11-05 ENCOUNTER — Other Ambulatory Visit (HOSPITAL_COMMUNITY): Admission: RE | Admit: 2018-11-05 | Payer: BLUE CROSS/BLUE SHIELD | Source: Ambulatory Visit

## 2018-11-05 ENCOUNTER — Encounter: Payer: Self-pay | Admitting: Certified Nurse Midwife

## 2018-11-05 ENCOUNTER — Ambulatory Visit (INDEPENDENT_AMBULATORY_CARE_PROVIDER_SITE_OTHER): Payer: BLUE CROSS/BLUE SHIELD | Admitting: Certified Nurse Midwife

## 2018-11-05 VITALS — BP 122/89 | HR 99 | Ht 64.0 in | Wt 117.5 lb

## 2018-11-05 DIAGNOSIS — Z01419 Encounter for gynecological examination (general) (routine) without abnormal findings: Secondary | ICD-10-CM | POA: Diagnosis not present

## 2018-11-05 DIAGNOSIS — Z202 Contact with and (suspected) exposure to infections with a predominantly sexual mode of transmission: Secondary | ICD-10-CM | POA: Diagnosis not present

## 2018-11-05 DIAGNOSIS — Z1239 Encounter for other screening for malignant neoplasm of breast: Secondary | ICD-10-CM | POA: Diagnosis not present

## 2018-11-05 NOTE — Patient Instructions (Signed)
Preventive Care 40-64 Years, Female Preventive care refers to lifestyle choices and visits with your health care provider that can promote health and wellness. What does preventive care include?   A yearly physical exam. This is also called an annual well check.  Dental exams once or twice a year.  Routine eye exams. Ask your health care provider how often you should have your eyes checked.  Personal lifestyle choices, including: ? Daily care of your teeth and gums. ? Regular physical activity. ? Eating a healthy diet. ? Avoiding tobacco and drug use. ? Limiting alcohol use. ? Practicing safe sex. ? Taking low-dose aspirin daily starting at age 50. ? Taking vitamin and mineral supplements as recommended by your health care provider. What happens during an annual well check? The services and screenings done by your health care provider during your annual well check will depend on your age, overall health, lifestyle risk factors, and family history of disease. Counseling Your health care provider may ask you questions about your:  Alcohol use.  Tobacco use.  Drug use.  Emotional well-being.  Home and relationship well-being.  Sexual activity.  Eating habits.  Work and work environment.  Method of birth control.  Menstrual cycle.  Pregnancy history. Screening You may have the following tests or measurements:  Height, weight, and BMI.  Blood pressure.  Lipid and cholesterol levels. These may be checked every 5 years, or more frequently if you are over 50 years old.  Skin check.  Lung cancer screening. You may have this screening every year starting at age 55 if you have a 30-pack-year history of smoking and currently smoke or have quit within the past 15 years.  Colorectal cancer screening. All adults should have this screening starting at age 50 and continuing until age 75. Your health care provider may recommend screening at age 45. You will have tests every  1-10 years, depending on your results and the type of screening test. People at increased risk should start screening at an earlier age. Screening tests may include: ? Guaiac-based fecal occult blood testing. ? Fecal immunochemical test (FIT). ? Stool DNA test. ? Virtual colonoscopy. ? Sigmoidoscopy. During this test, a flexible tube with a tiny camera (sigmoidoscope) is used to examine your rectum and lower colon. The sigmoidoscope is inserted through your anus into your rectum and lower colon. ? Colonoscopy. During this test, a long, thin, flexible tube with a tiny camera (colonoscope) is used to examine your entire colon and rectum.  Hepatitis C blood test.  Hepatitis B blood test.  Sexually transmitted disease (STD) testing.  Diabetes screening. This is done by checking your blood sugar (glucose) after you have not eaten for a while (fasting). You may have this done every 1-3 years.  Mammogram. This may be done every 1-2 years. Talk to your health care provider about when you should start having regular mammograms. This may depend on whether you have a family history of breast cancer.  BRCA-related cancer screening. This may be done if you have a family history of breast, ovarian, tubal, or peritoneal cancers.  Pelvic exam and Pap test. This may be done every 3 years starting at age 21. Starting at age 30, this may be done every 5 years if you have a Pap test in combination with an HPV test.  Bone density scan. This is done to screen for osteoporosis. You may have this scan if you are at high risk for osteoporosis. Discuss your test results, treatment options,   and if necessary, the need for more tests with your health care provider. Vaccines Your health care provider may recommend certain vaccines, such as:  Influenza vaccine. This is recommended every year.  Tetanus, diphtheria, and acellular pertussis (Tdap, Td) vaccine. You may need a Td booster every 10 years.  Varicella  vaccine. You may need this if you have not been vaccinated.  Zoster vaccine. You may need this after age 38.  Measles, mumps, and rubella (MMR) vaccine. You may need at least one dose of MMR if you were born in 1957 or later. You may also need a second dose.  Pneumococcal 13-valent conjugate (PCV13) vaccine. You may need this if you have certain conditions and were not previously vaccinated.  Pneumococcal polysaccharide (PPSV23) vaccine. You may need one or two doses if you smoke cigarettes or if you have certain conditions.  Meningococcal vaccine. You may need this if you have certain conditions.  Hepatitis A vaccine. You may need this if you have certain conditions or if you travel or work in places where you may be exposed to hepatitis A.  Hepatitis B vaccine. You may need this if you have certain conditions or if you travel or work in places where you may be exposed to hepatitis B.  Haemophilus influenzae type b (Hib) vaccine. You may need this if you have certain conditions. Talk to your health care provider about which screenings and vaccines you need and how often you need them. This information is not intended to replace advice given to you by your health care provider. Make sure you discuss any questions you have with your health care provider. Document Released: 05/27/2015 Document Revised: 06/20/2017 Document Reviewed: 03/01/2015 Elsevier Interactive Patient Education  2019 [employer].

## 2018-11-05 NOTE — Progress Notes (Signed)
GYNECOLOGY ANNUAL PREVENTATIVE CARE ENCOUNTER NOTE  History:     Monique Castro is a 48 y.o. [id number] female here for a routine annual gynecologic exam.  Current complaints: has a new partner and would like STD testing today.   Denies abnormal vaginal bleeding, discharge, pelvic pain, problems with intercourse or other gynecologic concerns.    Gynecologic History No LMP recorded. Patient has had a hysterectomy. Contraception: hystorectomy Last Pap: 09/25/17. Results were: normal with negative HPV Last mammogram: June 2017. Results were: normal  Obstetric History OB History  Gravida Para Term Preterm AB Living  2 2 1 1   2   SAB TAB Ectopic Multiple Live Births          2    # Outcome Date GA Lbr Len/2nd Weight Sex Delivery Anes PTL Lv  2 Preterm 10/26/95 [id number]  6 lb 8 oz (2.948 kg) F Vag-Spont   LIV  1 Term 05/14/90 [id number]  6 lb 2.4 oz (2.79 kg) F Vag-Spont   LIV    Past Medical History:  Diagnosis Date  . Allergy   . Anxiety   . Arthritis   . Benzodiazepine abuse (HCC) 03/07/2018  . Depression   . Dysrhythmia    diagnosed with tachycardia in but not severe enough to require medications-per patient  . Fibromyalgia   . GERD (gastroesophageal reflux disease)   . Neuromuscular disorder (HCC)   . Strep throat     Past Surgical History:  Procedure Laterality Date  . ABDOMINAL HYSTERECTOMY    . APPENDECTOMY    . BACK SURGERY     ACDF 2011  . TUBAL LIGATION      Current Outpatient Medications on File Prior to Visit  Medication Sig Dispense Refill  . ampicillin (PRINCIPEN) 500 MG capsule Take 1 capsule (500 mg total) by mouth 3 (three) times daily. 15 capsule 0  . buPROPion (WELLBUTRIN XL) 300 MG 24 hr tablet Take 300 mg every evening by mouth.     Marland Kitchen. buPROPion (WELLBUTRIN XL) 300 MG 24 hr tablet Take 300 mg by mouth daily.     . cetirizine-pseudoephedrine (ZYRTEC-D ALLERGY & CONGESTION) 5-120 MG tablet Take 1 tablet daily as needed by mouth for allergies. Reported on  11/30/2015    . cholestyramine (QUESTRAN) 4 GM/DOSE powder Take by mouth.    . clotrimazole-betamethasone (LOTRISONE) cream APP EXT AA BID  2  . desvenlafaxine (PRISTIQ) 50 MG 24 hr tablet Take 50 mg every evening by mouth.     . diclofenac sodium (VOLTAREN) 1 % GEL Apply 2 g daily as needed topically (pain).    . hydrOXYzine (VISTARIL) 25 MG capsule Take 1 capsule by mouth 2 (two) times daily as needed for anxiety.  1  . lactobacillus acidophilus & bulgar (LACTINEX) chewable tablet Chew 1 tablet by mouth 3 (three) times daily with meals. 30 tablet 0  . lisdexamfetamine (VYVANSE) 50 MG capsule Take 100 mg daily by mouth.    . metroNIDAZOLE (FLAGYL) 500 MG tablet Take 1 tablet (500 mg total) by mouth 2 (two) times daily. 14 tablet 0  . metroNIDAZOLE (METROGEL) 0.75 % vaginal gel Place 1 Applicatorful vaginally at bedtime. Apply one applicatorful to vagina at bedtime for 5 days 70 g 1  . oxyCODONE 20 MG TABS Take 1 tablet (20 mg total) every 4 (four) hours as needed by mouth for severe pain ((score 7 to 10)). 40 tablet 0  . Oxycodone HCl 20 MG TABS Take 20 mg by mouth every 4 (four) hours  as needed (severe pain). (score 7 to 10)    . oxymorphone (OPANA) 10 MG tablet Limit 1 tab by mouth 3 - 5 times per day if tolerated (Patient not taking: Reported on 03/20/2017) 140 tablet 0  . pantoprazole (PROTONIX) 40 MG tablet Take 40 mg every evening by mouth. Reported on 08/09/2015    . QUEtiapine (SEROQUEL) 50 MG tablet Take 1 tablet by mouth at bedtime.  1  . simvastatin (ZOCOR) 10 MG tablet Take 10 mg every evening by mouth.    . tamsulosin (FLOMAX) 0.4 MG CAPS capsule Take 0.4 mg at bedtime by mouth.    Marland Kitchen. tiZANidine (ZANAFLEX) 4 MG tablet Take 1 tablet (4 mg total) at bedtime by mouth. 40 tablet 0  . triamcinolone (NASACORT) 55 MCG/ACT AERO nasal inhaler Place 1 spray daily as needed into the nose (allergies). Reported on 07/11/2015     No current facility-administered medications on file prior to visit.      Allergies  Allergen Reactions  . Sulfa Antibiotics Rash  . Amoxicillin-Pot Clavulanate Diarrhea    Has patient had a PCN reaction causing immediate rash, facial/tongue/throat swelling, SOB or lightheadedness with hypotension: No Has patient had a PCN reaction causing severe rash involving mucus membranes or skin necrosis: No Has patient had a PCN reaction that required hospitalization: No Has patient had a PCN reaction occurring within the last 10 years: #  #  #  YES  #  #  #  If all of the above answers are "NO", then may proceed with Cephalosporin use.   Marland Kitchen. Doxycycline Nausea And Vomiting    GI Upset  . Macrobid [Nitrofurantoin Macrocrystal] Nausea Only    Social History:  reports that she has been smoking cigarettes. She has been smoking about 0.50 packs per day. She has never used smokeless tobacco. She reports current alcohol use. She reports that she does not use drugs.  Family History  Problem Relation Age of Onset  . Alcohol abuse Father     The following portions of the patient's history were reviewed and updated as appropriate: allergies, current medications, past family history, past medical history, past social history, past surgical history and problem list.  Review of Systems Pertinent items noted in HPI and remainder of comprehensive ROS otherwise negative.  Physical Exam:  There were no vitals taken for this visit. CONSTITUTIONAL: Well-developed, well-nourished female in no acute distress.  HENT:  Normocephalic, atraumatic, External right and left ear normal. Oropharynx is clear and moist EYES: Conjunctivae and EOM are normal. Pupils are equal, round, and reactive to light. No scleral icterus.  NECK: Normal range of motion, supple, no masses.  Normal thyroid.  SKIN: Skin is warm and dry. No rash noted. Not diaphoretic. No erythema. No pallor. MUSCULOSKELETAL: Normal range of motion. No tenderness.  No cyanosis, clubbing, or edema.  2+ distal pulses. NEUROLOGIC:  Alert and oriented to person, place, and time. Normal reflexes, muscle tone coordination. No cranial nerve deficit noted. PSYCHIATRIC: Normal mood and affect. Normal behavior. Normal judgment and thought content. CARDIOVASCULAR: Normal heart rate noted, regular rhythm RESPIRATORY: Clear to auscultation bilaterally. Effort and breath sounds normal, no problems with respiration noted. BREASTS: Symmetric in size. No masses, skin changes, nipple drainage, or lymphadenopathy. ABDOMEN: Soft, normal bowel sounds, no distention noted.  No tenderness, rebound or guarding.  PELVIC: Normal appearing external genitalia; normal appearing vaginal mucosa and cervix.  No abnormal discharge noted.  Pap smear not indicated. , no other palpable masses,   Assessment  and Plan:  Annual Well Women GYN exam  Pap smear not indicted until 2024 Mammogram scheduled Labs: Hemoglobin A1c, Lipid profile - pt states she had done at her PCP yesterday. She is having STD testing today.  Routine preventative health maintenance measures emphasized. Please refer to After Visit Summary for other counseling recommendations.      Philip Aspen, CNM

## 2018-11-06 LAB — HSV 1 AND 2 IGM ABS, INDIRECT
HSV 1 IgM: <1:10 {titer}
HSV 2 IgM: <1:10 {titer}

## 2018-11-06 LAB — HIV ANTIBODY (ROUTINE TESTING W REFLEX): HIV Screen 4th Generation wRfx: NONREACTIVE

## 2018-11-06 LAB — RPR: RPR Ser Ql: NONREACTIVE

## 2018-11-06 LAB — HEPATITIS B SURFACE ANTIGEN: Hepatitis B Surface Ag: NEGATIVE

## 2018-11-07 LAB — CERVICOVAGINAL ANCILLARY ONLY
Chlamydia: NEGATIVE
Neisseria Gonorrhea: NEGATIVE
Trichomonas: NEGATIVE

## 2019-11-06 ENCOUNTER — Encounter: Payer: BLUE CROSS/BLUE SHIELD | Admitting: Certified Nurse Midwife

## 2019-11-12 ENCOUNTER — Encounter: Payer: Self-pay | Admitting: Certified Nurse Midwife

## 2020-01-29 ENCOUNTER — Other Ambulatory Visit: Payer: Self-pay | Admitting: Certified Nurse Midwife

## 2020-05-11 ENCOUNTER — Other Ambulatory Visit: Payer: Self-pay | Admitting: Certified Nurse Midwife

## 2020-09-30 ENCOUNTER — Other Ambulatory Visit: Payer: Self-pay | Admitting: Obstetrics and Gynecology

## 2020-09-30 DIAGNOSIS — Z13820 Encounter for screening for osteoporosis: Secondary | ICD-10-CM

## 2020-10-26 ENCOUNTER — Ambulatory Visit (INDEPENDENT_AMBULATORY_CARE_PROVIDER_SITE_OTHER): Payer: Commercial Managed Care - PPO

## 2020-10-26 ENCOUNTER — Ambulatory Visit
Admission: EM | Admit: 2020-10-26 | Discharge: 2020-10-26 | Disposition: A | Discharge status: Home / Self Care | Payer: Commercial Managed Care - PPO

## 2020-10-26 ENCOUNTER — Other Ambulatory Visit: Payer: Self-pay

## 2020-10-26 DIAGNOSIS — M25531 Pain in right wrist: Secondary | ICD-10-CM | POA: Diagnosis not present

## 2020-10-26 DIAGNOSIS — T148XXA Other injury of unspecified body region, initial encounter: Secondary | ICD-10-CM

## 2020-10-26 DIAGNOSIS — S63501A Unspecified sprain of right wrist, initial encounter: Secondary | ICD-10-CM | POA: Diagnosis not present

## 2020-10-26 DIAGNOSIS — M545 Low back pain, unspecified: Secondary | ICD-10-CM

## 2020-10-26 NOTE — Discharge Instructions (Addendum)
Wear wrist brace over the next 2 weeks to limit motion at wrist Continue Tylenol, tizanidine, oxycodone as prescribed/needed Ice and rest Follow-up if not improving or worsening
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
## 2020-11-09 ENCOUNTER — Other Ambulatory Visit: Payer: Commercial Managed Care - PPO

## 2021-04-18 ENCOUNTER — Other Ambulatory Visit: Payer: Commercial Managed Care - PPO

## 2021-05-01 ENCOUNTER — Ambulatory Visit
Admission: EM | Admit: 2021-05-01 | Discharge: 2021-05-01 | Discharge status: Against Medical Advice | Payer: BC Managed Care – PPO

## 2021-05-01 ENCOUNTER — Ambulatory Visit
Admission: EM | Admit: 2021-05-01 | Discharge: 2021-05-01 | Disposition: A | Discharge status: Home / Self Care | Payer: BC Managed Care – PPO | Attending: Physician Assistant | Admitting: Physician Assistant

## 2021-05-01 ENCOUNTER — Ambulatory Visit (INDEPENDENT_AMBULATORY_CARE_PROVIDER_SITE_OTHER): Payer: BC Managed Care – PPO

## 2021-05-01 ENCOUNTER — Other Ambulatory Visit: Payer: Self-pay

## 2021-05-01 DIAGNOSIS — S2232XA Fracture of one rib, left side, initial encounter for closed fracture: Secondary | ICD-10-CM

## 2021-05-01 DIAGNOSIS — S0993XA Unspecified injury of face, initial encounter: Secondary | ICD-10-CM | POA: Diagnosis not present
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
## 2021-05-02 ENCOUNTER — Encounter: Payer: Self-pay | Admitting: Physician Assistant
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
## 2021-10-04 ENCOUNTER — Ambulatory Visit: Payer: BC Managed Care – PPO | Admitting: Orthopaedic Surgery

## 2021-10-05 ENCOUNTER — Ambulatory Visit: Payer: Self-pay

## 2021-10-05 ENCOUNTER — Encounter: Payer: Self-pay | Admitting: Orthopedic Surgery

## 2021-10-05 ENCOUNTER — Ambulatory Visit (INDEPENDENT_AMBULATORY_CARE_PROVIDER_SITE_OTHER): Payer: 59 | Admitting: Orthopedic Surgery

## 2021-10-05 DIAGNOSIS — M79641 Pain in right hand: Secondary | ICD-10-CM

## 2021-10-05 MED ORDER — MELOXICAM 7.5 MG PO TABS: 7.5000 mg | ORAL_TABLET | Freq: Every day | ORAL | 0 refills | Status: DC
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
## 2021-10-06 ENCOUNTER — Other Ambulatory Visit: Payer: Self-pay | Admitting: Orthopedic Surgery

## 2021-10-06 DIAGNOSIS — M79641 Pain in right hand: Secondary | ICD-10-CM

## 2021-10-06 MED ORDER — MELOXICAM 7.5 MG PO TABS: 7.5000 mg | ORAL_TABLET | Freq: Every day | ORAL | 0 refills | Status: AC

## 2021-10-06 NOTE — Telephone Encounter (Signed)
Pt called requesting a refill of meloxicam. Pt states Dr Emilee Hero did not send to [facility] on [address]. Please call pt at [phone] when called in.

## 2021-10-11 ENCOUNTER — Encounter: Payer: Self-pay | Admitting: Rehabilitative and Restorative Service Providers"

## 2021-10-11 ENCOUNTER — Other Ambulatory Visit: Payer: Self-pay

## 2021-10-11 ENCOUNTER — Ambulatory Visit (INDEPENDENT_AMBULATORY_CARE_PROVIDER_SITE_OTHER): Payer: 59 | Admitting: Rehabilitative and Restorative Service Providers"

## 2021-10-11 DIAGNOSIS — M6281 Muscle weakness (generalized): Secondary | ICD-10-CM

## 2021-10-11 DIAGNOSIS — M25531 Pain in right wrist: Secondary | ICD-10-CM | POA: Diagnosis not present

## 2021-10-11 DIAGNOSIS — M25631 Stiffness of right wrist, not elsewhere classified: Secondary | ICD-10-CM | POA: Diagnosis not present

## 2021-10-11 NOTE — Therapy (Signed)
OUTPATIENT OCCUPATIONAL THERAPY ORTHO EVALUATION  Patient Name: Monique Castro MRN: [id number] DOB:06-04-1970, 51 y.o., female Today's Date: 10/11/2021  PCP: Dr. Pearson GrippeJames Kim REFERRING PROVIDER: Dr. Marlyne Beardsharlie Benfield    OT End of Session - 10/11/21 1609     Visit Number 1    Number of Visits 6    Date for OT Re-Evaluation 11/03/21    Authorization Type [employer]    OT Start Time 1609    OT Stop Time 1651    OT Time Calculation (min) 42 min    Equipment Utilized During Treatment t-band    Activity Tolerance Patient tolerated treatment well;Patient limited by pain;Patient limited by fatigue    [facility] assessed/performed             Past Medical History:  Diagnosis Date   Allergy    Anxiety    Arthritis    Benzodiazepine abuse (HCC) 03/07/2018   Depression    Dysrhythmia    diagnosed with tachycardia in but not severe enough to require medications-per patient   Fibromyalgia    GERD (gastroesophageal reflux disease)    Neuromuscular disorder (HCC)    Strep throat    Past Surgical History:  Procedure Laterality Date   ABDOMINAL HYSTERECTOMY     APPENDECTOMY     BACK SURGERY     ACDF 2011   CERVICAL FUSION  06/27/2017   TUBAL LIGATION     Patient Active Problem List   Diagnosis Date Noted   Benzodiazepine abuse (HCC) 03/07/2018   Polypharmacy 03/07/2018   Overdose 03/06/2018   S/P lumbar spinal fusion 03/27/2017   Epicondylitis 12/29/2015   Epicondylitis syndrome of elbow 02/03/2015   DDD (degenerative disc disease), lumbar 12/21/2014   Facet syndrome, lumbar 12/21/2014   Lumbar radiculopathy 12/21/2014   Sacroiliac joint dysfunction 12/21/2014   Fast heart beat 05/04/2014   Arthropathy of lumbar facet joint 01/28/2014   Adaptive colitis 08/11/2013   Neuropathy 07/14/2013   Interdigital neuralgia 06/17/2013   Clinical depression 06/03/2013   Fibrositis 04/18/2012    ONSET DATE: 09/19/21 DOI (fall)  REFERRING DIAG:  M79.641 (ICD-10-CM) - Pain in right hand  THERAPY DIAG:  Pain in right wrist  Stiffness of right wrist, not elsewhere classified  Muscle weakness (generalized)  Rationale for Evaluation and Treatment Rehabilitation  SUBJECTIVE:   SUBJECTIVE STATEMENT: She states slipping and falling in the bathroom and putting out right arm, hitting right hand/wrist.  She states she think she broke it, and was seen the next day when x-rays showed a fracture, but she states she didn't see the hand specialist for ~15 more days and the fractures were healed. She also describes something like a self-reduction of subluxation in hand/wrist as well after her injury.  She states opening doors, opening jars (deviations) are worst motions, pain esp. in middle and ring fingers. [place], and may not be able to do a full month of therapy.    PERTINENT HISTORY: Per MD: "Diffuse right hand weakness after fall.  Negative x-rays.  No swelling.  Would like to work on hand strengthening." PmHx contains past lumbar spinal fusion, past epicondylitis, and neuropathy among others.    PRECAUTIONS: Other: past fusions in neck and lower lumbar  WEIGHT BEARING RESTRICTIONS No  PAIN:  Are you having pain? Yes Rating: 0/10 at rest today, up to 6-7/10 in past week when opening jars, etc.   FALLS: Has patient fallen in last 6  months? Yes. Number of falls at least 1 (this fall in bathroom)  PLOF: Independent  PATIENT GOALS To get her wrist feeling well enough to start packing to move to [place].    OBJECTIVE:   HAND DOMINANCE: Right   ADLs: Overall ADLs: States decreased ability to grab, hold household objects, pain and inability to pen containers, perform all FMS tasks, etc.    FUNCTIONAL OUTCOME MEASURES: Eval: Patient Specific Functional Scale: 7 (open doors, open jars, shifting gears)   UPPER EXTREMITY ROM    Eval: full fist, full opposition in right hand, wrist tight in  flex/ext  Active ROM Right eval  Wrist flexion 62  Wrist extension 50  Wrist ulnar deviation 37  Wrist radial deviation 32  Wrist pronation 78  Wrist supination 85  (Blank rows = not tested)    UPPER EXTREMITY MMT:    Eval: generally decreased in right wrist and c/o some pain on average ~4/5 MMT slight pain  MMT Right TBD  Wrist flexion   Wrist extension   Wrist ulnar deviation   Wrist radial deviation   Wrist pronation   Wrist supination   (Blank rows = not tested)  HAND FUNCTION: Eval: Grip strength Right: 20 lbs painful, Left: 53 lbs   COORDINATION:  Eval: no significant FMS deficits today but GMS dynamic wrist motions somewhat painful and reduced.   SENSATION: Eval:  Light touch intact, no c/o  EDEMA:   Eval: She had very mild swelling over dorsal scapholunate joint of right wrist compared to Lt wrist  COGNITION: Overall cognitive status: WFL for evaluation today   OBSERVATIONS:   Eval: She had very mild swelling and tenderness to palpation over dorsal scapholunate joint of right wrist. Dynamic wrist motions (sup/pronation with wrist deviation or flexion) were somewhat painful, presents like some minor, painful instability with functional motion. Rt thumb also mild positive grind test. Neg Finkelstein's test.    TODAY'S TREATMENT:  Eval: OT provides HEP for wrist and forearm tightness and to increase wrist/carpal stability as well as work on thumb grinding/tightness.  Exercises (and activities like Dart Thrower's Motion) listed below were individually educated on, first OT did manually or demo'd, then pt demo's back well for all- none increased pain levels.   Exercises - Wrist Pronation Stretch  - 4-6 x daily - 1 sets - 10-15 reps - Seated Wrist Flexion with Overpressure  - 3-4 x daily - 3-5 reps - 15 sec hold - Wrist Extension Stretch Pronated  - 3-4 x daily - 3-5 reps - 15 hold - Wrist Prayer Stretch  - 3-4 x daily - 3-5 reps - 15 sec hold - Seated Thumb MP  Flexion PROM  - 4-6 x daily - 1 sets - 10-15 reps - Wrist AROM Dart Throwers Motion  - 3-4 x daily - 1-2 sets - 10-15 reps - Wrist AROM Dart Throwers Motion with Resistance  - 2-3 x daily - 5-10 reps   PATIENT EDUCATION: Education details: See tx section above for details  Person educated: Patient Education method: Engineer, structural, Teach back, Handouts  Education comprehension: States and demonstrates understanding, Additional Education required    HOME EXERCISE PROGRAM: Access Code: [email or web address] URL: [email or web address] Prepared by: Fannie Knee   GOALS: Goals reviewed with patient? Yes   SHORT TERM GOALS: (STG required if POC>30 days)  Pt will demo/state understanding of initial HEP to improve pain levels and prerequisite motion. Target date: 10/20/21 Goal status: INITIAL   LONG TERM GOALS:  Pt will improve  functional ability by decreased impairment per PSFS assessment from 7 to 8.5 or better, for better quality of life. Target date: 11/03/21 Goal status: INITIAL  2.  Pt will improve grip strength in right dom hand from 20lbs to at least 40lbs for functional use at home and in IADLs. Target date: 11/03/21 Goal status: INITIAL  3.  Pt will improve A/ROM in right wrist flex/ext from 62/50 to at least 65/60, to have better functional motion for tasks like reach and grasp.  Target date: 11/03/21 Goal status: INITIAL  4.  Pt will improve strength in right wrist flexion to at least 4+/5 MMT to have increased functional ability to carry out selfcare and higher-level homecare tasks with no difficulty. Target date: 11/03/21 Goal status: INITIAL  5.  Pt will decrease pain at worst from 6-7/10 to 2-3/10 or better to have better sleep and occupational participation in daily roles. Target date: 11/03/21 Goal status: INITIAL   ASSESSMENT:  CLINICAL IMPRESSION: Patient is a 51 y.o. female who was seen today for occupational therapy evaluation for right wrist pain  and decreased functional ability.   PERFORMANCE DEFICITS in functional skills including IADLs, coordination, proprioception, ROM, strength, pain, flexibility, GMC, body mechanics, endurance, and UE functional use and psychosocial skills including coping strategies and habits.   IMPAIRMENTS are limiting patient from IADLs.   COMORBIDITIES has co-morbidities such as multiple fusions in lumbar and cervical spine, fibromyalgia, chronic pain, and more   that affects occupational performance. Patient will benefit from skilled OT to address above impairments and improve overall function.  MODIFICATION OR ASSISTANCE TO COMPLETE EVALUATION: No modification of tasks or assist necessary to complete an evaluation.  OT OCCUPATIONAL PROFILE AND HISTORY: Detailed assessment: Review of records and additional review of physical, cognitive, psychosocial history related to current functional performance.  CLINICAL DECISION MAKING: LOW - limited treatment options, no task modification necessary  REHAB POTENTIAL: Good  EVALUATION COMPLEXITY: Low      PLAN: OT FREQUENCY: 1-2x/week   OT DURATION: 3 weeks (through 11/03/21)  PLANNED INTERVENTIONS: therapeutic exercise, therapeutic activity, neuromuscular re-education, manual therapy, passive range of motion, splinting, ultrasound, fluidotherapy, compression bandaging, moist heat, cryotherapy, patient/family education, and coping strategies training  RECOMMENDED OTHER SERVICES: none now   CONSULTED AND AGREED WITH PLAN OF CARE: Patient  PLAN FOR NEXT SESSION: Check HEP, motion, pain & start hand strength PRN, dynamic/proprioceptive strengthen of wrist, check need for soft wrist strap as well for heavy/moving tasks.     Fannie Knee, OTR/L, CHT 10/11/2021, 5:17 PM

## 2021-10-18 ENCOUNTER — Encounter: Payer: 59 | Admitting: Rehabilitative and Restorative Service Providers"

## 2021-10-18 NOTE — Therapy (Incomplete)
OUTPATIENT OCCUPATIONAL THERAPY TREATMENT NOTE   Patient Name: Monique Castro MRN: [id number] DOB:December 25, 1970, 51 y.o., female Today's Date: 10/18/2021  PCP: Dr. Jani Gravel REFERRING PROVIDER: Dr. Sherilyn Cooter   END OF SESSION:    Past Medical History:  Diagnosis Date   Allergy    Anxiety    Arthritis    Benzodiazepine abuse ([place]) 03/07/2018   Depression    Dysrhythmia    diagnosed with tachycardia in but not severe enough to require medications-per patient   Fibromyalgia    GERD (gastroesophageal reflux disease)    Neuromuscular disorder ([place])    Strep throat    Past Surgical History:  Procedure Laterality Date   ABDOMINAL HYSTERECTOMY     APPENDECTOMY     BACK SURGERY     ACDF 2011   CERVICAL FUSION  06/27/2017   TUBAL LIGATION     Patient Active Problem List   Diagnosis Date Noted   Benzodiazepine abuse ([place]) 03/07/2018   Polypharmacy 03/07/2018   Overdose 03/06/2018   S/P lumbar spinal fusion 03/27/2017   Epicondylitis 12/29/2015   Epicondylitis syndrome of elbow 02/03/2015   DDD (degenerative disc disease), lumbar 12/21/2014   Facet syndrome, lumbar 12/21/2014   Lumbar radiculopathy 12/21/2014   Sacroiliac joint dysfunction 12/21/2014   Fast heart beat 05/04/2014   Arthropathy of lumbar facet joint 01/28/2014   Adaptive colitis 08/11/2013   Neuropathy 07/14/2013   Interdigital neuralgia 06/17/2013   Clinical depression 06/03/2013   Fibrositis 04/18/2012    ONSET DATE: 09/19/21 DOI (fall)   REFERRING DIAG: M79.641 (ICD-10-CM) - Pain in right hand  THERAPY DIAG:  No diagnosis found.  Rationale for Evaluation and Treatment Rehabilitation  PERTINENT HISTORY: Per MD: "Diffuse right hand weakness after fall.  Negative x-rays.  No swelling.  Would like to work on hand strengthening." PmHx contains past lumbar spinal fusion, past epicondylitis, and neuropathy among others.     PRECAUTIONS: Other: past fusions in neck and lower lumbar   WEIGHT BEARING  RESTRICTIONS No  SUBJECTIVE:  She states ***  PAIN:  Are you having pain? *** Rating: ***/10 at rest now    OBJECTIVE: (All objective assessments below are from initial evaluation on: 10/11/21 unless otherwise specified.)   HAND DOMINANCE: Right    ADLs: Overall ADLs: States decreased ability to grab, hold household objects, pain and inability to pen containers, perform all FMS tasks, etc.      FUNCTIONAL OUTCOME MEASURES: Eval: Patient Specific Functional Scale: 7 (open doors, open jars, shifting gears)     UPPER EXTREMITY ROM    Eval: full fist, full opposition in right hand, wrist tight in flex/ext   Active ROM Right eval Right 10/18/21  Wrist flexion 62   Wrist extension 50 ***  Wrist ulnar deviation 37   Wrist radial deviation 32   Wrist pronation 78   Wrist supination 85   (Blank rows = not tested)       UPPER EXTREMITY MMT:    Eval: generally decreased in right wrist and c/o some pain on average ~4/5 MMT slight pain   MMT Right 10/18/21  Wrist flexion    Wrist extension    Wrist ulnar deviation    Wrist radial deviation    Wrist pronation    Wrist supination    (Blank rows = not tested)   HAND FUNCTION: Eval: Grip strength Right: 20 lbs painful, Left: 53 lbs    COORDINATION:  Eval: no significant FMS deficits today but GMS dynamic wrist motions somewhat  painful and reduced.    SENSATION: Eval:  Light touch intact, no c/o   EDEMA:             Eval: She had very mild swelling over dorsal scapholunate joint of right wrist compared to Lt wrist   COGNITION: Overall cognitive status: WFL for evaluation today    OBSERVATIONS:             Eval: She had very mild swelling and tenderness to palpation over dorsal scapholunate joint of right wrist. Dynamic wrist motions (sup/pronation with wrist deviation or flexion) were somewhat painful, presents like some minor, painful instability with functional motion. Rt thumb also mild positive grind test. Neg  Finkelstein's test.      TODAY'S TREATMENT:  10/18/21: ***   Eval: OT provides HEP for wrist and forearm tightness and to increase wrist/carpal stability as well as work on thumb grinding/tightness.  Exercises (and activities like Dart Thrower's Motion) listed below were individually educated on, first OT did manually or demo'd, then pt demo's back well for all- none increased pain levels.    Exercises - Wrist Pronation Stretch  - 4-6 x daily - 1 sets - 10-15 reps - Seated Wrist Flexion with Overpressure  - 3-4 x daily - 3-5 reps - 15 sec hold - Wrist Extension Stretch Pronated  - 3-4 x daily - 3-5 reps - 15 hold - Wrist Prayer Stretch  - 3-4 x daily - 3-5 reps - 15 sec hold - Seated Thumb MP Flexion PROM  - 4-6 x daily - 1 sets - 10-15 reps - Wrist AROM Dart Throwers Motion  - 3-4 x daily - 1-2 sets - 10-15 reps - Wrist AROM Dart Throwers Motion with Resistance  - 2-3 x daily - 5-10 reps     PATIENT EDUCATION: Education details: See tx section above for details  Person educated: Patient Education method: Veterinary surgeon, Teach back, Handouts  Education comprehension: States and demonstrates understanding, Additional Education required      HOME EXERCISE PROGRAM: Access Code: [email or web address] URL: [email or web address] Prepared by: Benito Mccreedy     GOALS: Goals reviewed with patient? Yes     SHORT TERM GOALS: (STG required if POC>30 days)   Pt will demo/state understanding of initial HEP to improve pain levels and prerequisite motion. Target date: 10/20/21 Goal status: INITIAL     LONG TERM GOALS:   Pt will improve functional ability by decreased impairment per PSFS assessment from 7 to 8.5 or better, for better quality of life. Target date: 11/03/21 Goal status: INITIAL   2.  Pt will improve grip strength in right dom hand from 20lbs to at least 40lbs for functional use at home and in IADLs. Target date: 11/03/21 Goal status: INITIAL   3.  Pt will improve  A/ROM in right wrist flex/ext from 62/50 to at least 65/60, to have better functional motion for tasks like reach and grasp.  Target date: 11/03/21 Goal status: INITIAL   4.  Pt will improve strength in right wrist flexion to at least 4+/5 MMT to have increased functional ability to carry out selfcare and higher-level homecare tasks with no difficulty. Target date: 11/03/21 Goal status: INITIAL   5.  Pt will decrease pain at worst from 6-7/10 to 2-3/10 or better to have better sleep and occupational participation in daily roles. Target date: 11/03/21 Goal status: INITIAL     ASSESSMENT:   CLINICAL IMPRESSION: 10/18/21: ***  Eval: Patient is a 51 y.o. female who was seen  today for occupational therapy evaluation for right wrist pain and decreased functional ability.      PLAN: OT FREQUENCY: 1-2x/week    OT DURATION: 3 weeks (through 11/03/21)   PLANNED INTERVENTIONS: therapeutic exercise, therapeutic activity, neuromuscular re-education, manual therapy, passive range of motion, splinting, ultrasound, fluidotherapy, compression bandaging, moist heat, cryotherapy, patient/family education, and coping strategies training   RECOMMENDED OTHER SERVICES: none now    CONSULTED AND AGREED WITH PLAN OF CARE: Patient   PLAN FOR NEXT SESSION:  ***  Check HEP, motion, pain & start hand strength PRN, dynamic/proprioceptive strengthen of wrist, check need for soft wrist strap as well for heavy/moving tasks.       Benito Mccreedy, OTR/L, CHT 10/18/2021, 10:02 AM

## 2021-10-19 ENCOUNTER — Encounter: Payer: Self-pay | Admitting: Rehabilitative and Restorative Service Providers"

## 2021-10-19 ENCOUNTER — Ambulatory Visit (INDEPENDENT_AMBULATORY_CARE_PROVIDER_SITE_OTHER): Payer: 59 | Admitting: Rehabilitative and Restorative Service Providers"

## 2021-10-19 DIAGNOSIS — M6281 Muscle weakness (generalized): Secondary | ICD-10-CM | POA: Diagnosis not present

## 2021-10-19 DIAGNOSIS — M25531 Pain in right wrist: Secondary | ICD-10-CM | POA: Diagnosis not present

## 2021-10-19 DIAGNOSIS — M25631 Stiffness of right wrist, not elsewhere classified: Secondary | ICD-10-CM

## 2021-10-19 NOTE — Therapy (Addendum)
OUTPATIENT OCCUPATIONAL THERAPY TREATMENT & DISCHARGE NOTE   Patient Name: Monique Castro MRN: [id number] DOB:01-18-1971, 51 y.o., female Today's Date: 01/18/2022  PCP: Dr. Pearson Grippe REFERRING PROVIDER: Dr. Marlyne Beards   END OF SESSION:     Past Medical History:  Diagnosis Date   Allergy    Anxiety    Arthritis    Benzodiazepine abuse ([facility]) 03/07/2018   Depression    Dysrhythmia    diagnosed with tachycardia in but not severe enough to require medications-per patient   Fibromyalgia    GERD (gastroesophageal reflux disease)    Neuromuscular disorder ([facility])    Strep throat    Past Surgical History:  Procedure Laterality Date   ABDOMINAL HYSTERECTOMY     APPENDECTOMY     BACK SURGERY     ACDF 2011   CERVICAL FUSION  06/27/2017   TUBAL LIGATION     Patient Active Problem List   Diagnosis Date Noted   Benzodiazepine abuse ([facility]) 03/07/2018   Polypharmacy 03/07/2018   Overdose 03/06/2018   S/P lumbar spinal fusion 03/27/2017   Epicondylitis 12/29/2015   Epicondylitis syndrome of elbow 02/03/2015   DDD (degenerative disc disease), lumbar 12/21/2014   Facet syndrome, lumbar 12/21/2014   Lumbar radiculopathy 12/21/2014   Sacroiliac joint dysfunction 12/21/2014   Fast heart beat 05/04/2014   Arthropathy of lumbar facet joint 01/28/2014   Adaptive colitis 08/11/2013   Neuropathy 07/14/2013   Interdigital neuralgia 06/17/2013   Clinical depression 06/03/2013   Fibrositis 04/18/2012    ONSET DATE: 09/19/21 DOI (fall)   REFERRING DIAG: M79.641 (ICD-10-CM) - Pain in right hand  THERAPY DIAG:  Pain in right wrist  Stiffness of right wrist, not elsewhere classified  Muscle weakness (generalized)  Rationale for Evaluation and Treatment Rehabilitation  PERTINENT HISTORY: Per MD: "Diffuse right hand weakness after fall.  Negative x-rays.  No swelling.  Would like to work on hand strengthening." PmHx contains past lumbar spinal fusion, past epicondylitis, and  neuropathy among others.     PRECAUTIONS: Other: past fusions in neck and lower lumbar   WEIGHT BEARING RESTRICTIONS No  SUBJECTIVE:  She states having a lot of severe pain in the PIP J knuckles of right middle and ring finer today, does not complain of significant wrist pain now. She states not doing her HEP yet, as asked.   PAIN:  Are you having pain? Yes Rating: 8/10 at rest now    OBJECTIVE: (All objective assessments below are from initial evaluation on: 10/11/21 unless otherwise specified.)   HAND DOMINANCE: Right    ADLs: Overall ADLs: States decreased ability to grab, hold household objects, pain and inability to pen containers, perform all FMS tasks, etc.      FUNCTIONAL OUTCOME MEASURES: Eval: Patient Specific Functional Scale: 7 (open doors, open jars, shifting gears)     UPPER EXTREMITY ROM    Eval: full fist, full opposition in right hand, wrist tight in flex/ext   Active ROM Right eval  Wrist flexion 62  Wrist extension 50  Wrist ulnar deviation 37  Wrist radial deviation 32  Wrist pronation 78  Wrist supination 85  (Blank rows = not tested)       UPPER EXTREMITY MMT:    Eval: generally decreased in right wrist and c/o some pain on average ~4/5 MMT slight pain   MMT Right TBD  Wrist flexion    Wrist extension    Wrist ulnar deviation    Wrist radial deviation    Wrist pronation  Wrist supination    (Blank rows = not tested)   HAND FUNCTION: Eval: Grip strength Right: 20 lbs painful, Left: 53 lbs    COORDINATION:  Eval: no significant FMS deficits today but GMS dynamic wrist motions somewhat painful and reduced.    SENSATION: Eval:  Light touch intact, no c/o   EDEMA:             Eval: She had very mild swelling over dorsal scapholunate joint of right wrist compared to Lt wrist   COGNITION: Overall cognitive status: WFL for evaluation today    OBSERVATIONS:             Eval: She had very mild swelling and tenderness to palpation over  dorsal scapholunate joint of right wrist. Dynamic wrist motions (sup/pronation with wrist deviation or flexion) were somewhat painful, presents like some minor, painful instability with functional motion. Rt thumb also mild positive grind test. Neg Finkelstein's test.      TODAY'S TREATMENT:  10/19/21: Due to high pain reports, OT focuses on pain relief. We started with 5 mins MH (this helps relieve pain and doesn't cause irritation) while concurrently going over HEP exercises. She states not doing any yet (which is not a good sign).  OT then does light IASTM to dorsum of fingers, wrist and forearm to mobilize tissues, then does manual stretches to wrist in 4 planes, each finger (digits 2-5) in intrinsic minus position as well as at PIP Js where most pain was reported. She states feeling much better after these manual techniques (only having mild pain at end).  OT edu briefly on these "hook" and PIP J stretches gently, and to avoid strength on arthritis flare up days. She states understanding. To help manage wrist pains, as mentioned at eval, OT fabricates a soft supportive wrist strap and she shows ability to put on/off, states feels better when wearing/support.  (For 7 mins of today's session time, OT was not present (MD was speaking with OT), and this time was not billed for.)    Eval: OT provides HEP for wrist and forearm tightness and to increase wrist/carpal stability as well as work on thumb grinding/tightness.  Exercises (and activities like Dart Thrower's Motion) listed below were individually educated on, first OT did manually or demo'd, then pt demo's back well for all- none increased pain levels.    Exercises - Wrist Pronation Stretch  - 4-6 x daily - 1 sets - 10-15 reps - Seated Wrist Flexion with Overpressure  - 3-4 x daily - 3-5 reps - 15 sec hold - Wrist Extension Stretch Pronated  - 3-4 x daily - 3-5 reps - 15 hold - Wrist Prayer Stretch  - 3-4 x daily - 3-5 reps - 15 sec hold - Seated  Thumb MP Flexion PROM  - 4-6 x daily - 1 sets - 10-15 reps - Wrist AROM Dart Throwers Motion  - 3-4 x daily - 1-2 sets - 10-15 reps - Wrist AROM Dart Throwers Motion with Resistance  - 2-3 x daily - 5-10 reps     PATIENT EDUCATION: Education details: See tx section above for details  Person educated: Patient Education method: Engineer, structuralVerbal Instruction, Teach back, Handouts  Education comprehension: States and demonstrates understanding, Additional Education required      HOME EXERCISE PROGRAM: Access Code: [email or web address] URL: [email or web address] Prepared by: Fannie KneeNathanael Charlestine Rookstool     GOALS: Goals reviewed with patient? Yes     SHORT TERM GOALS: (STG required if POC>30 days)   Pt  will demo/state understanding of initial HEP to improve pain levels and prerequisite motion. Target date: 10/20/21 Goal status: INITIAL     LONG TERM GOALS:   Pt will improve functional ability by decreased impairment per PSFS assessment from 7 to 8.5 or better, for better quality of life. Target date: 11/03/21 Goal status: INITIAL   2.  Pt will improve grip strength in right dom hand from 20lbs to at least 40lbs for functional use at home and in IADLs. Target date: 11/03/21 Goal status: INITIAL   3.  Pt will improve A/ROM in right wrist flex/ext from 62/50 to at least 65/60, to have better functional motion for tasks like reach and grasp.  Target date: 11/03/21 Goal status: INITIAL   4.  Pt will improve strength in right wrist flexion to at least 4+/5 MMT to have increased functional ability to carry out selfcare and higher-level homecare tasks with no difficulty. Target date: 11/03/21 Goal status: INITIAL   5.  Pt will decrease pain at worst from 6-7/10 to 2-3/10 or better to have better sleep and occupational participation in daily roles. Target date: 11/03/21 Goal status: INITIAL     ASSESSMENT:   CLINICAL IMPRESSION: 10/19/21: She c/o more of finger joint pain today, which is different from  evaluation, but she does have inflamed Bouchard and Heberden's nodules, showing OA. OT helps to manage this and it could be linked to pain/tightness in wrist as well.  She needs to be more active with HEP as recommended if she wants to have relief.  She was recommended to come mor ethan 1xweek due to increased pain.   Eval: Patient is a 51 y.o. female who was seen today for occupational therapy evaluation for right wrist pain and decreased functional ability.      PLAN: OT FREQUENCY: 1-2x/week    OT DURATION: 3 weeks (through 11/03/21)   PLANNED INTERVENTIONS: therapeutic exercise, therapeutic activity, neuromuscular re-education, manual therapy, passive range of motion, splinting, ultrasound, fluidotherapy, compression bandaging, moist heat, cryotherapy, patient/family education, and coping strategies training   RECOMMENDED OTHER SERVICES: none now    CONSULTED AND AGREED WITH PLAN OF CARE: Patient   PLAN FOR NEXT SESSION:  Check HEP for wrist and finger pain management, check use of soft orthosis, start light hand strength if not painful/ if tolerated.    Fannie Knee, OTR/L, CHT 01/18/2022, 11:47 AM    OCCUPATIONAL THERAPY DISCHARGE SUMMARY  Visits from Start of Care: 2  She did not return to therapy after her 10/19/21 visit and progress could not be recorded/goals cold not be addressed. She is discharged at this point.  See notes for any details.   Fannie Knee, OTR/L, CHT 01/18/22
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
